# Patient Record
Sex: Female | Born: 1965 | Race: White | Hispanic: No | Marital: Married | Smoking: Former smoker
Health system: Southern US, Community
[De-identification: ages and names within clinical notes are randomized; demographics above are authoritative.]

## PROBLEM LIST (undated history)

## (undated) DIAGNOSIS — K219 Gastro-esophageal reflux disease without esophagitis: Secondary | ICD-10-CM

## (undated) DIAGNOSIS — F419 Anxiety disorder, unspecified: Secondary | ICD-10-CM

## (undated) DIAGNOSIS — S129XXA Fracture of neck, unspecified, initial encounter: Secondary | ICD-10-CM

## (undated) DIAGNOSIS — I10 Essential (primary) hypertension: Secondary | ICD-10-CM

## (undated) DIAGNOSIS — N83209 Unspecified ovarian cyst, unspecified side: Secondary | ICD-10-CM

## (undated) HISTORY — DX: Gastro-esophageal reflux disease without esophagitis: K21.9

## (undated) HISTORY — DX: Anxiety disorder, unspecified: F41.9

## (undated) HISTORY — PX: TUBAL LIGATION: SHX77

## (undated) HISTORY — PX: COLONOSCOPY: SHX174

## (undated) HISTORY — PX: ESOPHAGOGASTRODUODENOSCOPY: SHX1529

## (undated) HISTORY — DX: Fracture of neck, unspecified, initial encounter: S12.9XXA

## (undated) HISTORY — PX: CARPAL TUNNEL RELEASE: SHX101

## (undated) HISTORY — PX: OTHER SURGICAL HISTORY: SHX169

## (undated) HISTORY — DX: Unspecified ovarian cyst, unspecified side: N83.209

---

## 1994-07-07 LAB — HM DEXA SCAN

## 2009-02-12 ENCOUNTER — Encounter: Admission: RE | Admit: 2009-02-12 | Discharge: 2009-02-12 | Payer: Self-pay | Admitting: Family Medicine

## 2011-05-19 DIAGNOSIS — I729 Aneurysm of unspecified site: Secondary | ICD-10-CM | POA: Insufficient documentation

## 2011-07-29 ENCOUNTER — Emergency Department (INDEPENDENT_AMBULATORY_CARE_PROVIDER_SITE_OTHER)
Admission: EM | Admit: 2011-07-29 | Discharge: 2011-07-29 | Disposition: A | Discharge status: Home / Self Care | Payer: BC Managed Care – PPO | Source: Home / Self Care | Attending: Emergency Medicine | Admitting: Emergency Medicine

## 2011-07-29 ENCOUNTER — Encounter: Payer: Self-pay | Admitting: *Deleted

## 2011-07-29 DIAGNOSIS — J069 Acute upper respiratory infection, unspecified: Secondary | ICD-10-CM

## 2011-07-29 HISTORY — DX: Essential (primary) hypertension: I10

## 2011-07-29 MED ORDER — OSELTAMIVIR PHOSPHATE 75 MG PO CAPS: 75.0000 mg | ORAL_CAPSULE | Freq: Two times a day (BID) | ORAL | Status: AC

## 2011-07-29 NOTE — ED Notes (Signed)
Pt c/o fatigue, low grade fever, and chills x 1 day. She did not receive a flu vaccine. 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

## 2011-07-29 NOTE — ED Provider Notes (Signed)
History     CSN: [id number]  Arrival date & time 07/29/11  1713   First MD Initiated Contact with Patient 07/29/11 1716      No chief complaint on file.   (Consider location/radiation/quality/duration/timing/severity/associated sxs/prior treatment) HPI Emily Ponce is a 46 y.o. female who complains of onset of cold symptoms for 1 days.  No sore throat +cough No pleuritic pain No wheezing + nasal congestion No post-nasal drainage No sinus pain/pressure No chest congestion No itchy/red eyes No earache No hemoptysis No SOB + chills/sweats + fever No nausea No vomiting No abdominal pain No diarrhea No skin rashes + fatigue + myalgias No headache    No past medical history on file.  No past surgical history on file.  No family history on file.  History  Substance Use Topics  . Smoking status: Not on file  . Smokeless tobacco: Not on file  . Alcohol Use: Not on file    OB History    No data available      Review of Systems  Allergies  Review of patient's allergies indicates not on file.  Home Medications  No current outpatient prescriptions on file.  There were no vitals taken for this visit.  Physical Exam  Nursing note and vitals reviewed. Constitutional: She is oriented to person, place, and time. She appears well-developed and well-nourished.  HENT:  Head: Normocephalic and atraumatic.  Right Ear: Tympanic membrane, external ear and ear canal normal.  Left Ear: Tympanic membrane, external ear and ear canal normal.  Nose: Mucosal edema and rhinorrhea present.  Mouth/Throat: Posterior oropharyngeal erythema present. No oropharyngeal exudate or posterior oropharyngeal edema.  Eyes: No scleral icterus.  Neck: Neck supple.  Cardiovascular: Regular rhythm and normal heart sounds.   Pulmonary/Chest: Effort normal and breath sounds normal. No respiratory distress.  Neurological: She is alert and oriented to person, place, and time.  Skin: Skin is warm and  dry.  Psychiatric: She has a normal mood and affect. Her speech is normal.    ED Course  Procedures (including critical care time)  Labs Reviewed - No data to display No results found.   No diagnosis found.    MDM  1)  Tamiflu but advised her that it likely will not work. I also gave her a note for work tomorrow. 2)  Use nasal saline solution (over the counter) at least 3 times a day. 3)  Use over the counter decongestants like Zyrtec-D every 12 hours as needed to help with congestion.  If you have hypertension, do not take medicines with sudafed.  4)  Can take tylenol every 6 hours or motrin every 8 hours for pain or fever. 5)  Follow up with your primary doctor if no improvement in 5-7 days, sooner if increasing pain, fever, or new symptoms.     Lily Kocher, MD 07/29/11 [phone]

## 2012-07-17 DIAGNOSIS — G43909 Migraine, unspecified, not intractable, without status migrainosus: Secondary | ICD-10-CM | POA: Insufficient documentation

## 2013-01-08 ENCOUNTER — Emergency Department (INDEPENDENT_AMBULATORY_CARE_PROVIDER_SITE_OTHER): Payer: BC Managed Care – PPO

## 2013-01-08 ENCOUNTER — Emergency Department
Admission: EM | Admit: 2013-01-08 | Discharge: 2013-01-08 | Disposition: A | Discharge status: Home / Self Care | Payer: BC Managed Care – PPO | Source: Home / Self Care | Attending: Family Medicine | Admitting: Family Medicine

## 2013-01-08 DIAGNOSIS — M76829 Posterior tibial tendinitis, unspecified leg: Secondary | ICD-10-CM

## 2013-01-08 DIAGNOSIS — L089 Local infection of the skin and subcutaneous tissue, unspecified: Secondary | ICD-10-CM

## 2013-01-08 DIAGNOSIS — M7661 Achilles tendinitis, right leg: Secondary | ICD-10-CM

## 2013-01-08 DIAGNOSIS — M7660 Achilles tendinitis, unspecified leg: Secondary | ICD-10-CM

## 2013-01-08 DIAGNOSIS — M76821 Posterior tibial tendinitis, right leg: Secondary | ICD-10-CM

## 2013-01-08 DIAGNOSIS — M79609 Pain in unspecified limb: Secondary | ICD-10-CM

## 2013-01-08 MED ORDER — MELOXICAM 15 MG PO TABS: 15.0000 mg | ORAL_TABLET | Freq: Every day | ORAL | Status: DC

## 2013-01-08 MED ORDER — HYDROCODONE-ACETAMINOPHEN 5-325 MG PO TABS: ORAL_TABLET | ORAL | Status: DC

## 2013-01-08 MED ORDER — DOXYCYCLINE HYCLATE 100 MG PO CAPS: 100.0000 mg | ORAL_CAPSULE | Freq: Two times a day (BID) | ORAL | Status: DC

## 2013-01-08 NOTE — ED Provider Notes (Signed)
History    CSN: [id number] Arrival date & time 01/08/13  0935  First MD Initiated Contact with Patient 01/08/13 1030     Chief Complaint  Patient presents with  . Foot Pain    x 1 day  . Insect Bite    x 1 month ago     HPI Comments: Patient presents with two complaints: 1)  She used a push mower to [employer] a large lawn yesterday and afterwards noted pain in her right foot arch; the pain radiates into her right medial leg. 2)  About a month ago she had an insect bite to her left shoulder.  There remains a tender bump at the bite site.  No fevers, chills, and sweats.  She feels well otherwise.  Patient is a 47 y.o. female presenting with lower extremity pain. The history is provided by the patient.  Foot Pain This is a new problem. The current episode started yesterday. The problem occurs constantly. The problem has been gradually worsening. Associated symptoms comments: nne. The symptoms are aggravated by walking. Nothing relieves the symptoms. Treatments tried: NSAIDs. The treatment provided no relief.   Past Medical History  Diagnosis Date  . Hypertension    History reviewed. No pertinent past surgical history. Family History  Problem Relation Age of Onset  . Heart attack Mother   . Alzheimer's disease Father    History  Substance Use Topics  . Smoking status: Former Games developer  . Smokeless tobacco: Not on file  . Alcohol Use: Yes   OB History   Grav Para Term Preterm Abortions TAB SAB Ect Mult Living                 Review of Systems  All other systems reviewed and are negative.    Allergies  Review of patient's allergies indicates no known allergies.  Home Medications   Current Outpatient Rx  Name  Route  Sig  Dispense  Refill  . atenolol (TENORMIN) 50 MG tablet   Oral   Take 50 mg by mouth daily.         . hydrochlorothiazide (HYDRODIURIL) 25 MG tablet   Oral   Take 25 mg by mouth daily.         Marland Kitchen doxycycline (VIBRAMYCIN) 100 MG capsule   Oral   Take  1 capsule (100 mg total) by mouth 2 (two) times daily.   14 capsule   0   . meloxicam (MOBIC) 15 MG tablet   Oral   Take 1 tablet (15 mg total) by mouth daily. Take with food each morning   15 tablet   0    BP 106/69  Pulse 70  Temp(Src) 98.1 F (36.7 C) (Oral)  Ht 5\' 5"  (1.651 m)  Wt 150 lb (68.04 kg)  BMI 24.96 kg/m2  SpO2 98% Physical Exam  Nursing note and vitals reviewed. Constitutional: She is oriented to person, place, and time. She appears well-developed and well-nourished. No distress.  HENT:  Head: Normocephalic and atraumatic.  Mouth/Throat: Oropharynx is clear and moist.  Eyes: Conjunctivae are normal. Pupils are equal, round, and reactive to light.  Cardiovascular: Normal heart sounds.   Pulmonary/Chest: Breath sounds normal.  Musculoskeletal: Normal range of motion. She exhibits tenderness.       Right foot: She exhibits tenderness and bony tenderness. She exhibits normal range of motion, no swelling, normal capillary refill, no crepitus, no deformity and no laceration.       Feet:  Right foot/ankle has tenderness  in the medial arch area as note on diagram.  There is tenderness just posterior to the medial malleolus, and tenderness over insertion of achilles tendon  Lymphadenopathy:    She has no cervical adenopathy.  Neurological: She is alert and oriented to person, place, and time.  Skin: Skin is warm and dry. Lesion noted.     On the left shoulder over deltoid is an 8mm erythematous, slightly raised indurated lesion that is not fluctuant.  Minimal tenderness.      ED Course  Procedures  none  Dg Foot Complete Right  01/08/2013   *RADIOLOGY REPORT*  Clinical Data: Foot pain with tenderness in the medial margin of the foot.  RIGHT FOOT COMPLETE - 3+ VIEW  Comparison: None.  Findings: Minimal spurring laterally of the first metatarsal head. Accessory navicular noted.  No malalignment at the Lisfranc joint. No fracture observed.  Accessory ossification  center noted dorsal lateral to the base of the first metatarsal.  IMPRESSION:  1.  No acute findings.  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  If symptoms persist despite conservative therapy, MRI followup may be warranted.   Original Report Authenticated By: Gaylyn Rong, M.D.   1. Insect bite of shoulder, infected, left, initial encounter   2. Posterior tibial tendonitis, right   3. Achilles tendonitis, right     MDM  Begin doxycycline for insect bite cellulitis Begin Mobic.  Rx for Lortab at bedtime. Wear well-fitting shoes with arch supports.  Apply ice pack several times daily.  Begin range of motion and stretching exercises as per instruction sheets ([facility] information and instruction handout given)  Followup with [facility] if not improving about two weeks.   Lattie Haw, MD 01/13/13 [phone]

## 2013-01-08 NOTE — ED Notes (Signed)
Emily Ponce was mowing her yard yesterday and her right foot began to hurt. She took Aleve and later Advil without relief. Denies injury.   She had a insect bite on her left shoulder 1 month ago and the area is still bothering her.

## 2013-01-13 ENCOUNTER — Telehealth: Payer: Self-pay | Admitting: Emergency Medicine

## 2013-12-26 ENCOUNTER — Emergency Department (HOSPITAL_BASED_OUTPATIENT_CLINIC_OR_DEPARTMENT_OTHER): Payer: BC Managed Care – PPO

## 2013-12-26 ENCOUNTER — Emergency Department
Admission: EM | Admit: 2013-12-26 | Discharge: 2013-12-26 | Disposition: A | Discharge status: Home / Self Care | Payer: BC Managed Care – PPO | Source: Home / Self Care | Attending: Emergency Medicine | Admitting: Emergency Medicine

## 2013-12-26 ENCOUNTER — Emergency Department (INDEPENDENT_AMBULATORY_CARE_PROVIDER_SITE_OTHER): Payer: BC Managed Care – PPO

## 2013-12-26 ENCOUNTER — Encounter: Payer: Self-pay | Admitting: Emergency Medicine

## 2013-12-26 DIAGNOSIS — S60212A Contusion of left wrist, initial encounter: Secondary | ICD-10-CM

## 2013-12-26 DIAGNOSIS — M79609 Pain in unspecified limb: Secondary | ICD-10-CM

## 2013-12-26 DIAGNOSIS — M25539 Pain in unspecified wrist: Secondary | ICD-10-CM

## 2013-12-26 DIAGNOSIS — S60222A Contusion of left hand, initial encounter: Secondary | ICD-10-CM

## 2013-12-26 DIAGNOSIS — S60219A Contusion of unspecified wrist, initial encounter: Secondary | ICD-10-CM

## 2013-12-26 DIAGNOSIS — S6000XA Contusion of unspecified finger without damage to nail, initial encounter: Secondary | ICD-10-CM

## 2013-12-26 MED ORDER — MELOXICAM 7.5 MG PO TABS
ORAL_TABLET | ORAL | Status: DC
Start: 2013-12-26 — End: 2014-04-24

## 2013-12-26 MED ORDER — HYDROCODONE-ACETAMINOPHEN 5-325 MG PO TABS: 1.0000 | ORAL_TABLET | ORAL | Status: DC | PRN

## 2013-12-26 NOTE — ED Notes (Signed)
Pt c/o LT hand pain and swelling after typing more than usual at work x 3 days ago, then helping move her washing machine at home x 2 days ago. She took Aleve over the weekend with no relief.

## 2013-12-26 NOTE — ED Provider Notes (Signed)
CSN: [id number]     Arrival date & time 12/26/13  0623 History   First MD Initiated Contact with Patient 12/26/13 0825     Chief Complaint  Patient presents with  . Hand Pain  Pt c/o LT hand pain and swelling after typing more than usual at work x 3 days ago, then helping move her washing machine at home x 2 days ago. She took Aleve over the weekend with no relief.   Patient is a 48 y.o. female presenting with hand injury. The history is provided by the patient.  Hand Injury Location:  Hand Injury: yes   Hand location:  L hand Pain details:    Quality:  Sharp, dull and shooting   Radiates to: Left wrist.   Severity:  Severe   Progression:  Unchanged Chronicity:  New (History of left carpal tunnel surgery in the past) Relieved by:  Rest Worsened by:  Movement Ineffective treatments: Aleve. Associated symptoms: decreased range of motion and swelling   Associated symptoms: no back pain, no fever, no muscle weakness, no neck pain, no numbness and no tingling     Past Medical History  Diagnosis Date  . Hypertension    Past Surgical History  Procedure Laterality Date  . Carpal tunnel release Left   . Esure      Family History  Problem Relation Age of Onset  . Heart attack Mother   . Hypertension Mother   . Alzheimer's disease Father   . Hypertension Father    History  Substance Use Topics  . Smoking status: Former Research scientist (life sciences)  . Smokeless tobacco: Not on file  . Alcohol Use: Yes   OB History   Grav Para Term Preterm Abortions TAB SAB Ect Mult Living                 Review of Systems  Constitutional: Negative for fever.  Musculoskeletal: Negative for back pain and neck pain.  All other systems reviewed and are negative.   Allergies  Review of patient's allergies indicates no known allergies.  Home Medications   Prior to Admission medications   Medication Sig Start Date End Date Taking? Authorizing Provider  atenolol (TENORMIN) 50 MG tablet Take 50 mg by mouth  daily.    Historical Provider, MD  hydrochlorothiazide (HYDRODIURIL) 25 MG tablet Take 25 mg by mouth daily.    Historical Provider, MD  HYDROcodone-acetaminophen (NORCO/VICODIN) 5-325 MG per tablet Take 1-2 tablets by mouth every 4 (four) hours as needed for severe pain. Take with food. 12/26/13   Jacqulyn Cane, MD  meloxicam (MOBIC) 7.5 MG tablet Take 1 twice a day as needed for pain. Take with food. (Do not take with any other NSAID.) 12/26/13   Jacqulyn Cane, MD   BP 129/77  Pulse 65  Temp(Src) 98 F (36.7 C) (Oral)  Resp 16  Ht 5\' 5"  (1.651 m)  Wt 158 lb (71.668 kg)  BMI 26.29 kg/m2  SpO2 100%  LMP 12/19/2013 Physical Exam  Nursing note and vitals reviewed. Constitutional: She is oriented to person, place, and time. She appears well-developed and well-nourished. No distress.  HENT:  Head: Normocephalic and atraumatic.  Eyes: Conjunctivae and EOM are normal. Pupils are equal, round, and reactive to light. No scleral icterus.  Neck: Normal range of motion.  Cardiovascular: Normal rate.   Pulmonary/Chest: Effort normal.  Abdominal: She exhibits no distension.  Musculoskeletal:       Left wrist: She exhibits decreased range of motion and bony tenderness.  Left hand: She exhibits decreased range of motion, bony tenderness and swelling. She exhibits normal capillary refill and no laceration. Normal sensation noted. Normal strength noted.       Hands: Left hand and wrist: Mild swelling, severe tenderness as depicted. Minimal bruising. Pain exacerbated by flexion. Tendons intact.  Tinel's sign mildly positive. Phalen's sign mildly positive . Negative Flick test.  Radial pulse normal. No tenderness left thumb or radial aspect of hand/wrist.  Neurological: She is alert and oriented to person, place, and time.  Skin: Skin is warm.  Psychiatric: She has a normal mood and affect.    ED Course  Procedures (including critical care time) Labs Review Labs Reviewed - No data to  display  Imaging Review Dg Wrist Complete Left  12/26/2013   CLINICAL DATA:  Left wrist and hand pain.  EXAM: LEFT WRIST - COMPLETE 3+ VIEW  COMPARISON:  Hand series performed today  FINDINGS: There is no evidence of fracture or dislocation. There is no evidence of arthropathy or other focal bone abnormality. Soft tissues are unremarkable.  IMPRESSION: Negative.   Electronically Signed   By: Rolm Baptise M.D.   On: 12/26/2013 09:13   Dg Hand Complete Left  12/26/2013   CLINICAL DATA:  Left wrist and hand pain.  EXAM: LEFT HAND - COMPLETE 3+ VIEW  COMPARISON:  Wrist series performed today.  FINDINGS: There is no evidence of fracture or dislocation. There is no evidence of arthropathy or other focal bone abnormality. Soft tissues are unremarkable.  IMPRESSION: Negative.   Electronically Signed   By: Rolm Baptise M.D.   On: 12/26/2013 09:12     MDM   1. Contusion, wrist, left, initial encounter   2. Contusion of left hand, initial encounter    Reviewed with patient that x-rays of left wrist and left hand are within normal limits. No acute abnormalities. No fracture or dislocation. Clinically, she likely has contusion and sprain of left wrist and left hand, likely from injury 2 days ago while she was helping move a washing machine at home. She has a history of left carpal tunnel surgery 2 years ago, and residual scar tissue may be a factor. She does not have definitive physical findings for diagnosis of carpal tunnel syndrome.  Treatment options discussed, as well as risks, benefits, alternatives. Patient voiced understanding and agreement with the following plans: Ace bandage applied left hand and wrist. Left wrist splint applied . No lifting with left hand or wrist for one week. Her job requires keyboarding frequently, so I advised limiting keyboarding for the next week. Prescribed Mobic for moderate pain. Vicodin when necessary severe pain. Follow-up with your orthopedist in 5-7 days if  not improving, or sooner if symptoms become worse. Precautions discussed. Red flags discussed. Questions invited and answered. Patient voiced understanding and agreement.     Jacqulyn Cane, MD 12/26/13 1013

## 2014-01-20 DIAGNOSIS — M542 Cervicalgia: Secondary | ICD-10-CM

## 2014-01-20 DIAGNOSIS — G8929 Other chronic pain: Secondary | ICD-10-CM | POA: Insufficient documentation

## 2014-04-24 ENCOUNTER — Ambulatory Visit (INDEPENDENT_AMBULATORY_CARE_PROVIDER_SITE_OTHER): Payer: BC Managed Care – PPO | Admitting: Family Medicine

## 2014-04-24 ENCOUNTER — Encounter: Payer: Self-pay | Admitting: Family Medicine

## 2014-04-24 VITALS — BP 122/76 | HR 67 | Ht 65.0 in | Wt 160.0 lb

## 2014-04-24 DIAGNOSIS — I10 Essential (primary) hypertension: Secondary | ICD-10-CM | POA: Insufficient documentation

## 2014-04-24 DIAGNOSIS — F411 Generalized anxiety disorder: Secondary | ICD-10-CM | POA: Insufficient documentation

## 2014-04-24 DIAGNOSIS — R519 Headache, unspecified: Secondary | ICD-10-CM | POA: Insufficient documentation

## 2014-04-24 DIAGNOSIS — I671 Cerebral aneurysm, nonruptured: Secondary | ICD-10-CM

## 2014-04-24 DIAGNOSIS — R51 Headache: Secondary | ICD-10-CM

## 2014-04-24 DIAGNOSIS — M542 Cervicalgia: Secondary | ICD-10-CM

## 2014-04-24 DIAGNOSIS — G8929 Other chronic pain: Secondary | ICD-10-CM | POA: Insufficient documentation

## 2014-04-24 MED ORDER — ALPRAZOLAM 0.5 MG PO TABS: 0.2500 mg | ORAL_TABLET | Freq: Every evening | ORAL | Status: DC | PRN

## 2014-04-24 MED ORDER — HYDROCODONE-ACETAMINOPHEN 5-325 MG PO TABS: 1.0000 | ORAL_TABLET | ORAL | Status: DC | PRN
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
## 2014-05-23 ENCOUNTER — Ambulatory Visit (INDEPENDENT_AMBULATORY_CARE_PROVIDER_SITE_OTHER): Payer: BC Managed Care – PPO | Admitting: Family Medicine

## 2014-05-23 ENCOUNTER — Encounter: Payer: Self-pay | Admitting: Family Medicine

## 2014-05-23 VITALS — BP 112/69 | HR 70 | Temp 97.7°F | Wt 161.0 lb

## 2014-05-23 DIAGNOSIS — G8929 Other chronic pain: Secondary | ICD-10-CM

## 2014-05-23 DIAGNOSIS — H6981 Other specified disorders of Eustachian tube, right ear: Secondary | ICD-10-CM | POA: Diagnosis not present

## 2014-05-23 DIAGNOSIS — M542 Cervicalgia: Secondary | ICD-10-CM | POA: Diagnosis not present

## 2014-05-23 MED ORDER — PREDNISONE 20 MG PO TABS: ORAL_TABLET | ORAL | Status: AC

## 2014-05-23 MED ORDER — HYDROCODONE-ACETAMINOPHEN 5-325 MG PO TABS: 1.0000 | ORAL_TABLET | ORAL | Status: DC | PRN

## 2014-05-23 NOTE — Progress Notes (Signed)
CC: Emily Ponce is a 48 y.o. female is here for Ear Pain and requests refill of hydrocodone   Subjective: HPI:  Complains of right ear pain that came on suddenly while descending on airplane. Pain was sharp, 10 out of 10 in severity and nonradiating. Since then she's had mild discomfort in the right ear with decreased subjective hearing. She also describes congestion in the right ear and a bubbling sensation in the ear when opening and closing her jaw. No interventions as of yet. She's never had this before. She denies any other motor or sensory disturbances. Currently nothing particularly makes it better or worse. Denies nasal congestion sinus pressure, sore throat, fevers or chills  Requesting refills of hydrocodone for her chronic neck pain. She denies any new radiation to the pain. Denies any character frequency or severity change to the pain. It is still localized in the midline of the middle of the neck and described today as a stiffness that is completely alleviated by hydrocodone   Review Of Systems Outlined In HPI  Past Medical History  Diagnosis Date  . Hypertension     Past Surgical History  Procedure Laterality Date  . Carpal tunnel release Left   . Esure      Family History  Problem Relation Age of Onset  . Heart attack Mother   . Hypertension Mother   . Alzheimer's disease Father   . Hypertension Father     History   Social History  . Marital Status: Married    Spouse Name: N/A    Number of Children: N/A  . Years of Education: N/A   Occupational History  . Not on file.   Social History Main Topics  . Smoking status: Former Smoker    Quit date: 04/24/2010  . Smokeless tobacco: Not on file  . Alcohol Use: Yes     Comment: occasional  . Drug Use: No  . Sexual Activity:    Partners: Male   Other Topics Concern  . Not on file   Social History Narrative     Objective: BP 112/69 mmHg  Pulse 70  Temp(Src) 97.7 F (36.5 C) (Oral)  Wt 161 lb  (73.029 kg)  LMP 03/27/2014  General: Alert and Oriented, No Acute Distress HEENT: Pupils equal, round, reactive to light. Conjunctivae clear.  External ears unremarkable, canals clear with intact TMs with appropriate landmarks. left Middle ear appears open without effusion right middle ear has a trace amount of blood in the 9:00 position and a mild serous effusion. Pink inferior turbinates.  Moist mucous membranes, pharynx without inflammation nor lesions.  Neck supple without palpable lymphadenopathy nor abnormal masses. Lungs: clear andcomfortable work of breathing  Extremities: No peripheral edema.  Strong peripheral pulses.  Mental Status: No depression, anxiety, nor agitation. Skin: Warm and dry. Rinne test normal bilaterally, Weber test lateralizes to the right  Assessment & Plan: Emily Ponce was seen today for ear pain and requests refill of hydrocodone.  Diagnoses and associated orders for this visit:  Chronic neck pain - HYDROcodone-acetaminophen (NORCO/VICODIN) 5-325 MG per tablet; Take 1-2 tablets by mouth every 4 (four) hours as needed for severe pain. Take with food.  Eustachian tube dysfunction, right - predniSONE (DELTASONE) 20 MG tablet; Three tabs at once daily for five days.    Chronic neck pain: Controlled continue as needed Vicodin Eustachian tube dysfunction: Suspect a sudden pressure change with already present eustachian tube dysfunction caused her severe pain. Begin prednisone for 5 days and if no  better on Monday call for referral to ear nose and throat  No Follow-up on file.

## 2014-06-22 ENCOUNTER — Other Ambulatory Visit: Payer: Self-pay | Admitting: *Deleted

## 2014-06-22 DIAGNOSIS — M542 Cervicalgia: Principal | ICD-10-CM

## 2014-06-22 DIAGNOSIS — G8929 Other chronic pain: Secondary | ICD-10-CM

## 2014-06-22 LAB — HM COLONOSCOPY

## 2014-06-22 MED ORDER — HYDROCODONE-ACETAMINOPHEN 5-325 MG PO TABS: 1.0000 | ORAL_TABLET | ORAL | Status: DC | PRN

## 2014-06-27 ENCOUNTER — Encounter: Payer: Self-pay | Admitting: Family Medicine

## 2014-07-06 ENCOUNTER — Encounter: Payer: Self-pay | Admitting: Family Medicine

## 2014-07-06 DIAGNOSIS — Z299 Encounter for prophylactic measures, unspecified: Secondary | ICD-10-CM | POA: Insufficient documentation

## 2014-07-25 ENCOUNTER — Encounter: Payer: Self-pay | Admitting: Family Medicine

## 2014-07-25 ENCOUNTER — Ambulatory Visit (INDEPENDENT_AMBULATORY_CARE_PROVIDER_SITE_OTHER): Payer: BLUE CROSS/BLUE SHIELD | Admitting: Family Medicine

## 2014-07-25 VITALS — BP 112/63 | Ht 65.0 in | Wt 164.0 lb

## 2014-07-25 DIAGNOSIS — G8929 Other chronic pain: Secondary | ICD-10-CM

## 2014-07-25 DIAGNOSIS — M542 Cervicalgia: Secondary | ICD-10-CM

## 2014-07-25 DIAGNOSIS — Z23 Encounter for immunization: Secondary | ICD-10-CM | POA: Diagnosis not present

## 2014-07-25 DIAGNOSIS — Z0000 Encounter for general adult medical examination without abnormal findings: Secondary | ICD-10-CM

## 2014-07-25 LAB — COMPLETE METABOLIC PANEL WITH GFR
ALBUMIN: 4.2 g/dL (ref 3.5–5.2)
ALK PHOS: 54 U/L (ref 39–117)
ALT: 19 U/L (ref 0–35)
AST: 18 U/L (ref 0–37)
BUN: 12 mg/dL (ref 6–23)
CHLORIDE: 101 meq/L (ref 96–112)
CO2: 28 mEq/L (ref 19–32)
Calcium: 9.3 mg/dL (ref 8.4–10.5)
Creat: 0.8 mg/dL (ref 0.50–1.10)
GFR, Est Non African American: 86 mL/min
Glucose, Bld: 88 mg/dL (ref 70–99)
Potassium: 4.2 mEq/L (ref 3.5–5.3)
SODIUM: 139 meq/L (ref 135–145)
TOTAL PROTEIN: 7 g/dL (ref 6.0–8.3)
Total Bilirubin: 0.5 mg/dL (ref 0.2–1.2)

## 2014-07-25 LAB — LIPID PANEL
Cholesterol: 193 mg/dL (ref 0–200)
HDL: 51 mg/dL (ref >39)
LDL CALC: 116 mg/dL — AB (ref 0–99)
Total CHOL/HDL Ratio: 3.8 Ratio
Triglycerides: 128 mg/dL (ref <150)
VLDL: 26 mg/dL (ref 0–40)

## 2014-07-25 LAB — CBC
HCT: 41.1 % (ref 36.0–46.0)
Hemoglobin: 14.1 g/dL (ref 12.0–15.0)
MCH: 31.8 pg (ref 26.0–34.0)
MCHC: 34.3 g/dL (ref 30.0–36.0)
MCV: 92.8 fL (ref 78.0–100.0)
MPV: 10.8 fL (ref 8.6–12.4)
PLATELETS: 238 10*3/uL (ref 150–400)
RBC: 4.43 MIL/uL (ref 3.87–5.11)
RDW: 13 % (ref 11.5–15.5)
WBC: 7.5 10*3/uL (ref 4.0–10.5)

## 2014-07-25 LAB — TSH: TSH: 1.555 u[IU]/mL (ref 0.350–4.500)

## 2014-07-25 MED ORDER — HYDROCODONE-ACETAMINOPHEN 5-325 MG PO TABS: 1.0000 | ORAL_TABLET | ORAL | Status: DC | PRN

## 2014-07-25 NOTE — Patient Instructions (Signed)
Dr. Alazne Quant's General Advice Following Your Complete Physical Exam  The Benefits of Regular Exercise: Unless you suffer from an uncontrolled cardiovascular condition, studies strongly suggest that regular exercise and physical activity will add to both the quality and length of your life.  The World Health Organization recommends 150 minutes of moderate intensity aerobic activity every week.  This is best split over 3-4 days a week, and can be as simple as a brisk walk for just over 35 minutes "most days of the week".  This type of exercise has been shown to lower LDL-Cholesterol, lower average blood sugars, lower blood pressure, lower cardiovascular disease risk, improve memory, and increase one's overall sense of wellbeing.  The addition of anaerobic (or "strength training") exercises offers additional benefits including but not limited to increased metabolism, prevention of osteoporosis, and improved overall cholesterol levels.  How Can I Strive For A Low-Fat Diet?: Current guidelines recommend that 25-35 percent of your daily energy (food) intake should come from fats.  One might ask how can this be achieved without having to dissect each meal on a daily basis?  Switch to skim or 1% milk instead of whole milk.  Focus on lean meats such as ground turkey, fresh fish, baked chicken, and lean cuts of beef as your source of dietary protein.  Limit saturated fat consumption to less than 10% of your daily caloric intake.  Limit trans fatty acid consumption primarily by limiting synthetic trans fats such as partially hydrogenated oils (Ex: fried fast foods).  Substitute olive or vegetable oil for solid fats where possible.  Moderation of Salt Intake: Provided you don't carry a diagnosis of congestive heart failure nor renal failure, I recommend a daily allowance of no more than 2300 mg of salt (sodium).  Keeping under this daily goal is associated with a decreased risk of cardiovascular events, creeping  above it can lead to elevated blood pressures and increases your risk of cardiovascular events.  Milligrams (mg) of salt is listed on all nutrition labels, and your daily intake can add up faster than you think.  Most canned and frozen dinners can pack in over half your daily salt allowance in one meal.    Lifestyle Health Risks: Certain lifestyle choices carry specific health risks.  As you may already know, tobacco use has been associated with increasing one's risk of cardiovascular disease, pulmonary disease, numerous cancers, among many other issues.  What you may not know is that there are medications and nicotine replacement strategies that can more than double your chances of successfully quitting.  I would be thrilled to help manage your quitting strategy if you currently use tobacco products.  When it comes to alcohol use, I've yet to find an "ideal" daily allowance.  Provided an individual does not have a medical condition that is exacerbated by alcohol consumption, general guidelines determine "safe drinking" as no more than two standard drinks for a man or no more than one standard drink for a female per day.  However, much debate still exists on whether any amount of alcohol consumption is technically "safe".  My general advice, keep alcohol consumption to a minimum for general health promotion.  If you or others believe that alcohol, tobacco, or recreational drug use is interfering with your life, I would be happy to provide confidential counseling regarding treatment options.  General "Over The Counter" Nutrition Advice: Postmenopausal women should aim for a daily calcium intake of 1200 mg, however a significant portion of this might already be   provided by diets including milk, yogurt, cheese, and other dairy products.  Vitamin D has been shown to help preserve bone density, prevent fatigue, and has even been shown to help reduce falls in the elderly.  Ensuring a daily intake of 800 Units of  Vitamin D is a good place to start to enjoy the above benefits, we can easily check your Vitamin D level to see if you'd potentially benefit from supplementation beyond 800 Units a day.  Folic Acid intake should be of particular concern to women of childbearing age.  Daily consumption of 400-800 mcg of Folic Acid is recommended to minimize the chance of spinal cord defects in a fetus should pregnancy occur.    For many adults, accidents still remain one of the most common culprits when it comes to cause of death.  Some of the simplest but most effective preventitive habits you can adopt include regular seatbelt use, proper helmet use, securing firearms, and regularly testing your smoke and carbon monoxide detectors.  Lakechia Nay B. Aquita Simmering DO [facility] [address], [address] [place],  [address] Phone: [phone]  

## 2014-07-25 NOTE — Progress Notes (Signed)
CC: Emily Ponce is a 49 y.o. female is here for Annual Exam   Subjective: HPI:  Colonoscopy: 2015 with clearance of 10 years Papsmear: 07/12/2012 repeat 2017 Mammogram: BI RADS 06 Jun 2014  Influenza Vaccine: declined Pneumovax: No current indication Td/Tdap: Overdue will receive booster today Zoster: (Start 49 yo)   Presents for complete physical exam her only acute complaint is irregular periods over the last 3 months. Anticipated December menses did not occur. She did have a period since January started however it was only 5 days long shorter than her typical 7 days of active bleeding. No other genitourinary complaints  Review of Systems - General ROS: negative for - chills, fever, night sweats, weight gain or weight loss Ophthalmic ROS: negative for - decreased vision Psychological ROS: negative for - anxiety or depression ENT ROS: negative for - hearing change, nasal congestion, tinnitus or allergies Hematological and Lymphatic ROS: negative for - bleeding problems, bruising or swollen lymph nodes Breast ROS: negative Respiratory ROS: no cough, shortness of breath, or wheezing Cardiovascular ROS: no chest pain or dyspnea on exertion Gastrointestinal ROS: no abdominal pain, change in bowel habits, or black or bloody stools Genito-Urinary ROS: negative for - genital discharge, genital ulcers, incontinence Musculoskeletal ROS: negative for - joint pain or muscle pain Neurological ROS: negative for - headaches or memory loss Dermatological ROS: negative for lumps, mole changes, rash and skin lesion changes  Past Medical History  Diagnosis Date  . Hypertension     Past Surgical History  Procedure Laterality Date  . Carpal tunnel release Left   . Esure      Family History  Problem Relation Age of Onset  . Heart attack Mother   . Hypertension Mother   . Alzheimer's disease Father   . Hypertension Father     History   Social History  . Marital Status: Married   Spouse Name: N/A    Number of Children: N/A  . Years of Education: N/A   Occupational History  . Not on file.   Social History Main Topics  . Smoking status: Former Smoker    Quit date: 04/24/2010  . Smokeless tobacco: Not on file  . Alcohol Use: Yes     Comment: occasional  . Drug Use: No  . Sexual Activity:    Partners: Male   Other Topics Concern  . Not on file   Social History Narrative     Objective: BP 112/63 mmHg  Ht 5\' 5"  (1.651 m)  Wt 164 lb (74.39 kg)  BMI 27.29 kg/m2  General: No Acute Distress HEENT: Atraumatic, normocephalic, conjunctivae normal without scleral icterus.  No nasal discharge, hearing grossly intact, TMs with good landmarks bilaterally with no middle ear abnormalities, posterior pharynx clear without oral lesions. Neck: Supple, trachea midline, no cervical nor supraclavicular adenopathy. Pulmonary: Clear to auscultation bilaterally without wheezing, rhonchi, nor rales. Cardiac: Regular rate and rhythm.  No murmurs, rubs, nor gallops. No peripheral edema.  2+ peripheral pulses bilaterally. Abdomen: Bowel sounds normal.  No masses.  Non-tender without rebound.  Negative Murphy's sign. GU: Declined  MSK: Grossly intact, no signs of weakness.  Full strength throughout upper and lower extremities.  Full ROM in upper and lower extremities.  No midline spinal tenderness. Neuro: Gait unremarkable, CN II-XII grossly intact.  C5-C6 Reflex 2/4 Bilaterally, L4 Reflex 2/4 Bilaterally.  Cerebellar function intact. Skin: No rashes. Psych: Alert and oriented to person/place/time.  Thought process normal. No anxiety/depression.   Assessment & Plan: Tayli was  seen today for annual exam.  Diagnoses and associated orders for this visit:  Annual physical exam - COMPLETE METABOLIC PANEL WITH GFR - Lipid panel - CBC - TSH  Chronic neck pain - HYDROcodone-acetaminophen (NORCO/VICODIN) 5-325 MG per tablet; Take 1-2 tablets by mouth every 4 (four) hours as needed  for severe pain. Take with food.   Healthy lifestyle interventions including but not limited to regular exercise, a healthy low fat diet, moderation of salt intake, the dangers of tobacco/alcohol/recreational drug use, nutrition supplementation, and accident avoidance were discussed with the patient and a handout was provided for future reference.  Checking TSH due to irregular menses but her period her that this is most likely due to perimenopause   Return in about 6 months (around 01/23/2015) for Blood Pressure Follow Up.

## 2014-07-25 NOTE — Addendum Note (Signed)
Addended by: Terance Hart on: 07/25/2014 09:41 AM   Modules accepted: Orders

## 2014-07-26 ENCOUNTER — Encounter: Payer: Self-pay | Admitting: Family Medicine

## 2014-07-26 DIAGNOSIS — E785 Hyperlipidemia, unspecified: Secondary | ICD-10-CM | POA: Insufficient documentation

## 2014-08-09 ENCOUNTER — Other Ambulatory Visit: Payer: Self-pay

## 2014-08-09 ENCOUNTER — Encounter: Payer: Self-pay | Admitting: Family Medicine

## 2014-08-09 NOTE — Telephone Encounter (Addendum)
Patient sent a MyChart message asking if for refills on her blood pressure medication. She wants 90 day sent to [facility] but a 30 day sent first to [facility]. Historical medications.

## 2014-08-10 MED ORDER — ATENOLOL 50 MG PO TABS: 50.0000 mg | ORAL_TABLET | Freq: Every day | ORAL | Status: DC

## 2014-08-10 MED ORDER — HYDROCHLOROTHIAZIDE 25 MG PO TABS: 25.0000 mg | ORAL_TABLET | Freq: Every day | ORAL | Status: DC

## 2014-08-10 NOTE — Telephone Encounter (Signed)
Requests have been fufilled

## 2014-08-23 ENCOUNTER — Telehealth: Payer: Self-pay | Admitting: Family Medicine

## 2014-08-23 DIAGNOSIS — M542 Cervicalgia: Principal | ICD-10-CM

## 2014-08-23 DIAGNOSIS — G8929 Other chronic pain: Secondary | ICD-10-CM

## 2014-08-24 MED ORDER — HYDROCODONE-ACETAMINOPHEN 5-325 MG PO TABS: 1.0000 | ORAL_TABLET | ORAL | Status: DC | PRN

## 2014-08-24 NOTE — Telephone Encounter (Signed)
Andrea, Rx placed in in-box ready for pickup/faxing.  

## 2014-08-24 NOTE — Telephone Encounter (Signed)
Pt picked rx up.

## 2014-09-04 ENCOUNTER — Other Ambulatory Visit: Payer: Self-pay | Admitting: Family Medicine

## 2014-09-20 ENCOUNTER — Other Ambulatory Visit: Payer: Self-pay | Admitting: Family Medicine

## 2014-09-21 ENCOUNTER — Encounter: Payer: Self-pay | Admitting: Family Medicine

## 2014-09-22 ENCOUNTER — Other Ambulatory Visit: Payer: Self-pay | Admitting: *Deleted

## 2014-09-22 DIAGNOSIS — M542 Cervicalgia: Principal | ICD-10-CM

## 2014-09-22 DIAGNOSIS — G8929 Other chronic pain: Secondary | ICD-10-CM

## 2014-09-22 MED ORDER — HYDROCODONE-ACETAMINOPHEN 5-325 MG PO TABS: 1.0000 | ORAL_TABLET | ORAL | Status: DC | PRN

## 2014-10-20 ENCOUNTER — Other Ambulatory Visit: Payer: Self-pay | Admitting: Family Medicine

## 2014-10-20 ENCOUNTER — Encounter: Payer: Self-pay | Admitting: Family Medicine

## 2014-10-23 ENCOUNTER — Other Ambulatory Visit: Payer: Self-pay | Admitting: *Deleted

## 2014-10-23 DIAGNOSIS — G8929 Other chronic pain: Secondary | ICD-10-CM

## 2014-10-23 DIAGNOSIS — M542 Cervicalgia: Principal | ICD-10-CM

## 2014-10-23 MED ORDER — HYDROCODONE-ACETAMINOPHEN 5-325 MG PO TABS: 1.0000 | ORAL_TABLET | ORAL | Status: DC | PRN

## 2014-11-17 ENCOUNTER — Ambulatory Visit (INDEPENDENT_AMBULATORY_CARE_PROVIDER_SITE_OTHER): Payer: BLUE CROSS/BLUE SHIELD | Admitting: Sports Medicine

## 2014-11-17 ENCOUNTER — Encounter: Payer: Self-pay | Admitting: Sports Medicine

## 2014-11-17 ENCOUNTER — Ambulatory Visit (INDEPENDENT_AMBULATORY_CARE_PROVIDER_SITE_OTHER): Payer: BLUE CROSS/BLUE SHIELD

## 2014-11-17 VITALS — BP 154/72 | HR 68 | Wt 161.0 lb

## 2014-11-17 DIAGNOSIS — M79671 Pain in right foot: Secondary | ICD-10-CM | POA: Diagnosis not present

## 2014-11-17 DIAGNOSIS — M7731 Calcaneal spur, right foot: Secondary | ICD-10-CM

## 2014-11-17 MED ORDER — MELOXICAM 15 MG PO TABS: ORAL_TABLET | ORAL | Status: DC

## 2014-11-17 NOTE — Assessment & Plan Note (Addendum)
Symptoms are suspicious for in arch strain. Symptoms are quite severe, this does not resolve a stress fracture which would present with dorsal metatarsal type pain. Noted intact plantar fascia without thickening on ultrasound, there was however hypoechoic change in the quadratus plantae muscle, and an injection was placed into the superficial fascial plane over the quadratus plantae with good resolution of symptoms. Crutches. Meloxicam. Return for custom molded orthotics.

## 2014-11-17 NOTE — Progress Notes (Signed)
   Subjective:    I'm seeing this patient as a consultation for:  Dr. Ileene Rubens  CC: Severe right foot pain  HPI: This is a pleasant 49 year old female, for the past several days she's had increasing pain on the plantar aspect of her right foot, she helped one of her children move into college, and had worsening pain, no other trauma. No constitutional symptoms. Pain does not radiate. No dorsal pain, no swelling. No bruising.  Past medical history, Surgical history, Family history not pertinant except as noted below, Social history, Allergies, and medications have been entered into the medical record, reviewed, and no changes needed.   Review of Systems: No headache, visual changes, nausea, vomiting, diarrhea, constipation, dizziness, abdominal pain, skin rash, fevers, chills, night sweats, weight loss, swollen lymph nodes, body aches, joint swelling, muscle aches, chest pain, shortness of breath, mood changes, visual or auditory hallucinations.   Objective:   General: Well Developed, well nourished, and in no acute distress.  Neuro/Psych: Alert and oriented x3, extra-ocular muscles intact, able to move all 4 extremities, sensation grossly intact. Skin: Warm and dry, no rashes noted.  Respiratory: Not using accessory muscles, speaking in full sentences, trachea midline.  Cardiovascular: Pulses palpable, no extremity edema. Abdomen: Does not appear distended. Right Foot: No visible erythema or swelling. Range of motion is full in all directions. Strength is 5/5 in all directions. No hallux valgus. Bilateral pes cavus No abnormal callus noted. No pain over the navicular prominence, or base of fifth metatarsal. No tenderness to palpation of the calcaneal insertion of plantar fascia. Exquisite tenderness to palpation deep into the plantar mid foot. I am able to reproduce her pain with resisted flexion of the toes. No pain at the Achilles insertion. No pain over the calcaneal bursa. No pain  of the retrocalcaneal bursa. No tenderness to palpation over the tarsals, metatarsals, or phalanges. No hallux rigidus or limitus. No tenderness palpation over interphalangeal joints. No pain with compression of the metatarsal heads. Neurovascularly intact distally.  Procedure: Real-time Ultrasound Guided Injection of right quadratus plantae fascial sheath. Device: GE Logiq E  Verbal informed consent obtained.  Time-out conducted.  Noted no overlying erythema, induration, or other signs of local infection.  Skin prepped in a sterile fashion.  Local anesthesia: Topical Ethyl chloride.  With sterile technique and under real time ultrasound guidance: Noted in intact and unthickened plantar fascia, I also noted some hypoechoic change with loss of the normal pin 8 structure of the quadratus Plantae muscle deep to the plantar fascia. 25-gauge needle was advanced to the superficial fascial plane of the quadratus plantae, and a total of 1 mL kenalog 40, 3 mL lidocaine was injected easily, and seen dissecting along the fascial plane.   Completed without difficulty  Pain immediately resolved suggesting accurate placement of the medication.  Advised to call if fevers/chills, erythema, induration, drainage, or persistent bleeding.  Images permanently stored and available for review in the ultrasound unit.  Impression: Technically successful ultrasound guided injection.  Impression and Recommendations:   This case required medical decision making of moderate complexity.

## 2014-11-20 ENCOUNTER — Other Ambulatory Visit: Payer: Self-pay

## 2014-11-20 DIAGNOSIS — M542 Cervicalgia: Principal | ICD-10-CM

## 2014-11-20 DIAGNOSIS — G8929 Other chronic pain: Secondary | ICD-10-CM

## 2014-11-20 MED ORDER — HYDROCODONE-ACETAMINOPHEN 5-325 MG PO TABS: 1.0000 | ORAL_TABLET | ORAL | Status: DC | PRN

## 2014-12-05 ENCOUNTER — Encounter: Payer: Self-pay | Admitting: Sports Medicine

## 2014-12-05 ENCOUNTER — Ambulatory Visit (INDEPENDENT_AMBULATORY_CARE_PROVIDER_SITE_OTHER): Payer: BLUE CROSS/BLUE SHIELD | Admitting: Sports Medicine

## 2014-12-05 VITALS — BP 122/70 | HR 61 | Wt 159.0 lb

## 2014-12-05 DIAGNOSIS — M79671 Pain in right foot: Secondary | ICD-10-CM

## 2014-12-05 NOTE — Progress Notes (Addendum)
    Patient was fitted for a : standard, cushioned, semi-rigid orthotic. The orthotic was heated and afterward the patient stood on the orthotic blank positioned on the orthotic stand. The patient was positioned in subtalar neutral position and 10 degrees of ankle dorsiflexion in a weight bearing stance. After completion of molding, a stable base was applied to the orthotic blank. The blank was ground to a stable position for weight bearing. Size: 6 Base: [employer] and safety inspector and Padding: None Patient did not bring shoes other than high heels so we were unable to test the orthotics.  I spent 40 minutes with this patient, greater than 50% was face-to-face time counseling regarding the below diagnosis.

## 2014-12-05 NOTE — Assessment & Plan Note (Signed)
Custom orthotics as above, excellent response to superficial quadratus plantae injection at the last visit. Next line return in one month.

## 2014-12-07 ENCOUNTER — Other Ambulatory Visit: Payer: Self-pay | Admitting: Family Medicine

## 2014-12-11 ENCOUNTER — Other Ambulatory Visit: Payer: Self-pay | Admitting: Family Medicine

## 2014-12-11 NOTE — Telephone Encounter (Signed)
Emily Ponce, Rx placed in in-box ready for pickup/faxing.  

## 2014-12-18 ENCOUNTER — Telehealth: Payer: Self-pay | Admitting: *Deleted

## 2014-12-18 DIAGNOSIS — M542 Cervicalgia: Principal | ICD-10-CM

## 2014-12-18 DIAGNOSIS — G8929 Other chronic pain: Secondary | ICD-10-CM

## 2014-12-18 MED ORDER — HYDROCODONE-ACETAMINOPHEN 5-325 MG PO TABS: 1.0000 | ORAL_TABLET | ORAL | Status: DC | PRN

## 2014-12-18 NOTE — Telephone Encounter (Signed)
Pt left vm asking for a refill on her hydrocodone.  Is this ok to fill? Please advise.

## 2014-12-18 NOTE — Telephone Encounter (Signed)
LMOM notifying pt that rx is ready for pick up.

## 2014-12-18 NOTE — Telephone Encounter (Signed)
Yes, ok to refill.  Amber the Rx is now in your in box.

## 2015-01-01 ENCOUNTER — Encounter: Payer: Self-pay | Admitting: Family Medicine

## 2015-01-01 ENCOUNTER — Ambulatory Visit (INDEPENDENT_AMBULATORY_CARE_PROVIDER_SITE_OTHER): Payer: BLUE CROSS/BLUE SHIELD | Admitting: Family Medicine

## 2015-01-01 VITALS — BP 124/69 | HR 68 | Wt 157.0 lb

## 2015-01-01 DIAGNOSIS — M542 Cervicalgia: Secondary | ICD-10-CM | POA: Diagnosis not present

## 2015-01-01 DIAGNOSIS — I10 Essential (primary) hypertension: Secondary | ICD-10-CM

## 2015-01-01 DIAGNOSIS — G8929 Other chronic pain: Secondary | ICD-10-CM | POA: Diagnosis not present

## 2015-01-01 DIAGNOSIS — R102 Pelvic and perineal pain: Secondary | ICD-10-CM

## 2015-01-01 MED ORDER — ATENOLOL 50 MG PO TABS: 50.0000 mg | ORAL_TABLET | Freq: Every day | ORAL | Status: DC

## 2015-01-01 MED ORDER — HYDROCODONE-ACETAMINOPHEN 5-325 MG PO TABS: 1.0000 | ORAL_TABLET | ORAL | Status: DC | PRN

## 2015-01-01 MED ORDER — HYDROCHLOROTHIAZIDE 25 MG PO TABS
25.0000 mg | ORAL_TABLET | Freq: Every day | ORAL | Status: DC
Start: 2015-01-01 — End: 2015-05-14

## 2015-01-01 NOTE — Progress Notes (Signed)
CC: Emily Ponce is a 49 y.o. female is here for Menstrual Problem   Subjective: HPI:  Patient planes of pelvic cramping along with off and on bleeding for the entire month of June. Up until May her periods are predictable and she would bleed only for a few days without cramping. In May she did not have a period only to be followed by unpredictable bleeding during the month of June. Pain was moderate in severity and she was using hydrocodone that she had at home to help relieve the pain. She had no other interventions because she was afraid Motrin would make her bleed more. She denies any radiation of the pain nor any other genitourinary complaints. No flank pain or abdominal pain. She sexually active but her partner had a vasectomy. She denies fevers, chills, nor nausea. No constipation or diarrhea.  Requesting refills on atenolol and hydrochlorothiazide. No outside blood pressures to report. No chest pain shortness of breath or orthopnea.  She's run out of hydrocodone that she uses primarily for her neck since she was having menstrual cramps. She's requesting a refill.   Review Of Systems Outlined In HPI  Past Medical History  Diagnosis Date  . Hypertension     Past Surgical History  Procedure Laterality Date  . Carpal tunnel release Left   . Esure      Family History  Problem Relation Age of Onset  . Heart attack Mother   . Hypertension Mother   . Alzheimer's disease Father   . Hypertension Father     History   Social History  . Marital Status: Married    Spouse Name: N/A  . Number of Children: N/A  . Years of Education: N/A   Occupational History  . Not on file.   Social History Main Topics  . Smoking status: Former Smoker    Quit date: 04/24/2010  . Smokeless tobacco: Not on file  . Alcohol Use: Yes     Comment: occasional  . Drug Use: No  . Sexual Activity:    Partners: Male   Other Topics Concern  . Not on file   Social History Narrative      Objective: BP 124/69 mmHg  Pulse 68  Wt 157 lb (71.215 kg)  SpO2 98%  Vital signs reviewed. General: Alert and Oriented, No Acute Distress HEENT: Pupils equal, round, reactive to light. Conjunctivae clear.  External ears unremarkable.  Moist mucous membranes. Lungs: Clear and comfortable work of breathing, speaking in full sentences without accessory muscle use. Cardiac: Regular rate and rhythm.  Abdomen: Soft nontender Extremities: No peripheral edema.  Strong peripheral pulses.  Mental Status: No depression, anxiety, nor agitation. Logical though process. Skin: Warm and dry.  Assessment & Plan: Emily Ponce was seen today for menstrual problem.  Diagnoses and all orders for this visit:  Pelvic pain in female Orders: -     US Pelvis Complete; Future  Chronic neck pain Orders: -     HYDROcodone-acetaminophen (NORCO/VICODIN) 5-325 MG per tablet; Take 1-2 tablets by mouth every 4 (four) hours as needed for severe pain. Take with food.  Essential hypertension Orders: -     hydrochlorothiazide (HYDRODIURIL) 25 MG tablet; Take 1 tablet (25 mg total) by mouth daily. -     atenolol (TENORMIN) 50 MG tablet; Take 1 tablet (50 mg total) by mouth daily.   Pelvic pain: High suspicion of premenopausal state will obtain pelvic ultrasound to rule out fibroid, polyp or endometrial hyperplasia. Encouraged her to use Motrin  and some hydrocodone in the future for any pelvic pain that feels like cramping. Chronic neck pain: Hydrocodone refilled Essential hypertension: Controlled continue HCTZ and atenolol  Return in about 3 months (around 04/03/2015).

## 2015-01-02 ENCOUNTER — Other Ambulatory Visit: Payer: Self-pay | Admitting: Family Medicine

## 2015-01-02 DIAGNOSIS — R102 Pelvic and perineal pain: Secondary | ICD-10-CM

## 2015-01-04 ENCOUNTER — Ambulatory Visit: Payer: BLUE CROSS/BLUE SHIELD | Admitting: Sports Medicine

## 2015-01-04 ENCOUNTER — Ambulatory Visit (INDEPENDENT_AMBULATORY_CARE_PROVIDER_SITE_OTHER): Payer: BLUE CROSS/BLUE SHIELD

## 2015-01-04 DIAGNOSIS — R102 Pelvic and perineal pain: Secondary | ICD-10-CM | POA: Diagnosis not present

## 2015-01-05 ENCOUNTER — Telehealth: Payer: Self-pay | Admitting: Family Medicine

## 2015-01-05 DIAGNOSIS — D259 Leiomyoma of uterus, unspecified: Secondary | ICD-10-CM

## 2015-01-05 DIAGNOSIS — D219 Benign neoplasm of connective and other soft tissue, unspecified: Secondary | ICD-10-CM | POA: Insufficient documentation

## 2015-01-05 DIAGNOSIS — N838 Other noninflammatory disorders of ovary, fallopian tube and broad ligament: Secondary | ICD-10-CM | POA: Insufficient documentation

## 2015-01-05 NOTE — Telephone Encounter (Signed)
Left message on cell

## 2015-01-05 NOTE — Telephone Encounter (Signed)
Seth Bake, Will you please let patient know that her ultrasound showed a benign uterine fibroid and a cyst on one of her ovaries.  For further management of these findings along with her abnormal bleeding pattern I've placed a referral for her to meet with an OBGYN.  Please let me know if not contacted by next week about scheduling.

## 2015-01-09 ENCOUNTER — Telehealth: Payer: Self-pay

## 2015-01-09 NOTE — Telephone Encounter (Signed)
Received referral from dr. Ileene Rubens office left message for patient to call office to schedule appointment.

## 2015-01-15 ENCOUNTER — Encounter: Payer: BLUE CROSS/BLUE SHIELD | Admitting: Obstetrics & Gynecology

## 2015-01-16 ENCOUNTER — Ambulatory Visit (INDEPENDENT_AMBULATORY_CARE_PROVIDER_SITE_OTHER): Payer: BLUE CROSS/BLUE SHIELD | Admitting: Obstetrics & Gynecology

## 2015-01-16 ENCOUNTER — Encounter: Payer: Self-pay | Admitting: Obstetrics & Gynecology

## 2015-01-16 VITALS — BP 115/73 | HR 71 | Resp 16 | Ht 65.0 in | Wt 158.0 lb

## 2015-01-16 DIAGNOSIS — N921 Excessive and frequent menstruation with irregular cycle: Secondary | ICD-10-CM | POA: Insufficient documentation

## 2015-01-16 DIAGNOSIS — R102 Pelvic and perineal pain: Secondary | ICD-10-CM | POA: Diagnosis not present

## 2015-01-16 MED ORDER — MEGESTROL ACETATE 40 MG PO TABS: ORAL_TABLET | ORAL | Status: DC

## 2015-01-16 MED ORDER — HYDROCODONE-ACETAMINOPHEN 5-325 MG PO TABS: ORAL_TABLET | ORAL | Status: DC

## 2015-01-16 MED ORDER — IBUPROFEN 600 MG PO TABS: 600.0000 mg | ORAL_TABLET | Freq: Four times a day (QID) | ORAL | Status: DC | PRN

## 2015-01-16 NOTE — Progress Notes (Signed)
Subjective:    Patient ID: Emily Ponce, female    DOB: June 03, 1966, 49 y.o.   MRN: [id number]  HPI  Patient is a 49 year old female who presents for irregular bleeding and bloating and a right ovarian cyst found on ultrasound. Patient is a menstruating female and is not in menopause. She does not have hot flashes and flushes. She has normal menses once a month excepted May she had 2 menses. She bled one week very heavy. She stopped for 2 weeks, and then restarted for another week. She stopped again and has restarted again. Patient is very frustrated from her bleeding and feels like she is tired. She has not had a CBC drawn. Has pelvic cramping and bloating. The ovarian mass was found on the right ovary which is consistent with hemorrhagic cyst.  Review of Systems  Constitutional: Positive for fatigue.  Respiratory: Negative.   Gastrointestinal: Positive for abdominal pain and abdominal distention.  Genitourinary: Positive for vaginal bleeding, menstrual problem and pelvic pain.  Musculoskeletal: Positive for neck pain.  Allergic/Immunologic: Negative.        Objective:   Physical Exam  Constitutional: She is oriented to person, place, and time. She appears well-developed and well-nourished. No distress.  HENT:  Head: Normocephalic and atraumatic.  Eyes: Conjunctivae are normal.  Pulmonary/Chest: Effort normal.  Abdominal: Soft. Bowel sounds are normal. She exhibits no distension and no mass. There is no tenderness. There is no rebound and no guarding.  Genitourinary:  Tanner 5 Vulva: No lesion Vagina: No lesion, blood in the vault Cervix: Small nabothian cyst no lesion Uterus mobile and moderately tender Adnexa no masses nontender Urethra: No prolapse  Musculoskeletal: She exhibits no edema.  Neurological: She is alert and oriented to person, place, and time.  Skin: Skin is warm and dry.  Psychiatric: She has a normal mood and affect.  Vitals reviewed.   The  patient's past medical history past surgical history past family history and past social history were reviewed  Past Medical History  Diagnosis Date  . Hypertension   . Ovarian cyst   . 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  1.  Given the patient's bleeding history in a endometrial biopsy is warranted today and this procedure was done. We will start Megace taper 2.  Right ovary looks like a hemorrhagic cyst. Patient moderately anxious and will do CA-125. We'll repeat ultrasound in 8 weeks. 3.  Return to clinic in 2 weeks 4.  TSH is normal early this year. We'll get CBC. Pregnancy test is negative  ENDOMETRIAL BIOPSY     The indications for endometrial biopsy were reviewed.   Risks of the biopsy including cramping, bleeding, infection, uterine perforation, inadequate specimen and need for additional procedures  were discussed. The patient states she understands and agrees to undergo procedure today. Consent was signed. Time out was performed. Urine HCG was negative. A sterile speculum was placed in the patient's vagina and the cervix was prepped with Betadine. A single-toothed tenaculum was placed on the anterior lip of the cervix to stabilize it. The 3 mm pipelle was introduced into the endometrial cavity without difficulty to a depth of 8.5 cm, and a moderate amount of tissue was obtained and sent to pathology. The instruments were removed from the patient's vagina. Minimal bleeding from the cervix was noted. The patient tolerated the procedure  well. Routine post-procedure instructions were given to the patient. The patient will follow up to review the results and for further management.

## 2015-01-17 ENCOUNTER — Telehealth: Payer: Self-pay | Admitting: *Deleted

## 2015-01-17 LAB — CBC
HCT: 43.2 % (ref 36.0–46.0)
Hemoglobin: 14.4 g/dL (ref 12.0–15.0)
MCH: 31.8 pg (ref 26.0–34.0)
MCHC: 33.3 g/dL (ref 30.0–36.0)
MCV: 95.4 fL (ref 78.0–100.0)
MPV: 11.1 fL (ref 8.6–12.4)
Platelets: 233 10*3/uL (ref 150–400)
RBC: 4.53 MIL/uL (ref 3.87–5.11)
RDW: 13.6 % (ref 11.5–15.5)
WBC: 9.2 10*3/uL (ref 4.0–10.5)

## 2015-01-17 LAB — TSH: TSH: 1.34 u[IU]/mL (ref 0.350–4.500)

## 2015-01-17 LAB — GC/CHLAMYDIA PROBE AMP
CT Probe RNA: NEGATIVE
GC Probe RNA: NEGATIVE

## 2015-01-17 LAB — CA 125: CA 125: 9 U/mL (ref <35)

## 2015-01-17 LAB — FOLLICLE STIMULATING HORMONE: FSH: 25.6 m[IU]/mL

## 2015-01-17 NOTE — Telephone Encounter (Signed)
Pt notified of normal labs.  She is to repeat U/S in 8 weeks and path is pending.

## 2015-01-22 ENCOUNTER — Telehealth: Payer: Self-pay | Admitting: *Deleted

## 2015-01-22 NOTE — Telephone Encounter (Signed)
Pt notified of normal pathology and to continue the Megace.  She did state that she has finally stopped bleeding.

## 2015-01-22 NOTE — Telephone Encounter (Signed)
-----   Message from Guss Bunde, MD sent at 01/22/2015  8:55 AM EDT ----- Call the patient and let them know their pathology results are normal.  Continue Megace Thanks!!

## 2015-01-25 ENCOUNTER — Other Ambulatory Visit: Payer: Self-pay | Admitting: *Deleted

## 2015-01-25 DIAGNOSIS — G8929 Other chronic pain: Secondary | ICD-10-CM

## 2015-01-25 DIAGNOSIS — M542 Cervicalgia: Principal | ICD-10-CM

## 2015-01-25 MED ORDER — HYDROCODONE-ACETAMINOPHEN 5-325 MG PO TABS: 1.0000 | ORAL_TABLET | ORAL | Status: DC | PRN

## 2015-01-30 ENCOUNTER — Ambulatory Visit (INDEPENDENT_AMBULATORY_CARE_PROVIDER_SITE_OTHER): Payer: BLUE CROSS/BLUE SHIELD | Admitting: Obstetrics & Gynecology

## 2015-01-30 ENCOUNTER — Encounter: Payer: Self-pay | Admitting: Obstetrics & Gynecology

## 2015-01-30 VITALS — BP 108/64 | HR 62 | Resp 16 | Ht 65.0 in | Wt 160.0 lb

## 2015-01-30 DIAGNOSIS — N83209 Unspecified ovarian cyst, unspecified side: Secondary | ICD-10-CM

## 2015-01-30 DIAGNOSIS — N8320 Unspecified ovarian cysts: Secondary | ICD-10-CM

## 2015-01-30 NOTE — Progress Notes (Signed)
   Subjective:    Patient ID: Emily Ponce, female    DOB: 05-Apr-1966, 49 y.o.   MRN: [id number]  HPI  Pt presents for f/u of ovarian cyst and irregualr bleeding  Megace has stopped bleeding and she has been off for 1 week with no bleeding.  Pt may bled next week.  She is close to menopause [facility] 25) but has not gone >2 months without menses.  Pt needs f/u ovarian cyst.  Pt denies pain, N/V/D.  No issues  Review of Systems  Constitutional: Negative.   Respiratory: Negative.   Cardiovascular: Negative.   Gastrointestinal: Negative.   Genitourinary: Negative.   Psychiatric/Behavioral: Negative.        Objective:   Physical Exam  Constitutional: She is oriented to person, place, and time. She appears well-developed and well-nourished. No distress.  HENT:  Head: Normocephalic and atraumatic.  Eyes: Conjunctivae are normal.  Pulmonary/Chest: Effort normal.  Abdominal: Soft. Bowel sounds are normal. There is no tenderness.  Musculoskeletal: She exhibits no edema.  Neurological: She is alert and oriented to person, place, and time.  Skin: Skin is warm and dry.  Psychiatric: She has a normal mood and affect.  Vitals reviewed.  Filed Vitals:   01/30/15 1554  BP: 108/64  Pulse: 62  Resp: 16  Height: 5\' 5"  (1.651 m)  Weight: 160 lb (72.576 kg)          Assessment & Plan:  49 yo female in perimenpause and ovarian cyst (prob hemorrhagic)  1-Continue to monitor bleeding.  Call with changes.  Pt may still expect menses. 2-F/U TVUS ovarian cyst. (right) end of August.  This was ordered. 3.  Pt states she is up to date on mammogram, pap, and colonoscopy.

## 2015-02-23 ENCOUNTER — Other Ambulatory Visit: Payer: Self-pay

## 2015-02-23 DIAGNOSIS — G8929 Other chronic pain: Secondary | ICD-10-CM

## 2015-02-23 DIAGNOSIS — M542 Cervicalgia: Principal | ICD-10-CM

## 2015-02-23 MED ORDER — HYDROCODONE-ACETAMINOPHEN 5-325 MG PO TABS: 1.0000 | ORAL_TABLET | ORAL | Status: DC | PRN

## 2015-03-06 ENCOUNTER — Ambulatory Visit (INDEPENDENT_AMBULATORY_CARE_PROVIDER_SITE_OTHER): Payer: BLUE CROSS/BLUE SHIELD

## 2015-03-06 DIAGNOSIS — N83209 Unspecified ovarian cyst, unspecified side: Secondary | ICD-10-CM

## 2015-03-06 DIAGNOSIS — N8320 Unspecified ovarian cysts: Secondary | ICD-10-CM

## 2015-03-08 ENCOUNTER — Other Ambulatory Visit: Payer: Self-pay | Admitting: Family Medicine

## 2015-03-09 ENCOUNTER — Telehealth: Payer: Self-pay | Admitting: *Deleted

## 2015-03-09 ENCOUNTER — Other Ambulatory Visit: Payer: Self-pay | Admitting: Family Medicine

## 2015-03-09 ENCOUNTER — Encounter: Payer: Self-pay | Admitting: Obstetrics & Gynecology

## 2015-03-09 NOTE — Telephone Encounter (Signed)
Called pt to adv cyst still present - she said she already spoke to someone and has follow up marked on her calendar.

## 2015-03-09 NOTE — Telephone Encounter (Signed)
-----   Message from Guss Bunde, MD sent at 03/09/2015  5:29 AM EDT ----- Lanell Matar cystt still present.  Pt to have f/u US in 8 weeks.  IF still present will get MRI.  [place] to be done at [facility].  RN to call patient.

## 2015-03-14 ENCOUNTER — Telehealth: Payer: Self-pay | Admitting: *Deleted

## 2015-03-14 ENCOUNTER — Other Ambulatory Visit: Payer: Self-pay | Admitting: Obstetrics & Gynecology

## 2015-03-14 NOTE — Telephone Encounter (Signed)
-----   Message from Guss Bunde, MD sent at 03/14/2015  5:47 AM EDT ----- Can you call and see what is happeining with her cycle.  I got a request for more megace.

## 2015-03-14 NOTE — Telephone Encounter (Signed)
LM on voicemail to touch base about her cycles.  RF request received for Megace and just wanted to see how her cycles were.  LM for pt to call with updates.

## 2015-03-21 ENCOUNTER — Other Ambulatory Visit: Payer: Self-pay

## 2015-03-21 DIAGNOSIS — M542 Cervicalgia: Principal | ICD-10-CM

## 2015-03-21 DIAGNOSIS — G8929 Other chronic pain: Secondary | ICD-10-CM

## 2015-03-21 MED ORDER — HYDROCODONE-ACETAMINOPHEN 5-325 MG PO TABS: 1.0000 | ORAL_TABLET | ORAL | Status: DC | PRN

## 2015-03-21 NOTE — Telephone Encounter (Signed)
Patient request refill for Hydrocodone. Medardo Hassing,CMA

## 2015-03-21 NOTE — Telephone Encounter (Signed)
Left detailed message on patient vm advising that Rx is ready for pickup. Maxmillian Carsey,CMA

## 2015-03-21 NOTE — Telephone Encounter (Signed)
Andrea, Rx placed in in-box ready for pickup/faxing.  

## 2015-04-11 ENCOUNTER — Telehealth: Payer: Self-pay | Admitting: *Deleted

## 2015-04-11 NOTE — Telephone Encounter (Signed)
See previous telephone message  

## 2015-04-11 NOTE — Telephone Encounter (Signed)
Left another message on home voicemail of an update on how her bleeding is going with the Megace use.  I told her that we had received a RF request from the pharmacy and would not RF until we had an update.

## 2015-04-11 NOTE — Telephone Encounter (Signed)
-----   Message from Guss Bunde, MD sent at 04/11/2015  3:38 PM EDT ----- I just got antother Rx request for more megace.  Did she ever call you back?

## 2015-04-16 ENCOUNTER — Telehealth: Payer: Self-pay

## 2015-04-16 NOTE — Telephone Encounter (Signed)
Emily Ponce is a patient of Dr. Ileene Rubens and left a message stating that her hydrocodone is running out and needs a refill.

## 2015-04-16 NOTE — Telephone Encounter (Signed)
Not due until Friday

## 2015-04-19 ENCOUNTER — Ambulatory Visit (INDEPENDENT_AMBULATORY_CARE_PROVIDER_SITE_OTHER): Payer: BLUE CROSS/BLUE SHIELD | Admitting: Physical Therapy

## 2015-04-19 ENCOUNTER — Encounter: Payer: Self-pay | Admitting: Physical Therapy

## 2015-04-19 DIAGNOSIS — R29898 Other symptoms and signs involving the musculoskeletal system: Secondary | ICD-10-CM | POA: Diagnosis not present

## 2015-04-19 NOTE — Patient Instructions (Signed)
Scapula Adduction With Pectorals, Low   Stand in doorframe with palms against frame and arms at 45. Lean forward and squeeze shoulder blades. Hold 20-30__ seconds. Repeat _1__ times per session. Do _1__ sessions per day.   Scapula Adduction With Pectorals, Mid-Range   Stand in doorframe with palms against frame and arms at 90. Lean forward and squeeze shoulder blades. Hold 20-30__ seconds. Repeat _1__ times per session. Do _1__ sessions per day.  Strengthening: Resisted Internal Rotation    Hold tubing in left hand, elbow at side and forearm out. Rotate forearm in across body. Repeat _10___ times per set. Do _3___ sets per session. Do _1___ sessions per day.  Strengthening: Resisted External Rotation    Hold tubing in left hand, elbow at side and forearm across body, hold towel under elbow. Rotate forearm out. Repeat _10___ times per set. Do __3__ sets per session. Do __1__ sessions per day.  http://orth.exer.us/828   Copyright  VHI. All rights reserved.

## 2015-04-19 NOTE — Therapy (Addendum)
[facility] [place], [place], [address] Phone: [phone]   Fax:  [phone]  Physical Therapy Evaluation  Patient Details  Name: Liese Dizdarevic MRN: [id number] Date of Birth: [birth year] No Data Recorded  Encounter Date: 04/19/2015      PT End of Session - 04/19/15 1721    Visit Number 1   Date for PT Re-Evaluation 05/03/15   PT Start Time 1649   PT Stop Time 1720   PT Time Calculation (min) 31 min   Activity Tolerance Patient tolerated treatment well      Past Medical History  Diagnosis Date  . Hypertension   . Ovarian cyst   . Broken neck ([place])     c-7    Past Surgical History  Procedure Laterality Date  . Carpal tunnel release Left   . Esure       There were no vitals filed for this visit.  Visit Diagnosis:  Weakness of shoulder - Plan: PT plan of care cert/re-cert      Subjective Assessment - 04/19/15 1650    Subjective Pt reports Lt shoulder pain 4 wks ago, saw MD last week. Got to the point wher she couldn't lift her arm out to the side more than 45 degrees.  MD gave her an injection and this has resolved almost all of her pain. REports 100% improvement.    Pertinent History Has been performing shoulder ther ex at home, pendulum, works out at [employer].    Patient Stated Goals wishes to return to upper body work at the gym.    Currently in Pain? No/denies            [facility] PT Assessment - 04/19/15 0001    Assessment   Medical Diagnosis Lt shoulder adhesive capsulitis, bursitis, neck pain    Onset Date/Surgical Date 03/22/15   Hand Dominance Right   Next MD Visit as needed   Precautions   Precautions None   Gas residence   Prior Function   Level of Independence Independent   Vocation Full time employment   Vocation Requirements desk job, goes to school   Leisure workout, movies   Observation/Other Assessments   Focus on  Therapeutic Outcomes (FOTO)  21% limited   Posture/Postural Control   Posture/Postural Control Postural limitations   Postural Limitations Forward head;Increased lumbar lordosis  slight lateral shift to the Rt - corrected with physical cue   ROM / Strength   AROM / PROM / Strength AROM;Strength   AROM   Overall AROM Comments cervical and UE'sWNL   Strength   Overall Strength Comments Bilat shoulder WNL except Lt ER 4/5, Ir 5-/5   Strength Assessment Site --  mid trap /upper trap 5-/5                   [facility] Adult PT Treatment/Exercise - 04/19/15 0001    Exercises   Exercises Shoulder   Shoulder Exercises: Standing   External Rotation Strengthening;Left;Theraband  3x10   Theraband Level (Shoulder External Rotation) Level 2 (Red)  20 reps, yellow for 10 reps   Internal Rotation Left;Theraband  3x10   Theraband Level (Shoulder Internal Rotation) Level 2 (Red)   Shoulder Exercises: Stretch   Other Shoulder Stretches doorway stretch elbows low and mid x 20sec each                PT Education - 04/19/15 1731    Education provided Yes  Education Details HEP and posture   Person(s) Educated Patient   Methods Explanation;Demonstration;Handout   Comprehension Returned demonstration;Verbalized understanding             PT Long Term Goals - 04/19/15 1734    PT LONG TERM GOAL #1   Title I with HEP   Period --  at eval   Status Achieved               Plan - 04/19/15 1731    Clinical Impression Statement 49 yo female presents with 4wk h/o Lt shoulder pain that has resolved over the last week since she had an injection.  She does have some weakness in her RTC and was performing some exercise at the gym that arent' the best for her shoulder.  She is going to perform her new HEP and add in safe exercise at the gym as well as be aware of her posture/positioning. Pt will most likely not need  more therapy however I will leave her plan open for 2 wks.  If  pain begins to return she should come back in for treatment.    Pt will benefit from skilled therapeutic intervention in order to improve on the following deficits Decreased strength   Rehab Potential Excellent   PT Frequency --  PRN   PT Duration 2 weeks   PT Next Visit Plan if pain returns she will come in for therapy, otherwise will d/c in 2 wks.    Consulted and Agree with Plan of Care Patient         Problem List Patient Active Problem List   Diagnosis Date Noted  . Menometrorrhagia 01/16/2015  . Ovarian mass 01/05/2015  . Fibroid 01/05/2015  . Right foot pain 11/17/2014  . Hyperlipidemia 07/26/2014  . Preventive measure 07/06/2014  . 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  . Essential hypertension 04/24/2014  . Chronic headaches 04/24/2014  . Generalized anxiety disorder 04/24/2014  . Chronic neck pain 04/24/2014  . Chronic cervical pain 01/20/2014  . Headache, migraine 07/17/2012  . Aneurysm ([place]) 05/19/2011    Jeral Pinch PT 04/19/2015, 5:36 PM  [facility] [place], [place], [address] Phone: [phone]   Fax:  [phone]  Name: Sheza Strickland MRN: [id number] Date of Birth: 05-21-1966   PHYSICAL THERAPY DISCHARGE SUMMARY  Visits from Start of Care: 1  Current functional level related to goals / functional outcomes: See above   Remaining deficits: unknown   Education / Equipment: HEP Plan: Patient agrees to discharge.  Patient goals were met. Patient is being discharged due to meeting the stated rehab goals. Patient was seen for one visit, her chart was left open incase she had a change in status and needed to return.  She has not called so will close her chart.  ?????         Jeral Pinch, PT 05/30/2015 7:46 AM

## 2015-04-20 ENCOUNTER — Other Ambulatory Visit: Payer: Self-pay

## 2015-04-20 DIAGNOSIS — M542 Cervicalgia: Principal | ICD-10-CM

## 2015-04-20 DIAGNOSIS — G8929 Other chronic pain: Secondary | ICD-10-CM

## 2015-04-20 MED ORDER — HYDROCODONE-ACETAMINOPHEN 5-325 MG PO TABS: 1.0000 | ORAL_TABLET | ORAL | Status: DC | PRN

## 2015-04-23 ENCOUNTER — Encounter: Payer: BLUE CROSS/BLUE SHIELD | Admitting: Physical Therapy

## 2015-05-07 ENCOUNTER — Other Ambulatory Visit: Payer: Self-pay | Admitting: Family Medicine

## 2015-05-08 ENCOUNTER — Telehealth: Payer: Self-pay | Admitting: *Deleted

## 2015-05-08 DIAGNOSIS — R102 Pelvic and perineal pain: Secondary | ICD-10-CM

## 2015-05-08 MED ORDER — ALPRAZOLAM 0.5 MG PO TABS: ORAL_TABLET | ORAL | Status: DC

## 2015-05-08 NOTE — Telephone Encounter (Signed)
F/U US scheduled for pelvic pain and bleeding

## 2015-05-09 LAB — HM MAMMOGRAPHY

## 2015-05-14 ENCOUNTER — Ambulatory Visit (INDEPENDENT_AMBULATORY_CARE_PROVIDER_SITE_OTHER): Payer: BLUE CROSS/BLUE SHIELD

## 2015-05-14 ENCOUNTER — Ambulatory Visit (INDEPENDENT_AMBULATORY_CARE_PROVIDER_SITE_OTHER): Payer: BLUE CROSS/BLUE SHIELD | Admitting: Family Medicine

## 2015-05-14 ENCOUNTER — Encounter: Payer: Self-pay | Admitting: Family Medicine

## 2015-05-14 VITALS — BP 130/77 | HR 64 | Wt 158.0 lb

## 2015-05-14 DIAGNOSIS — R102 Pelvic and perineal pain: Secondary | ICD-10-CM

## 2015-05-14 DIAGNOSIS — I10 Essential (primary) hypertension: Secondary | ICD-10-CM | POA: Diagnosis not present

## 2015-05-14 DIAGNOSIS — G8929 Other chronic pain: Secondary | ICD-10-CM | POA: Diagnosis not present

## 2015-05-14 DIAGNOSIS — D251 Intramural leiomyoma of uterus: Secondary | ICD-10-CM | POA: Diagnosis not present

## 2015-05-14 DIAGNOSIS — M542 Cervicalgia: Secondary | ICD-10-CM | POA: Diagnosis not present

## 2015-05-14 DIAGNOSIS — M25571 Pain in right ankle and joints of right foot: Secondary | ICD-10-CM

## 2015-05-14 MED ORDER — ATENOLOL 50 MG PO TABS: 50.0000 mg | ORAL_TABLET | Freq: Every day | ORAL | Status: DC

## 2015-05-14 MED ORDER — HYDROCHLOROTHIAZIDE 25 MG PO TABS: 25.0000 mg | ORAL_TABLET | Freq: Every day | ORAL | Status: DC

## 2015-05-14 MED ORDER — HYDROCODONE-ACETAMINOPHEN 5-325 MG PO TABS: 1.0000 | ORAL_TABLET | ORAL | Status: DC | PRN

## 2015-05-14 MED ORDER — ALPRAZOLAM 0.5 MG PO TABS: ORAL_TABLET | ORAL | Status: DC

## 2015-05-14 NOTE — Patient Instructions (Signed)
Wear your ankle immobilizer during all hours you are awake other than when bathing or driving.  You'll need to wear this for 3-4 weeks before your symptoms of pain should subside.

## 2015-05-14 NOTE — Progress Notes (Signed)
CC: Emily Ponce is a 49 y.o. female is here for Ankle Pain   Subjective: HPI:  Right-sided ankle pain localized on the lateral aspect of the ankle that radiates slightly of the lateral ankle proximally. Symptoms came on abruptly 6 weeks ago when she sustained a inversion injury and felt a strong pop at the site of her discomfort. She had an x-ray done was normal. She was told to stay off the foot as much as possible. She tells me that the pain and swelling has slightly improved however it is persistent and mild to moderate in severity on a daily basis. It is worse when she has been up walking around for more than a few minutes. Looks and feels the best first thing in the morning. She she denies any overlying skin changes. She denies any weakness or sensory disturbances in the foot.  She is requesting refills on atenolol and hydrochlorothiazide. No outside blood pressures report. No chest pain shortness of breath orthopnea nor peripheral edema  Other than the swelling described above   she tells me that a prescription for alprazolam that was printed off on the first of this month did not arrive at her pharmacy through fax. She is requesting a printed copy of the prescription today  She's also requesting a refill on hydrocodone, she tells me she doesn't need a refill quite yet but would like to save herself an extra trip in the middle of this month.   Review Of Systems Outlined In HPI  Past Medical History  Diagnosis Date  . Hypertension   . Ovarian cyst   . Broken neck ([place])     c-7    Past Surgical History  Procedure Laterality Date  . Carpal tunnel release Left   . Esure      Family History  Problem Relation Age of Onset  . Heart attack Mother   . Hypertension Mother   . Alzheimer's disease Father   . Hypertension Father   . Cancer Sister     cervical    Social History   Social History  . Marital Status: Married    Spouse Name: N/A  . Number of Children: N/A  .  Years of Education: N/A   Occupational History  . [employer]    Social History Main Topics  . Smoking status: Former Smoker    Quit date: 04/24/2010  . Smokeless tobacco: Not on file  . Alcohol Use: Yes     Comment: occasional  . Drug Use: No  . Sexual Activity:    Partners: Male   Other Topics Concern  . Not on file   Social History Narrative     Objective: BP 130/77 mmHg  Pulse 64  Wt 158 lb (71.668 kg)  Vital signs reviewed. General: Alert and Oriented, No Acute Distress HEENT: Pupils equal, round, reactive to light. Conjunctivae clear.  External ears unremarkable.  Moist mucous membranes. Lungs: Clear and comfortable work of breathing, speaking in full sentences without accessory muscle use. Cardiac: Regular rate and rhythm.  Neuro: CN II-XII grossly intact, gait normal. Extremities: No peripheral edema.  Strong peripheral pulses. Full range of motion and strength of the right ankle. No pain at the base of the fifth metatarsal. There is reproduction of her pain with palpation of the ATF ligament. No other pain on the medial or lateral malleoli. No overlying skin changes. Mental Status: No depression, anxiety, nor agitation. Logical though process. Skin: Warm and dry.  Assessment & Plan: Marillyn  was seen today for ankle pain.  Diagnoses and all orders for this visit:  Essential hypertension -     Discontinue: atenolol (TENORMIN) 50 MG tablet; Take 1 tablet (50 mg total) by mouth daily. -     Discontinue: hydrochlorothiazide (HYDRODIURIL) 25 MG tablet; Take 1 tablet (25 mg total) by mouth daily. -     atenolol (TENORMIN) 50 MG tablet; Take 1 tablet (50 mg total) by mouth daily. -     hydrochlorothiazide (HYDRODIURIL) 25 MG tablet; Take 1 tablet (25 mg total) by mouth daily.  Chronic neck pain -     HYDROcodone-acetaminophen (NORCO/VICODIN) 5-325 MG tablet; Take 1-2 tablets by mouth every 4 (four) hours as needed for severe pain. Take with food.  Right ankle  pain  Other orders -     ALPRAZolam (XANAX) 0.5 MG tablet; TAKE 1/2 TO 1 TAB AT BED TIME   Essential hypertension: Controlled continue atenolol and Hydrocortthiazide. Right ankle pain is most likely due to ATF sprain, recommend she immobilize this on a daily basis for the next 4 weeks. She was provided with an immobilizer today.   Return if symptoms worsen or fail to improve.

## 2015-05-15 ENCOUNTER — Telehealth: Payer: Self-pay | Admitting: *Deleted

## 2015-05-15 NOTE — Telephone Encounter (Signed)
-----   Message from Guss Bunde, MD sent at 05/15/2015  5:35 AM EST ----- Ovarian cyst resolved.  RN to call patient.

## 2015-05-15 NOTE — Telephone Encounter (Signed)
Called pt to adv normal [place] - LMOM

## 2015-05-18 ENCOUNTER — Encounter: Payer: Self-pay | Admitting: Emergency Medicine

## 2015-05-18 ENCOUNTER — Emergency Department
Admission: EM | Admit: 2015-05-18 | Discharge: 2015-05-18 | Disposition: A | Discharge status: Home / Self Care | Payer: BLUE CROSS/BLUE SHIELD | Source: Home / Self Care | Attending: Family Medicine | Admitting: Family Medicine

## 2015-05-18 DIAGNOSIS — R1112 Projectile vomiting: Secondary | ICD-10-CM | POA: Diagnosis not present

## 2015-05-18 NOTE — ED Notes (Signed)
4 days ago patient vomited, the next 2 days she had chills and felt nauseated and fatigued, today she feels fine and is eating normally. Came in to see what she had.

## 2015-05-18 NOTE — ED Provider Notes (Signed)
CSN: [id number]     Arrival date & time 05/18/15  1228 History   First MD Initiated Contact with Patient 05/18/15 1301     Chief Complaint  Patient presents with  . Nausea      HPI Comments: Three days ago after arriving home from work, patient developed mild nausea followed by "projectile vomiting."  She felt better the next morning, then developed fatigue and nausea (without vomiting) about noon.  She had some chills.  Yesterday, she again developed mild nausea at about noon, without vomiting.  Bowel movements have been normal.  No abdominal pain.  She feels well today.  The history is provided by the patient.    Past Medical History  Diagnosis Date  . Hypertension   . Ovarian cyst   . Broken neck ([place])     c-7   Past Surgical History  Procedure Laterality Date  . Carpal tunnel release Left   . Esure      Family History  Problem Relation Age of Onset  . Heart attack Mother   . Hypertension Mother   . Alzheimer's disease Father   . Hypertension Father   . Cancer Sister     cervical   Social History  Substance Use Topics  . Smoking status: Former Smoker    Quit date: 04/24/2010  . Smokeless tobacco: None  . Alcohol Use: Yes     Comment: occasional   OB History    Gravida Para Term Preterm AB TAB SAB Ectopic Multiple Living   3 2   1  1   2      Review of Systems No sore throat No cough No pleuritic pain No wheezing No nasal congestion No post-nasal drainage No sinus pain/pressure No itchy/red eyes No earache No hemoptysis No SOB No fever/chills + nausea, resolved + vomiting, resolved No abdominal pain No diarrhea No urinary symptoms No skin rash + fatigue No myalgias No headache    Allergies  Lyrica  Home Medications   Prior to Admission medications   Medication Sig Start Date End Date Taking? Authorizing Provider  ALPRAZolam (XANAX) 0.5 MG tablet TAKE 1/2 TO 1 TAB AT BED TIME 05/14/15   Sean Hommel, DO  atenolol (TENORMIN) 50 MG tablet Take  1 tablet (50 mg total) by mouth daily. 05/14/15   Marcial Pacas, DO  B Complex Vitamins (VITAMIN-B COMPLEX) TABS Take 1 tablet by mouth.    Historical Provider, MD  Flaxseed, Linseed, (FLAX SEEDS) POWD Take 1 tablet by mouth.    Historical Provider, MD  hydrochlorothiazide (HYDRODIURIL) 25 MG tablet Take 1 tablet (25 mg total) by mouth daily. 05/14/15   Marcial Pacas, DO  HYDROcodone-acetaminophen (NORCO/VICODIN) 5-325 MG tablet Take 1-2 tablets by mouth every 4 (four) hours as needed for severe pain. Take with food. 05/14/15   Sean Hommel, DO  Omega-3 1000 MG CAPS Take 1 g by mouth.    Historical Provider, MD   Meds Ordered and Administered this Visit  Medications - No data to display  BP 112/70 mmHg  Pulse 63  Temp(Src) 97.7 F (36.5 C) (Oral)  Ht 5\' 5"  (1.651 m)  Wt 161 lb (73.029 kg)  BMI 26.79 kg/m2  SpO2 98%  LMP 05/01/2015 No data found.   Physical Exam Nursing notes and Vital Signs reviewed. Appearance:  Patient appears stated age, and in no acute distress Eyes:  Pupils are equal, round, and reactive to light and accomodation.  Extraocular movement is intact.  Conjunctivae are not inflamed  Nose:  Normal Pharynx:  Normal; moist mucous membranes  Neck:  Supple.  No adenopathy. Lungs:  Clear to auscultation.  Breath sounds are equal.  Moving air well. Heart:  Regular rate and rhythm without murmurs, rubs, or gallops.  Abdomen:  Nontender without masses or hepatosplenomegaly.  Bowel sounds are present.  No CVA or flank tenderness.  Extremities:  No edema.    Skin:  No rash present.   ED Course  Procedures  none  MDM   1. Projectile vomiting with nausea; suspect viral gastroenteritis, resolved     Rest.  Increase fluid intake. If symptoms become significantly worse during the night or over the weekend, proceed to the local emergency room.  Followup with Family Doctor if symptoms recur.    Kandra Nicolas, MD 05/18/15 1318

## 2015-05-18 NOTE — Discharge Instructions (Signed)
Rest.  Increase fluid intake.  If symptoms become significantly worse during the night or over the weekend, proceed to the local emergency room.  

## 2015-05-21 ENCOUNTER — Ambulatory Visit (INDEPENDENT_AMBULATORY_CARE_PROVIDER_SITE_OTHER): Payer: BLUE CROSS/BLUE SHIELD | Admitting: Obstetrics & Gynecology

## 2015-05-21 ENCOUNTER — Encounter: Payer: Self-pay | Admitting: Obstetrics & Gynecology

## 2015-05-21 VITALS — BP 114/71 | HR 62 | Ht 65.0 in | Wt 161.0 lb

## 2015-05-21 DIAGNOSIS — N951 Menopausal and female climacteric states: Secondary | ICD-10-CM | POA: Diagnosis not present

## 2015-05-21 NOTE — Progress Notes (Signed)
  This is a 49 year old female who presents for follow-up of irregular menstrual cycles, perimenopausal symptoms, and ovarian cyst. Patient had a follow-up ultrasound the ovarian cyst is resolved. Patient still has one small fibroid. Her menstrual cycles are no longer an issue. Most recently she did skip the past 3 months and then had a menses. She is having hot flashes and flushes.  Chart review was accomplished. Pap smear and mammogram are up-to-date. She sees these on my chart. He is due for a Pap smear and 2017. Patient inquired about why hep C is on her my chart health maintenance. I explained recommendation. At this time she does not wish to have hep C test Patient also inquired about the zoster vaccine as well as a pneumococcal vaccine. Shingles vaccine is not indicated until 60 and the pneumococcal vaccine is not indicated until 65.  Face-to-face time with patient with 15 minutes with greater than 50% counseling as above.

## 2015-06-07 ENCOUNTER — Other Ambulatory Visit: Payer: Self-pay

## 2015-06-07 DIAGNOSIS — M542 Cervicalgia: Principal | ICD-10-CM

## 2015-06-07 DIAGNOSIS — G8929 Other chronic pain: Secondary | ICD-10-CM

## 2015-06-08 MED ORDER — HYDROCODONE-ACETAMINOPHEN 5-325 MG PO TABS: 1.0000 | ORAL_TABLET | ORAL | Status: DC | PRN

## 2015-06-08 NOTE — Telephone Encounter (Signed)
[place], Rx placed in in-box ready for pickup/faxing.  

## 2015-06-08 NOTE — Telephone Encounter (Signed)
Pt.notified

## 2015-07-03 ENCOUNTER — Other Ambulatory Visit: Payer: Self-pay

## 2015-07-03 DIAGNOSIS — M542 Cervicalgia: Principal | ICD-10-CM

## 2015-07-03 DIAGNOSIS — G8929 Other chronic pain: Secondary | ICD-10-CM

## 2015-07-03 MED ORDER — HYDROCODONE-ACETAMINOPHEN 5-325 MG PO TABS: 1.0000 | ORAL_TABLET | ORAL | Status: DC | PRN

## 2015-07-03 NOTE — Telephone Encounter (Signed)
Hydrocodone is being filled by Dr. Darene Lamer while Dr. Ileene Rubens is out of the office.

## 2015-07-05 ENCOUNTER — Encounter: Payer: Self-pay | Admitting: Osteopathic Medicine

## 2015-07-05 ENCOUNTER — Ambulatory Visit (INDEPENDENT_AMBULATORY_CARE_PROVIDER_SITE_OTHER): Payer: BLUE CROSS/BLUE SHIELD | Admitting: Osteopathic Medicine

## 2015-07-05 ENCOUNTER — Ambulatory Visit (INDEPENDENT_AMBULATORY_CARE_PROVIDER_SITE_OTHER): Payer: BLUE CROSS/BLUE SHIELD

## 2015-07-05 VITALS — BP 127/60 | HR 63 | Temp 98.3°F | Ht 65.0 in | Wt 161.0 lb

## 2015-07-05 DIAGNOSIS — R112 Nausea with vomiting, unspecified: Secondary | ICD-10-CM

## 2015-07-05 DIAGNOSIS — R195 Other fecal abnormalities: Secondary | ICD-10-CM | POA: Diagnosis not present

## 2015-07-05 LAB — POCT URINE PREGNANCY: Preg Test, Ur: NEGATIVE

## 2015-07-05 MED ORDER — ONDANSETRON 8 MG PO TBDP: 8.0000 mg | ORAL_TABLET | Freq: Three times a day (TID) | ORAL | Status: DC | PRN

## 2015-07-05 NOTE — Progress Notes (Signed)
HPI: Emily Ponce is a 49 y.o. female who presents to [facility] today for chief complaint of:  Chief Complaint  Patient presents with  . Emesis    . Location: feels lower in the belly.  . Duration: this time has been going on for 3 days, not throwing up today, threw up once yesterday before lunch and once on the day before then.  . Context: "Projectile vomiting and nausea" started iniitially after eating chicken nuggets, looks like digested food or clear liquid, no coffee grounds or blood no bile.  . Modifying factors: No OTC or home remedies, just eating light.  . Assoc signs/symptoms: fatigue. No sick contacts, no fever/chills. No pain with eating, no heartburn, no unintentional weight loss. A bit constipated chronically, no loose stool.    Past medical, social and family history reviewed: Past Medical History  Diagnosis Date  . Hypertension   . Ovarian cyst   . Broken neck ([place])     c-7   Past Surgical History  Procedure Laterality Date  . Carpal tunnel release Left   . Esure      Social History  Substance Use Topics  . Smoking status: Former Smoker    Quit date: 04/24/2010  . Smokeless tobacco: Not on file  . Alcohol Use: Yes     Comment: occasional   Family History  Problem Relation Age of Onset  . Heart attack Mother   . Hypertension Mother   . Alzheimer's disease Father   . Hypertension Father   . Cancer Sister     cervical    Current Outpatient Prescriptions  Medication Sig Dispense Refill  . ALPRAZolam (XANAX) 0.5 MG tablet TAKE 1/2 TO 1 TAB AT BED TIME 60 tablet 0  . atenolol (TENORMIN) 50 MG tablet Take 1 tablet (50 mg total) by mouth daily. 90 tablet 1  . B Complex Vitamins (VITAMIN-B COMPLEX) TABS Take 1 tablet by mouth.    . Flaxseed, Linseed, (FLAX SEEDS) POWD Take 1 tablet by mouth.    . hydrochlorothiazide (HYDRODIURIL) 25 MG tablet Take 1 tablet (25 mg total) by mouth daily. 90 tablet 1  .  HYDROcodone-acetaminophen (NORCO/VICODIN) 5-325 MG tablet Take 1-2 tablets by mouth every 4 (four) hours as needed for severe pain. Take with food. 60 tablet 0  . Omega-3 1000 MG CAPS Take 1 g by mouth.     No current facility-administered medications for this visit.   Allergies  Allergen Reactions  . Lyrica [Pregabalin]       Review of Systems: CONSTITUTIONAL:  No  fever, no chills, No  unintentional weight changes CARDIAC: No  chest pain, RESPIRATORY: No  cough, No  shortness of breath/wheeze GASTROINTESTINAL: Yes  nausea, Yes  vomiting, No  abdominal pain, No  blood in stool, No  diarrhea, Yes  constipation  GENITOURINARY: No  incontinence, No  abnormal genital bleeding/discharge   Exam:  BP 127/60 mmHg  Pulse 63  Temp(Src) 98.3 F (36.8 C) (Oral)  Ht 5\' 5"  (1.651 m)  Wt 161 lb (73.029 kg)  BMI 26.79 kg/m2 Constitutional: VS see above. General Appearance: alert, well-developed, well-nourished, NAD Respiratory: Normal respiratory effort. no wheeze, no rhonchi, no rales Cardiovascular: S1/S2 normal, no murmur, no rub/gallop auscultated. RRR.  Gastrointestinal: Nontender, no masses. No hepatomegaly, no splenomegaly. No hernia appreciated. Bowel sounds normal. Rectal exam deferred.  Musculoskeletal: Gait normal. No clubbing/cyanosis of digits.  Skin: warm, dry, intact.  Psychiatric: Normal judgment/insight. Normal mood and affect. Oriented x3.  No results found for this or any previous visit (from the past 72 hour(s)).    ASSESSMENT/PLAN: Would consider GI referral or further imaging if no better, but likely reaction to food vs viral gastritis. Pt advised f/u with Dr Ileene Rubens if no improvement. Mild diet receommended, avoid fatty/rich foods. Hx ovarian mass, following with OBGYN, may also consider followup with them  Non-intractable vomiting with nausea, unspecified vomiting type - Plan: CBC with Differential/Platelet, COMPLETE METABOLIC PANEL WITH GFR, TSH, Lipase,  ondansetron (ZOFRAN-ODT) 8 MG disintegrating tablet, DG Abd 1 View   Return if symptoms worsen or fail to improve.

## 2015-07-05 NOTE — Patient Instructions (Signed)

## 2015-07-06 LAB — COMPLETE METABOLIC PANEL WITH GFR
ALBUMIN: 4.3 g/dL (ref 3.6–5.1)
ALK PHOS: 51 U/L (ref 33–130)
ALT: 18 U/L (ref 6–29)
AST: 21 U/L (ref 10–35)
BILIRUBIN TOTAL: 0.4 mg/dL (ref 0.2–1.2)
BUN: 10 mg/dL (ref 7–25)
CALCIUM: 9 mg/dL (ref 8.6–10.4)
CO2: 31 mmol/L (ref 20–31)
Chloride: 96 mmol/L — ABNORMAL LOW (ref 98–110)
Creat: 0.75 mg/dL (ref 0.50–1.05)
Glucose, Bld: 70 mg/dL (ref 65–99)
Potassium: 3.4 mmol/L — ABNORMAL LOW (ref 3.5–5.3)
Sodium: 137 mmol/L (ref 135–146)
TOTAL PROTEIN: 6.8 g/dL (ref 6.1–8.1)

## 2015-07-06 LAB — CBC WITH DIFFERENTIAL/PLATELET
Basophils Absolute: 0 10*3/uL (ref 0.0–0.1)
Basophils Relative: 0 % (ref 0–1)
EOS ABS: 0.2 10*3/uL (ref 0.0–0.7)
Eosinophils Relative: 2 % (ref 0–5)
HCT: 40.7 % (ref 36.0–46.0)
HEMOGLOBIN: 14 g/dL (ref 12.0–15.0)
LYMPHS ABS: 2.9 10*3/uL (ref 0.7–4.0)
Lymphocytes Relative: 30 % (ref 12–46)
MCH: 32.6 pg (ref 26.0–34.0)
MCHC: 34.4 g/dL (ref 30.0–36.0)
MCV: 94.7 fL (ref 78.0–100.0)
MONOS PCT: 8 % (ref 3–12)
MPV: 11 fL (ref 8.6–12.4)
Monocytes Absolute: 0.8 10*3/uL (ref 0.1–1.0)
NEUTROS PCT: 60 % (ref 43–77)
Neutro Abs: 5.8 10*3/uL (ref 1.7–7.7)
Platelets: 236 10*3/uL (ref 150–400)
RBC: 4.3 MIL/uL (ref 3.87–5.11)
RDW: 13 % (ref 11.5–15.5)
WBC: 9.7 10*3/uL (ref 4.0–10.5)

## 2015-07-06 LAB — LIPASE: LIPASE: 13 U/L (ref 7–60)

## 2015-07-06 LAB — TSH: TSH: 2.026 u[IU]/mL (ref 0.350–4.500)

## 2015-07-27 ENCOUNTER — Ambulatory Visit (INDEPENDENT_AMBULATORY_CARE_PROVIDER_SITE_OTHER): Payer: BLUE CROSS/BLUE SHIELD | Admitting: Family Medicine

## 2015-07-27 ENCOUNTER — Encounter: Payer: Self-pay | Admitting: Family Medicine

## 2015-07-27 ENCOUNTER — Other Ambulatory Visit (HOSPITAL_COMMUNITY)
Admission: RE | Admit: 2015-07-27 | Discharge: 2015-07-27 | Disposition: A | Discharge status: Home / Self Care | Payer: BLUE CROSS/BLUE SHIELD | Source: Ambulatory Visit | Attending: Family Medicine | Admitting: Family Medicine

## 2015-07-27 VITALS — BP 113/69 | HR 67 | Wt 159.0 lb

## 2015-07-27 DIAGNOSIS — M542 Cervicalgia: Secondary | ICD-10-CM

## 2015-07-27 DIAGNOSIS — Z01419 Encounter for gynecological examination (general) (routine) without abnormal findings: Secondary | ICD-10-CM | POA: Insufficient documentation

## 2015-07-27 DIAGNOSIS — F411 Generalized anxiety disorder: Secondary | ICD-10-CM

## 2015-07-27 DIAGNOSIS — Z1151 Encounter for screening for human papillomavirus (HPV): Secondary | ICD-10-CM | POA: Diagnosis not present

## 2015-07-27 DIAGNOSIS — I10 Essential (primary) hypertension: Secondary | ICD-10-CM

## 2015-07-27 DIAGNOSIS — Z0000 Encounter for general adult medical examination without abnormal findings: Secondary | ICD-10-CM | POA: Diagnosis not present

## 2015-07-27 DIAGNOSIS — Z124 Encounter for screening for malignant neoplasm of cervix: Secondary | ICD-10-CM

## 2015-07-27 DIAGNOSIS — G8929 Other chronic pain: Secondary | ICD-10-CM

## 2015-07-27 LAB — LIPID PANEL
Cholesterol: 178 mg/dL (ref 125–200)
HDL: 49 mg/dL (ref >=46)
LDL CALC: 96 mg/dL (ref <130)
Total CHOL/HDL Ratio: 3.6 Ratio (ref <=5.0)
Triglycerides: 165 mg/dL — ABNORMAL HIGH (ref <150)
VLDL: 33 mg/dL — AB (ref <30)

## 2015-07-27 MED ORDER — ALPRAZOLAM 0.5 MG PO TABS: ORAL_TABLET | ORAL | Status: DC

## 2015-07-27 MED ORDER — ATENOLOL 50 MG PO TABS: 50.0000 mg | ORAL_TABLET | Freq: Every day | ORAL | Status: DC

## 2015-07-27 MED ORDER — HYDROCHLOROTHIAZIDE 25 MG PO TABS: 25.0000 mg | ORAL_TABLET | Freq: Every day | ORAL | Status: DC

## 2015-07-27 MED ORDER — HYDROCODONE-ACETAMINOPHEN 5-325 MG PO TABS: 1.0000 | ORAL_TABLET | ORAL | Status: DC | PRN

## 2015-07-27 NOTE — Progress Notes (Signed)
CC: Emily Ponce is a 49 y.o. female is here for Annual Exam   Subjective: HPI:  Colonoscopy: 10 year clearance from 06/2014  Papsmear: Due today Mammogram: Normal 05/2015  Influenza Vaccine: Declined Pneumovax: No current indication Td/Tdap: UTD from 07/2014 Zoster: (Start 52 yo)  Requesting complete physical exam with no acute complaints.  Review of Systems - General ROS: negative for - chills, fever, night sweats, weight gain or weight loss Ophthalmic ROS: negative for - decreased vision Psychological ROS: negative for - anxiety or depression ENT ROS: negative for - hearing change, nasal congestion, tinnitus or allergies Hematological and Lymphatic ROS: negative for - bleeding problems, bruising or swollen lymph nodes Breast ROS: negative Respiratory ROS: no cough, shortness of breath, or wheezing Cardiovascular ROS: no chest pain or dyspnea on exertion Gastrointestinal ROS: no abdominal pain, change in bowel habits, or black or bloody stools Genito-Urinary ROS: negative for - genital discharge, genital ulcers, incontinence or abnormal bleeding from genitals Musculoskeletal ROS: negative for - joint pain or muscle pain Neurological ROS: negative for - headaches or memory loss Dermatological ROS: negative for lumps, mole changes, rash and skin lesion changes  Past Medical History  Diagnosis Date  . Hypertension   . Ovarian cyst   . Broken neck ([place])     c-7    Past Surgical History  Procedure Laterality Date  . Carpal tunnel release Left   . Esure      Family History  Problem Relation Age of Onset  . Heart attack Mother   . Hypertension Mother   . Alzheimer's disease Father   . Hypertension Father   . Cancer Sister     cervical    Social History   Social History  . Marital Status: Married    Spouse Name: N/A  . Number of Children: N/A  . Years of Education: N/A   Occupational History  . [employer]    Social History Main Topics  . Smoking status:  Former Smoker    Quit date: 04/24/2010  . Smokeless tobacco: Not on file  . Alcohol Use: Yes     Comment: occasional  . Drug Use: No  . Sexual Activity:    Partners: Male   Other Topics Concern  . Not on file   Social History Narrative     Objective: BP 113/69 mmHg  Pulse 67  Wt 159 lb (72.122 kg)  LMP 06/05/2015  General: No Acute Distress HEENT: Atraumatic, normocephalic, conjunctivae normal without scleral icterus.  No nasal discharge, hearing grossly intact, TMs with good landmarks bilaterally with no middle ear abnormalities, posterior pharynx clear without oral lesions. Neck: Supple, trachea midline, no cervical nor supraclavicular adenopathy. Pulmonary: Clear to auscultation bilaterally without wheezing, rhonchi, nor rales. Cardiac: Regular rate and rhythm.  No murmurs, rubs, nor gallops. No peripheral edema.  2+ peripheral pulses bilaterally. Abdomen: Bowel sounds normal.  No masses.  Non-tender without rebound.  Negative Murphy's sign. GU: Labia majora & minora without lesions.  Distal urethra unremarkable.  Vaginal wall integrity preserved without mucosal lesions.  Cervix non-tender and without gross lesions nor discharge other than a nabothian cyst at the 12:00 position.   MSK: Grossly intact, no signs of weakness.  Full strength throughout upper and lower extremities.  Full ROM in upper and lower extremities.  No midline spinal tenderness. Neuro: Gait unremarkable, CN II-XII grossly intact.  C5-C6 Reflex 2/4 Bilaterally, L4 Reflex 2/4 Bilaterally.  Cerebellar function intact. Skin: No rashes. Psych: Alert and oriented to person/place/time.  Thought process normal. No anxiety/depression.  Assessment & Plan: Emily Ponce was seen today for annual exam.  Diagnoses and all orders for this visit:  Annual physical exam  Essential hypertension -     Lipid panel -     atenolol (TENORMIN) 50 MG tablet; Take 1 tablet (50 mg total) by mouth daily. -     hydrochlorothiazide  (HYDRODIURIL) 25 MG tablet; Take 1 tablet (25 mg total) by mouth daily.  Chronic neck pain -     HYDROcodone-acetaminophen (NORCO/VICODIN) 5-325 MG tablet; Take 1-2 tablets by mouth every 4 (four) hours as needed for severe pain. Take with food.  Generalized anxiety disorder  Screening for cervical cancer -     Cytology - PAP  Other orders -     ALPRAZolam (XANAX) 0.5 MG tablet; TAKE 1/2 TO 1 TAB AT BED TIME  Healthy lifestyle interventions including but not limited to regular exercise, a healthy low fat diet, moderation of salt intake, the dangers of tobacco/alcohol/recreational drug use, nutrition supplementation, and accident avoidance were discussed with the patient and a handout was provided for future reference. She is requesting refills on all of her medications, appropriate.   Return in about 3 months (around 10/25/2015), or if symptoms worsen or fail to improve.

## 2015-07-30 LAB — CYTOLOGY - PAP

## 2015-08-01 ENCOUNTER — Encounter: Payer: BLUE CROSS/BLUE SHIELD | Admitting: Family Medicine

## 2015-08-02 ENCOUNTER — Encounter: Payer: Self-pay | Admitting: Family Medicine

## 2015-08-20 ENCOUNTER — Other Ambulatory Visit: Payer: Self-pay | Admitting: Family Medicine

## 2015-08-20 NOTE — Telephone Encounter (Signed)
Request too early

## 2015-08-21 ENCOUNTER — Telehealth: Payer: Self-pay

## 2015-08-21 DIAGNOSIS — M542 Cervicalgia: Principal | ICD-10-CM

## 2015-08-21 DIAGNOSIS — G8929 Other chronic pain: Secondary | ICD-10-CM

## 2015-08-22 MED ORDER — HYDROCODONE-ACETAMINOPHEN 5-325 MG PO TABS: 1.0000 | ORAL_TABLET | ORAL | Status: DC | PRN

## 2015-08-22 NOTE — Telephone Encounter (Signed)
Emily Ponce, Rx placed in in-box ready for pickup/faxing.  

## 2015-08-22 NOTE — Telephone Encounter (Signed)
Rx faxed

## 2015-09-19 ENCOUNTER — Other Ambulatory Visit: Payer: Self-pay

## 2015-09-19 DIAGNOSIS — M542 Cervicalgia: Principal | ICD-10-CM

## 2015-09-19 DIAGNOSIS — G8929 Other chronic pain: Secondary | ICD-10-CM

## 2015-09-19 MED ORDER — HYDROCODONE-ACETAMINOPHEN 5-325 MG PO TABS: 1.0000 | ORAL_TABLET | ORAL | Status: DC | PRN

## 2015-10-18 ENCOUNTER — Other Ambulatory Visit: Payer: Self-pay

## 2015-10-18 DIAGNOSIS — M542 Cervicalgia: Principal | ICD-10-CM

## 2015-10-18 DIAGNOSIS — G8929 Other chronic pain: Secondary | ICD-10-CM

## 2015-10-18 MED ORDER — HYDROCODONE-ACETAMINOPHEN 5-325 MG PO TABS: 1.0000 | ORAL_TABLET | ORAL | Status: DC | PRN

## 2015-10-24 ENCOUNTER — Ambulatory Visit (INDEPENDENT_AMBULATORY_CARE_PROVIDER_SITE_OTHER): Payer: BLUE CROSS/BLUE SHIELD | Admitting: Family Medicine

## 2015-10-24 ENCOUNTER — Encounter: Payer: Self-pay | Admitting: Family Medicine

## 2015-10-24 VITALS — BP 128/83 | HR 67 | Wt 162.0 lb

## 2015-10-24 DIAGNOSIS — M25511 Pain in right shoulder: Secondary | ICD-10-CM | POA: Diagnosis not present

## 2015-10-24 MED ORDER — METHOCARBAMOL 750 MG PO TABS: 750.0000 mg | ORAL_TABLET | Freq: Three times a day (TID) | ORAL | Status: DC | PRN

## 2015-10-24 NOTE — Progress Notes (Signed)
CC: Emily Ponce is a 50 y.o. female is here for Shoulder Pain   Subjective: HPI:  For the past 2 weeks she's been experiencing right shoulder pain that she localizes in the back of the shoulder and is nonradiating. It's worse when lying on the right side but improves with deep tissue massages. No interventions as of yet other than above. It's mild in severity but interferes with her sleep. She denies any influence on the pain with movement of her arm or positional maneuvers. She denies any overlying skin changes or recent trauma. No shortness of breath or cough.   Review Of Systems Outlined In HPI  Past Medical History  Diagnosis Date  . Hypertension   . Ovarian cyst   . Broken neck ([place])     c-7    Past Surgical History  Procedure Laterality Date  . Carpal tunnel release Left   . Esure      Family History  Problem Relation Age of Onset  . Heart attack Mother   . Hypertension Mother   . Alzheimer's disease Father   . Hypertension Father   . Cancer Sister     cervical    Social History   Social History  . Marital Status: Married    Spouse Name: N/A  . Number of Children: N/A  . Years of Education: N/A   Occupational History  . [employer]    Social History Main Topics  . Smoking status: Former Smoker    Quit date: 04/24/2010  . Smokeless tobacco: Not on file  . Alcohol Use: Yes     Comment: occasional  . Drug Use: No  . Sexual Activity:    Partners: Male   Other Topics Concern  . Not on file   Social History Narrative     Objective: BP 128/83 mmHg  Pulse 67  Wt 162 lb (73.483 kg)  Vital signs reviewed. General: Alert and Oriented, No Acute Distress HEENT: Pupils equal, round, reactive to light. Conjunctivae clear.  External ears unremarkable.  Moist mucous membranes. Lungs: Clear and comfortable work of breathing, speaking in full sentences without accessory muscle use. No wheezing rhonchi or rales Cardiac: Regular rate and rhythm.  Back: Her  pain is localized to the medial border of the scapula Neuro: CN II-XII grossly intact, gait normal. Extremities: No peripheral edema.  Strong peripheral pulses.  Mental Status: No depression, anxiety, nor agitation. Logical though process. Skin: Warm and dry. No overlying skin changes at the site of her discomfort  Assessment & Plan: Emily Ponce was seen today for shoulder pain.  Diagnoses and all orders for this visit:  Right shoulder pain  Other orders -     methocarbamol (ROBAXIN) 750 MG tablet; Take 1 tablet (750 mg total) by mouth every 8 (eight) hours as needed (back pain).   Right shoulder pain most likely due to rhomboid strain, she was instructed on stretches to help rehabilitation this and should consider taking ibuprofen and a prescription today of Robaxin  Return if symptoms worsen or fail to improve.

## 2015-10-30 ENCOUNTER — Encounter: Payer: Self-pay | Admitting: Family Medicine

## 2015-10-30 ENCOUNTER — Ambulatory Visit (INDEPENDENT_AMBULATORY_CARE_PROVIDER_SITE_OTHER): Payer: BLUE CROSS/BLUE SHIELD | Admitting: Family Medicine

## 2015-10-30 ENCOUNTER — Ambulatory Visit (INDEPENDENT_AMBULATORY_CARE_PROVIDER_SITE_OTHER): Payer: BLUE CROSS/BLUE SHIELD

## 2015-10-30 VITALS — BP 143/85 | HR 72 | Wt 164.0 lb

## 2015-10-30 DIAGNOSIS — M542 Cervicalgia: Secondary | ICD-10-CM

## 2015-10-30 DIAGNOSIS — G8929 Other chronic pain: Secondary | ICD-10-CM

## 2015-10-30 DIAGNOSIS — M5412 Radiculopathy, cervical region: Secondary | ICD-10-CM | POA: Diagnosis not present

## 2015-10-30 MED ORDER — BACLOFEN 10 MG PO TABS: 10.0000 mg | ORAL_TABLET | Freq: Three times a day (TID) | ORAL | Status: DC

## 2015-10-30 MED ORDER — HYDROCODONE-ACETAMINOPHEN 5-325 MG PO TABS: 1.0000 | ORAL_TABLET | ORAL | Status: DC | PRN

## 2015-10-30 NOTE — Progress Notes (Signed)
   Subjective:    I'm seeing this patient as a consultation for:  Dr. Ileene Rubens  CC: Neck pain  HPI: Patient has a history of chronic intermittent neck pain. However recently over the past few weeks she's had worsening right sided neck pain. The pain radiates to her trapezius and thoracic spine. However recently the pain began radiating down to her forearm. She denies any weakness or numbness or loss of function. She's been recently seen by her PCP who prescribed Robaxin which did not help at all.  Patient notes a history of fracture to the seventh cervical vertebrae 20 years ago. This reportedly did not require fusion surgery.  Patient notes that she has previously tried Flexeril which has not been helpful in the past. Additionally she is reluctant to take steroids due to weight gain.  Past medical history, Surgical history, Family history not pertinant except as noted below, Social history, Allergies, and medications have been entered into the medical record, reviewed, and no changes needed.   Review of Systems: No headache, visual changes, nausea, vomiting, diarrhea, constipation, dizziness, abdominal pain, skin rash, fevers, chills, night sweats, weight loss, swollen lymph nodes, body aches, joint swelling, muscle aches, chest pain, shortness of breath, mood changes, visual or auditory hallucinations.   Objective:    Filed Vitals:   10/30/15 1303  BP: 143/85  Pulse: 72   General: Well Developed, well nourished, and in no acute distress.  Neuro/Psych: Alert and oriented x3, extra-ocular muscles intact, able to move all 4 extremities, sensation grossly intact. Skin: Warm and dry, no rashes noted.  Respiratory: Not using accessory muscles, speaking in full sentences, trachea midline.  Cardiovascular: Pulses palpable, no extremity edema. Abdomen: Does not appear distended. MSK: Spine nontender to thoracic and cervical spine midline. Tender palpation right trapezius, C and T-spine paraspinals  and right rhomboid. Normal neck motion however positive right-sided Spurling's test. Upper extremity strength is equal and normal throughout. Reflexes are equal and normal bilateral upper extremities. Pulses intact bilateral upper extremities.     No results found for this or any previous visit (from the past 24 hour(s)). Dg Cervical Spine Complete  10/30/2015  CLINICAL DATA:  Right-sided neck pain radiating into the right shoulder for the past several days ; history of C7 fracture 20 years ago. EXAM: CERVICAL SPINE - COMPLETE 4+ VIEW COMPARISON:  None in PACs FINDINGS: The cervical vertebral bodies are preserved in height. The disc space heights are well maintained. There is no spondylolisthesis. There is no perched facet. No acute spinous process fracture is observed. No high-grade bony encroachment upon the neural foramina is demonstrated. The odontoid is intact. The prevertebral soft tissue spaces are normal. IMPRESSION: No acute or significant chronic bony abnormality of the cervical spine is demonstrated. Electronically Signed   By: David  Martinique M.D.   On: 10/30/2015 13:34    Impression and Recommendations:   50 year old woman with history of chronic neck pain with worsening exacerbation with cervical radiculopathy. Review of x-ray shows Loss of normal cervical lordosis but no obvious other issues. Plan for physical therapy TENS unit heating pad. If not better will obtain MRI for epidural steroid injection planning.  This case required medical decision making of moderate complexity.

## 2015-10-30 NOTE — Patient Instructions (Signed)
Thank you for coming in today. Come back or go to the emergency room if you notice new weakness new numbness problems walking or bowel or bladder problems.  Return in 2-4 weeks if not better.  Attend PT.    TENS UNIT: This is helpful for muscle pain and spasm.   Search and Purchase a TENS 7000 2nd edition at www.tenspros.com. It should be less than $30.     TENS unit instructions: Do not shower or bathe with the unit on Turn the unit off before removing electrodes or batteries If the electrodes lose stickiness add a drop of water to the electrodes after they are disconnected from the unit and place on plastic sheet. If you continued to have difficulty, call the TENS unit company to purchase more electrodes. Do not apply lotion on the skin area prior to use. Make sure the skin is clean and dry as this will help prolong the life of the electrodes. After use, always check skin for unusual red areas, rash or other skin difficulties. If there are any skin problems, does not apply electrodes to the same area. Never remove the electrodes from the unit by pulling the wires. Do not use the TENS unit or electrodes other than as directed. Do not change electrode placement without consultating your therapist or physician. Keep 2 fingers with between each electrode. Wear time ratio is 2:1, on to off times.    For example on for 30 minutes off for 15 minutes and then on for 30 minutes off for 15 minutes   Cervical Radiculopathy Cervical radiculopathy happens when a nerve in the neck (cervical nerve) is pinched or bruised. This condition can develop because of an injury or as part of the normal aging process. Pressure on the cervical nerves can cause pain or numbness that runs from the neck all the way down into the arm and fingers. Usually, this condition gets better with rest. Treatment may be needed if the condition does not improve.  CAUSES This condition may be caused by:  Injury.  Slipped  (herniated) disk.  Muscle tightness in the neck because of overuse.  Arthritis.  Breakdown or degeneration in the bones and joints of the spine (spondylosis) due to aging.  Bone spurs that may develop near the cervical nerves. SYMPTOMS Symptoms of this condition include:  Pain that runs from the neck to the arm and hand. The pain can be severe or irritating. It may be worse when the neck is moved.  Numbness or weakness in the affected arm and hand. DIAGNOSIS This condition may be diagnosed based on symptoms, medical history, and a physical exam. You may also have tests, including:  X-rays.  CT scan.  MRI.  Electromyogram (EMG).  Nerve conduction tests. TREATMENT In many cases, treatment is not needed for this condition. With rest, the condition usually gets better over time. If treatment is needed, options may include:  Wearing a soft neck collar for short periods of time.  Physical therapy to strengthen your neck muscles.  Medicines, such as NSAIDs, oral corticosteroids, or spinal injections.  Surgery. This may be needed if other treatments do not help. Various types of surgery may be done depending on the cause of your problems. HOME CARE INSTRUCTIONS Managing Pain  Take over-the-counter and prescription medicines only as told by your health care provider.  If directed, apply ice to the affected area.  Put ice in a plastic bag.  Place a towel between your skin and the bag.  Leave the ice on for 20 minutes, 2-3 times per day.  If ice does not help, you can try using heat. Take a warm shower or warm bath, or use a heat pack as told by your health care provider.  Try a gentle neck and shoulder massage to help relieve symptoms. Activity  Rest as needed. Follow instructions from your health care provider about any restrictions on activities.  Do stretching and strengthening exercises as told by your health care provider or physical therapist. General  Instructions  If you were given a soft collar, wear it as told by your health care provider.  Use a flat pillow when you sleep.  Keep all follow-up visits as told by your health care provider. This is important. SEEK MEDICAL CARE IF:  Your condition does not improve with treatment. SEEK IMMEDIATE MEDICAL CARE IF:  Your pain gets much worse and cannot be controlled with medicines.  You have weakness or numbness in your hand, arm, face, or leg.  You have a high fever.  You have a stiff, rigid neck.  You lose control of your bowels or your bladder (have incontinence).  You have trouble with walking, balance, or speaking.   This information is not intended to replace advice given to you by your health care provider. Make sure you discuss any questions you have with your health care provider.   Document Released: 03/18/2001 Document Revised: 03/14/2015 Document Reviewed: 08/17/2014 Elsevier Interactive Patient Education [employer].

## 2015-10-30 NOTE — Progress Notes (Signed)
Quick Note:  Xray shows a pretty normal appearing spine like we talked about. ______

## 2015-11-08 ENCOUNTER — Telehealth: Payer: Self-pay

## 2015-11-08 ENCOUNTER — Ambulatory Visit (INDEPENDENT_AMBULATORY_CARE_PROVIDER_SITE_OTHER): Payer: BLUE CROSS/BLUE SHIELD | Admitting: Rehabilitative and Restorative Service Providers"

## 2015-11-08 ENCOUNTER — Encounter: Payer: Self-pay | Admitting: Rehabilitative and Restorative Service Providers"

## 2015-11-08 DIAGNOSIS — M5412 Radiculopathy, cervical region: Secondary | ICD-10-CM | POA: Diagnosis not present

## 2015-11-08 DIAGNOSIS — R293 Abnormal posture: Secondary | ICD-10-CM | POA: Diagnosis not present

## 2015-11-08 DIAGNOSIS — R29898 Other symptoms and signs involving the musculoskeletal system: Secondary | ICD-10-CM | POA: Diagnosis not present

## 2015-11-08 DIAGNOSIS — M791 Myalgia, unspecified site: Secondary | ICD-10-CM

## 2015-11-08 DIAGNOSIS — G8929 Other chronic pain: Secondary | ICD-10-CM

## 2015-11-08 DIAGNOSIS — M542 Cervicalgia: Principal | ICD-10-CM

## 2015-11-08 MED ORDER — HYDROCODONE-ACETAMINOPHEN 5-325 MG PO TABS: 1.0000 | ORAL_TABLET | Freq: Four times a day (QID) | ORAL | Status: DC | PRN

## 2015-11-08 NOTE — Telephone Encounter (Signed)
Pt walked into clinic requesting a refill on HYDROcodone-acetaminophen (NORCO/VICODIN) 5-325 MG tablet. This rx was written on 10/30/2015, 9 days ago. Please advise.

## 2015-11-08 NOTE — Telephone Encounter (Signed)
Will refill. Pt to schedule with me soon. She is to reduce amount of pain medicines she is using. We may need to refer to pain management. The RX should last 2 weeks.

## 2015-11-08 NOTE — Patient Instructions (Signed)
Axial Extension (Chin Tuck)    Pull chin in and lengthen back of neck. Hold _10___ seconds while counting out loud. Repeat __5__ times. Do _several ___ sessions per day.   Shoulder Blade Squeeze    Rotate shoulders back, then squeeze shoulder blades down and back. Hold 10 sec Repeat __10__ times. Do _several___ sessions per day. Can use swim noodle along spine for tactile cue   Self massage using a 4 inch rubber ball  TENS UNIT: This is helpful for muscle pain and spasm.   Search and Purchase a TENS 7000 2nd edition at www.tenspros.com. It should be less than $30.     TENS unit instructions: Do not shower or bathe with the unit on Turn the unit off before removing electrodes or batteries If the electrodes lose stickiness add a drop of water to the electrodes after they are disconnected from the unit and place on plastic sheet. If you continued to have difficulty, call the TENS unit company to purchase more electrodes. Do not apply lotion on the skin area prior to use. Make sure the skin is clean and dry as this will help prolong the life of the electrodes. After use, always check skin for unusual red areas, rash or other skin difficulties. If there are any skin problems, does not apply electrodes to the same area. Never remove the electrodes from the unit by pulling the wires. Do not use the TENS unit or electrodes other than as directed. Do not change electrode placement without consultating your therapist or physician. Keep 2 fingers with between each electrode.   Sleeping on Back  Place pillow under knees. A pillow with cervical support and a roll around waist are also helpful. Copyright  VHI. All rights reserved.  Sleeping on Side Place pillow between knees. Use cervical support under neck and a roll around waist as needed. Copyright  VHI. All rights reserved.   Sleeping on Stomach   If this is the only desirable sleeping position, place pillow under lower legs,  and under stomach or chest as needed.  Posture - Sitting   Sit upright, head facing forward. Try using a roll to support lower back. Keep shoulders relaxed, and avoid rounded back. Keep hips level with knees. Avoid crossing legs for long periods. Stand to Sit / Sit to Stand   To sit: Bend knees to lower self onto front edge of chair, then scoot back on seat. To stand: Reverse sequence by placing one foot forward, and scoot to front of seat. Use rocking motion to stand up.   Work Height and Reach  Ideal work height is no more than 2 to 4 inches below elbow level when standing, and at elbow level when sitting. Reaching should be limited to arm's length, with elbows slightly bent.  Bending  Bend at hips and knees, not back. Keep feet shoulder-width apart.    Posture - Standing   Good posture is important. Avoid slouching and forward head thrust. Maintain curve in low back and align ears over shoul- ders, hips over ankles.  Alternating Positions   Alternate tasks and change positions frequently to reduce fatigue and muscle tension. Take rest breaks. Computer Work   Position work to Programmer, multimedia. Use proper work and seat height. Keep shoulders back and down, wrists straight, and elbows at right angles. Use chair that provides full back support. Add footrest and lumbar roll as needed.  Getting Into / Out of Car  Lower self onto seat, scoot back, then  bring in one leg at a time. Reverse sequence to get out.  Dressing  Lie on back to pull socks or slacks over feet, or sit and bend leg while keeping back straight.    Housework - Sink  Place one foot on ledge of cabinet under sink when standing at sink for prolonged periods.   Pushing / Pulling  Pushing is preferable to pulling. Keep back in proper alignment, and use leg muscles to do the work.  Deep Squat   Squat and lift with both arms held against upper trunk. Tighten stomach muscles without holding breath. Use  smooth movements to avoid jerking.  Avoid Twisting   Avoid twisting or bending back. Pivot around using foot movements, and bend at knees if needed when reaching for articles.  Carrying Luggage   Distribute weight evenly on both sides. Use a cart whenever possible. Do not twist trunk. Move body as a unit.   Lifting Principles .Maintain proper posture and head alignment. .Slide object as close as possible before lifting. .Move obstacles out of the way. .Test before lifting; ask for help if too heavy. .Tighten stomach muscles without holding breath. .Use smooth movements; do not jerk. .Use legs to do the work, and pivot with feet. .Distribute the work load symmetrically and close to the center of trunk. .Push instead of pull whenever possible.   Ask For Help   Ask for help and delegate to others when possible. Coordinate your movements when lifting together, and maintain the low back curve.  Log Roll   Lying on back, bend left knee and place left arm across chest. Roll all in one movement to the right. Reverse to roll to the left. Always move as one unit. Housework - Sweeping  Use long-handled equipment to avoid stooping.   Housework - Wiping  Position yourself as close as possible to reach work surface. Avoid straining your back.  Laundry - Unloading Wash   To unload small items at bottom of washer, lift leg opposite to arm being used to reach.  [place] close to area to be raked. Use arm movements to do the work. Keep back straight and avoid twisting.     Cart  When reaching into cart with one arm, lift opposite leg to keep back straight.   Getting Into / Out of Bed  Lower self to lie down on one side by raising legs and lowering head at the same time. Use arms to assist moving without twisting. Bend both knees to roll onto back if desired. To sit up, start from lying on side, and use same move-ments in reverse. Housework - Vacuuming  Hold  the vacuum with arm held at side. Step back and forth to move it, keeping head up. Avoid twisting.   Laundry - IT consultant so that bending and twisting can be avoided.   Laundry - Unloading Dryer  Squat down to reach into clothes dryer or use a reacher.  Gardening - Weeding / Probation officer or Kneel. Knee pads may be helpful.
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
## 2015-11-09 NOTE — Telephone Encounter (Signed)
Pt came into clinic to pickup rx. Dr. Georgina Snell discussed this rx face to face with pt.

## 2015-11-15 ENCOUNTER — Encounter: Payer: Self-pay | Admitting: Rehabilitative and Restorative Service Providers"

## 2015-11-15 ENCOUNTER — Ambulatory Visit (INDEPENDENT_AMBULATORY_CARE_PROVIDER_SITE_OTHER): Payer: BLUE CROSS/BLUE SHIELD | Admitting: Rehabilitative and Restorative Service Providers"

## 2015-11-15 DIAGNOSIS — M791 Myalgia, unspecified site: Secondary | ICD-10-CM

## 2015-11-15 DIAGNOSIS — R293 Abnormal posture: Secondary | ICD-10-CM | POA: Diagnosis not present

## 2015-11-15 DIAGNOSIS — M5412 Radiculopathy, cervical region: Secondary | ICD-10-CM

## 2015-11-15 DIAGNOSIS — R29898 Other symptoms and signs involving the musculoskeletal system: Secondary | ICD-10-CM | POA: Diagnosis not present

## 2015-11-15 NOTE — Therapy (Signed)
[place], [place], [address] Phone: [phone]   Fax:  [phone]  Physical Therapy Treatment  Patient Details  Name: Emily Ponce MRN: [id number] Date of Birth: 08/04/1965 Referring Provider: Dr. Lynne Leader   Encounter Date: 11/15/2015      PT End of Session - 11/15/15 1804    Visit Number 2   Number of Visits 12   Date for PT Re-Evaluation 12/20/15   PT Start Time [id number]   PT Stop Time 1725   PT Time Calculation (min) 64 min   Activity Tolerance Patient tolerated treatment well      Past Medical History  Diagnosis Date  . Hypertension   . Ovarian cyst   . Broken neck ([place])     c-7    Past Surgical History  Procedure Laterality Date  . Carpal tunnel release Left   . Esure       There were no vitals filed for this visit.      Subjective Assessment - 11/15/15 1625    Subjective Shoulder blade is killing her today - in a lot of meeetings and has a lot of tension through the shoulder area today. Does her exercises and is in the gym several days/week   Currently in Pain? Yes   Pain Score 7    Pain Location Shoulder   Pain Orientation Right   Pain Descriptors / Indicators Burning;Throbbing   Pain Type Acute pain   Pain Onset More than a month ago   Pain Frequency Constant                         [facility] Adult PT Treatment/Exercise - 11/15/15 0001    Shoulder Exercises: Standing   Other Standing Exercises axial extension 10 sec x 5   Other Standing Exercises scap squeeze with noodle 10 sec x 5    Shoulder Exercises: Stretch   Other Shoulder Stretches 3 way doorway stretch 2 positions 30 sec hold    Moist Heat Therapy   Number Minutes Moist Heat 15 Minutes   Moist Heat Location Shoulder;Cervical   Electrical Stimulation   Electrical Stimulation Location Rt cervical and scapular area    Electrical Stimulation Action IFC   Electrical Stimulation Parameters  to tolerance    Electrical Stimulation Goals Pain;Tone  muscular tightness    Manual Therapy   Manual therapy comments patient supine LE's supported on bolster   Soft tissue mobilization working through ant/lat/post cervical musculature; pecs; upper trap; leveator Rt > Lt    Myofascial Release chest; upper trap    Manual Traction cervical traction 30-60 sec x 5           Trigger Point Dry Needling - 11/15/15 1802    Consent Given? Yes   Education Handout Provided Yes   Levator Scapulae Response Twitch response elicited;Palpable increased muscle length  Rt - close to scapular border    Longissimus Response Palpable increased muscle length  deep ache               PT Education - 11/15/15 1649    Education provided Yes   Education Details TDN    Person(s) Educated Patient   Methods Explanation   Comprehension Verbalized understanding             PT Long Term Goals - 11/15/15 1808    PT LONG TERM GOAL #1   Title Improve posture and alignment with patient  to demonstrate upright posture with improved position of scapulae on thoracic wall 12/20/15   Time 6   Period Weeks   Status On-going   PT LONG TERM GOAL #2   Title Increase Rt shoudler ROM to equal or greater than AROM Lt shoulder 12/20/15   Time 6   Period Weeks   Status On-going   PT LONG TERM GOAL #3   Title Improve tissue extensibility through Rt upper quarter to palpatioin and per pt report thus improving mobility and function 12/20/15   Time 6   Period Weeks   Status On-going   PT LONG TERM GOAL #4   Title Independent in HEP 12/20/15   Time 6   Period Weeks   Status On-going   PT LONG TERM GOAL #5   Title Improve FOTO to </+ 32% limitation 12/20/15   Time 6   Period Weeks   Status On-going               Plan - 11/15/15 1805    Clinical Impression Statement Significant musculat tightness through Rt > Lt cervical and throacic musculature. Patient had some deep ache with TDN trial and noted  some soreness following treatment. She did have improved tightness through cervical paraspinals and Rt leveator area(which received TDN). Longstanding problems will likelybe slow to respond to intervention. No goals of therapy accomplished. Only 2nd visit.    Rehab Potential Good   PT Frequency 2x / week   PT Duration 6 weeks   PT Treatment/Interventions Patient/family education;ADLs/Self Care Home Management;Neuromuscular re-education;Manual techniques;Dry needling;Cryotherapy;Electrical Stimulation;Iontophoresis 4mg /ml Dexamethasone;Moist Heat;Ultrasound;Traction;Therapeutic activities;Therapeutic exercise   PT Next Visit Plan TDN; manual therapy; postural correction; gentle stretching as tolerated; posterior shoulder girdle strengthening; modalities as indicated - may try [place] to Rt cervical and shoulder area - assess response to TDN    PT Home Exercise Plan HEP; TENS info; spine care handout; myofacial ball release    Consulted and Agree with Plan of Care Patient      Patient will benefit from skilled therapeutic intervention in order to improve the following deficits and impairments:  Postural dysfunction, Improper body mechanics, Pain, Increased fascial restricitons, Increased muscle spasms, Decreased range of motion, Decreased mobility, Decreased endurance, Decreased activity tolerance  Visit Diagnosis: Cervical radiculopathy  Other symptoms and signs involving the musculoskeletal system  Abnormal posture  Myalgia     Problem List Patient Active Problem List   Diagnosis Date Noted  . Cervical radiculitis 10/30/2015  . Ovarian mass 01/05/2015  . Fibroid 01/05/2015  . Right foot pain 11/17/2014  . Hyperlipidemia 07/26/2014  . Preventive measure 07/06/2014  . 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  . Essential hypertension 04/24/2014  . Chronic headaches 04/24/2014  . Generalized anxiety disorder 04/24/2014  . Chronic neck pain 04/24/2014  . Chronic cervical pain 01/20/2014  . Headache,  migraine 07/17/2012  . Aneurysm ([place]) 05/19/2011    Emily Ponce Emily Ponce PT, MPH  11/15/2015, 6:09 PM  [facility] [place], [place], [address] Phone: [phone]   Fax:  [phone]  Name: Emily Ponce MRN: [id number] Date of Birth: 1966/03/13

## 2015-11-15 NOTE — Patient Instructions (Signed)

## 2015-11-21 ENCOUNTER — Telehealth: Payer: Self-pay

## 2015-11-21 DIAGNOSIS — M542 Cervicalgia: Principal | ICD-10-CM

## 2015-11-21 DIAGNOSIS — G8929 Other chronic pain: Secondary | ICD-10-CM

## 2015-11-21 NOTE — Telephone Encounter (Signed)
Pt called requesting a refill of hydrocodone stating that she is going out of town and would need it by Friday. Please review the medication history for this pt and advise if its appropriate.

## 2015-11-22 ENCOUNTER — Ambulatory Visit (INDEPENDENT_AMBULATORY_CARE_PROVIDER_SITE_OTHER): Payer: BLUE CROSS/BLUE SHIELD | Admitting: Rehabilitative and Restorative Service Providers"

## 2015-11-22 ENCOUNTER — Encounter: Payer: Self-pay | Admitting: Rehabilitative and Restorative Service Providers"

## 2015-11-22 DIAGNOSIS — R29898 Other symptoms and signs involving the musculoskeletal system: Secondary | ICD-10-CM | POA: Diagnosis not present

## 2015-11-22 DIAGNOSIS — M791 Myalgia, unspecified site: Secondary | ICD-10-CM

## 2015-11-22 DIAGNOSIS — R293 Abnormal posture: Secondary | ICD-10-CM

## 2015-11-22 DIAGNOSIS — M5412 Radiculopathy, cervical region: Secondary | ICD-10-CM

## 2015-11-22 MED ORDER — HYDROCODONE-ACETAMINOPHEN 5-325 MG PO TABS: 1.0000 | ORAL_TABLET | Freq: Four times a day (QID) | ORAL | Status: DC | PRN

## 2015-11-22 NOTE — Telephone Encounter (Signed)
Will refill Norco for 2 weeks only. At that point Dr. Ileene Rubens should take over pain medication duties or refer to pain management as this is now chronic pain

## 2015-11-22 NOTE — Telephone Encounter (Signed)
Patient is hoping to pick up prescription around 3:30.

## 2015-11-22 NOTE — Therapy (Signed)
[place], [place], [address] Phone: [phone]   Fax:  [phone]  Physical Therapy Treatment  Patient Details  Name: Emily Ponce MRN: [id number] Date of Birth: 06-07-1966 Referring Provider: Dr. Lynne Leader   Encounter Date: 11/22/2015      PT End of Session - 11/22/15 1520    Visit Number 3   Number of Visits 12   Date for PT Re-Evaluation 12/20/15   PT Start Time [id number]   PT Stop Time 1601  patient had to leave early for appt    PT Time Calculation (min) 35 min   Activity Tolerance Patient tolerated treatment well      Past Medical History  Diagnosis Date  . Hypertension   . Ovarian cyst   . Broken neck ([place])     c-7    Past Surgical History  Procedure Laterality Date  . Carpal tunnel release Left   . Esure       There were no vitals filed for this visit.      Subjective Assessment - 11/22/15 1521    Subjective No significan change with PT - she was sore from TDN but did not notice any difference afterward. Working on her exercises at home and at work "as much as she can"    Currently in Pain? Yes   Pain Score 6    Pain Location Shoulder   Pain Orientation Right   Pain Descriptors / Indicators Burning;Throbbing   Pain Type Acute pain   Pain Onset More than a month ago   Pain Frequency Constant                         [facility] Adult PT Treatment/Exercise - 11/22/15 0001    Shoulder Exercises: Supine   Other Supine Exercises chin tuck 10 sec x 5   Shoulder Exercises: Standing   Other Standing Exercises axial extension 10 sec x 5   Other Standing Exercises scap squeeze with noodle 10 sec x 5    Shoulder Exercises: Stretch   Other Shoulder Stretches 3 way doorway stretch 2 positions 30 sec hold    Moist Heat Therapy   Number Minutes Moist Heat 5 Minutes   Moist Heat Location Shoulder;Cervical   Electrical Stimulation   Electrical Stimulation  Location --   Electrical Stimulation Action --   Electrical Stimulation Parameters --   Electrical Stimulation Goals --   Ultrasound   Ultrasound Location Rt infraspinatius; periscapular musculature    Ultrasound Parameters 1 mHz; 1.5 w/cm2; 100%; 8 min    Ultrasound Goals Pain;Other (Comment)  muscular tightness    Manual Therapy   Manual therapy comments patient supine LE's supported on bolster; prone w/ arms at sides    Soft tissue mobilization working through ant/lat/post cervical musculature; pecs; upper trap; leveator Rt > Lt    Myofascial Release chest; upper trap    Manual Traction cervical traction 30-60 sec x 5                      PT Long Term Goals - 11/15/15 1808    PT LONG TERM GOAL #1   Title Improve posture and alignment with patient to demonstrate upright posture with improved position of scapulae on thoracic wall 12/20/15   Time 6   Period Weeks   Status On-going   PT LONG TERM GOAL #2   Title Increase Rt shoudler ROM to equal  or greater than AROM Lt shoulder 12/20/15   Time 6   Period Weeks   Status On-going   PT LONG TERM GOAL #3   Title Improve tissue extensibility through Rt upper quarter to palpatioin and per pt report thus improving mobility and function 12/20/15   Time 6   Period Weeks   Status On-going   PT LONG TERM GOAL #4   Title Independent in HEP 12/20/15   Time 6   Period Weeks   Status On-going   PT LONG TERM GOAL #5   Title Improve FOTO to </+ 32% limitation 12/20/15   Time 6   Period Weeks   Status On-going               Plan - 11/22/15 1608    Clinical Impression Statement longstanding muscular tightness and poor movement patterns/poor posture will be slower to respond to intervention. Significant muscular tightness noted through the Rt periscapular musculature and infraspinatus/supraspinatus. Will benefit from  another trial of TDN.    Rehab Potential Good   PT Frequency 2x / week   PT Duration 6 weeks   PT  Treatment/Interventions Patient/family education;ADLs/Self Care Home Management;Neuromuscular re-education;Manual techniques;Dry needling;Cryotherapy;Electrical Stimulation;Iontophoresis 4mg /ml Dexamethasone;Moist Heat;Ultrasound;Traction;Therapeutic activities;Therapeutic exercise   PT Next Visit Plan TDN; manual therapy; postural correction; gentle stretching as tolerated; posterior shoulder girdle strengthening; modalities as indicated - may try [place] to Rt cervical and shoulder area -  TDN to infra/supraspinatus/leveator    PT Home Exercise Plan HEP; TENS info; spine care handout; myofacial ball release    Consulted and Agree with Plan of Care Patient      Patient will benefit from skilled therapeutic intervention in order to improve the following deficits and impairments:  Postural dysfunction, Improper body mechanics, Pain, Increased fascial restricitons, Increased muscle spasms, Decreased range of motion, Decreased mobility, Decreased endurance, Decreased activity tolerance  Visit Diagnosis: Cervical radiculopathy  Other symptoms and signs involving the musculoskeletal system  Abnormal posture  Myalgia     Problem List Patient Active Problem List   Diagnosis Date Noted  . Cervical radiculitis 10/30/2015  . Ovarian mass 01/05/2015  . Fibroid 01/05/2015  . Right foot pain 11/17/2014  . Hyperlipidemia 07/26/2014  . Preventive measure 07/06/2014  . 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  . Essential hypertension 04/24/2014  . Chronic headaches 04/24/2014  . Generalized anxiety disorder 04/24/2014  . Chronic neck pain 04/24/2014  . Chronic cervical pain 01/20/2014  . Headache, migraine 07/17/2012  . Aneurysm ([place]) 05/19/2011    Nour Rodrigues Nilda Simmer PT, MPH  11/22/2015, 4:12 PM  [facility] [place], [place], [address] Phone: [phone]   Fax:  [phone]  Name: Emily Ponce MRN: [id number] Date of Birth:  [birth year]

## 2015-11-26 NOTE — Telephone Encounter (Signed)
Left detailed message on patient vm with message as noted below. Rhonda Cunningham,CMA

## 2015-11-26 NOTE — Telephone Encounter (Signed)
[place], Will you please let patient know that she has been referred to Dr. Mayra Reel office for further management of her chronic pain.

## 2015-11-28 ENCOUNTER — Encounter: Payer: Self-pay | Admitting: Family Medicine

## 2015-11-28 DIAGNOSIS — G8929 Other chronic pain: Secondary | ICD-10-CM

## 2015-11-28 DIAGNOSIS — M542 Cervicalgia: Principal | ICD-10-CM

## 2015-12-04 MED ORDER — HYDROCODONE-ACETAMINOPHEN 5-325 MG PO TABS: 1.0000 | ORAL_TABLET | Freq: Four times a day (QID) | ORAL | Status: DC | PRN

## 2015-12-12 ENCOUNTER — Ambulatory Visit (INDEPENDENT_AMBULATORY_CARE_PROVIDER_SITE_OTHER): Payer: BLUE CROSS/BLUE SHIELD | Admitting: Physical Therapy

## 2015-12-12 DIAGNOSIS — R293 Abnormal posture: Secondary | ICD-10-CM | POA: Diagnosis not present

## 2015-12-12 DIAGNOSIS — R29898 Other symptoms and signs involving the musculoskeletal system: Secondary | ICD-10-CM

## 2015-12-12 DIAGNOSIS — M791 Myalgia, unspecified site: Secondary | ICD-10-CM

## 2015-12-12 DIAGNOSIS — M5412 Radiculopathy, cervical region: Secondary | ICD-10-CM | POA: Diagnosis not present

## 2015-12-12 NOTE — Patient Instructions (Signed)
Over Head Pull: Narrow Grip     [place] [phone]   On back, knees bent, feet flat, band across thighs, elbows straight but relaxed. Pull hands apart (start). Keeping elbows straight, bring arms up and over head, hands toward floor. Keep pull steady on band. Hold momentarily. Return slowly, keeping pull steady, back to start. Repeat _10__ times. Band color __red____   Side Pull: Double Arm   On back, knees bent, feet flat. Arms perpendicular to body, shoulder level, elbows straight but relaxed. Pull arms out to sides, elbows straight. Resistance band comes across collarbones, hands toward floor. Hold momentarily. Slowly return to starting position. Repeat _10__ times. Band color __red___    Shoulder Rotation: Double Arm   On back, knees bent, feet flat, elbows tucked at sides, bent 90, hands palms up. Pull hands apart and down toward floor, keeping elbows near sides. Hold momentarily. Slowly return to starting position. Repeat __10_ times. Band color ___red___

## 2015-12-12 NOTE — Therapy (Addendum)
[place], [place], [address] Phone: [phone]   Fax:  [phone]  Physical Therapy Treatment  Patient Details  Name: Emily Ponce MRN: [id number] Date of Birth: 04-Oct-1965 Referring Provider: Dr. Georgina Snell  Encounter Date: 12/12/2015      PT End of Session - 12/12/15 1601    Visit Number 4   Number of Visits 12   Date for PT Re-Evaluation 12/20/15   PT Start Time 1601   PT Stop Time 1649   PT Time Calculation (min) 48 min   Activity Tolerance Patient tolerated treatment well      Past Medical History  Diagnosis Date  . Hypertension   . Ovarian cyst   . Broken neck ([place])     c-7    Past Surgical History  Procedure Laterality Date  . Carpal tunnel release Left   . Esure       There were no vitals filed for this visit.      Subjective Assessment - 12/12/15 1603    Subjective Pt reports her pain has decreased, but she is just returning from vacation.  She has been completing HEP 3-4x/wk.    Patient Stated Goals get shoulder pain gone - improve neck pain    Currently in Pain? Yes   Pain Score 4    Pain Location Shoulder   Pain Orientation Right   Pain Descriptors / Indicators Burning;Throbbing   Aggravating Factors  sitting and typing    Pain Relieving Factors medication, heat            [facility] PT Assessment - 12/12/15 0001    Assessment   Medical Diagnosis Cervical radiculopathy    Referring Provider Dr. Georgina Snell   Onset Date/Surgical Date 10/25/15   Hand Dominance Right   Next MD Visit no return scheduled    Prior Therapy PT for neck after cervical fx for ~1 yr or longer    AROM   Overall AROM Comments Pt in standing, except with rotation    Right Shoulder Extension 35 Degrees   Right Shoulder Flexion 142 Degrees   Right Shoulder ABduction 110 Degrees  with pain and tingling.    Right Shoulder Internal Rotation 32 Degrees   Right Shoulder External Rotation 90  Degrees         [facility] Adult PT Treatment/Exercise - 12/12/15 0001    Shoulder Exercises: Supine   Horizontal ABduction Strengthening;Both;10 reps;Theraband  2 sets   Theraband Level (Shoulder Horizontal ABduction) Level 2 (Red)   External Rotation Strengthening;Both;10 reps;Theraband  2 sets   Theraband Level (Shoulder External Rotation) Level 2 (Red)   Flexion Strengthening;Both;10 reps;Theraband   Theraband Level (Shoulder Flexion) Level 2 (Red)   Other Supine Exercises Hooklying on 1/2 foam roller: 3 position of arm (like doorway, then snow angels x 5 reps (began having tingling in Rt UE -stopped.    Shoulder Exercises: ROM/Strengthening   UBE (Upper Arm Bike) L1: 1.5 min forward, 1 min backward (stopped due to shoulder irritation)   Shoulder Exercises: Stretch   Other Shoulder Stretches 3 way doorway stretch 2 positions 30 sec hold   stopped early due to shoulder irritation, switched to supine   Electrical Stimulation   Electrical Stimulation Location Rt posterior shoulder   Electrical Stimulation Action combo [place]   Electrical Stimulation Parameters to tolerance    Electrical Stimulation Goals Pain;Tone   Ultrasound   Ultrasound Location Rt infraspinatus and rhomboid   Ultrasound Parameters 1.0 w/cm2, 1.0  mHz, 8 min    Ultrasound Goals Pain  tightness   Manual Therapy   Manual Therapy Taping   Manual therapy comments Rock tape applied to Rt posterior shoulder periscapular musculature to decrease pain, increase proprioception.                       PT Long Term Goals - 11/15/15 1808    PT LONG TERM GOAL #1   Title Improve posture and alignment with patient to demonstrate upright posture with improved position of scapulae on thoracic wall 12/20/15   Time 6   Period Weeks   Status On-going   PT LONG TERM GOAL #2   Title Increase Rt shoudler ROM to equal or greater than AROM Lt shoulder 12/20/15   Time 6   Period Weeks   Status On-going   PT LONG TERM GOAL #3    Title Improve tissue extensibility through Rt upper quarter to palpatioin and per pt report thus improving mobility and function 12/20/15   Time 6   Period Weeks   Status On-going   PT LONG TERM GOAL #4   Title Independent in HEP 12/20/15   Time 6   Period Weeks   Status On-going   PT LONG TERM GOAL #5   Title Improve FOTO to </+ 32% limitation 12/20/15   Time 6   Period Weeks   Status On-going               Plan - 12/12/15 1652    Clinical Impression Statement Pt demonstrated improved Rt shoulder ROM this visit; continues to be limited compared to Lt. She tolerated all exercises without increased pain or symptoms with the exception of supine and standing abduction; pt had radicular symtpms and increased pain.  Pt reported decreased symptoms with use of combo estim at end of session.  Progressing gradually towards goals.   Rehab Potential Good   PT Frequency 2x / week   PT Duration 6 weeks   PT Treatment/Interventions Patient/family education;ADLs/Self Care Home Management;Neuromuscular re-education;Manual techniques;Dry needling;Cryotherapy;Electrical Stimulation;Iontophoresis 20m/ml Dexamethasone;Moist Heat;Ultrasound;Traction;Therapeutic activities;Therapeutic exercise   PT Next Visit Plan Assess response to new exercises, Combo [place] tape.     Consulted and Agree with Plan of Care Patient      Patient will benefit from skilled therapeutic intervention in order to improve the following deficits and impairments:  Postural dysfunction, Improper body mechanics, Pain, Increased fascial restricitons, Increased muscle spasms, Decreased range of motion, Decreased mobility, Decreased endurance, Decreased activity tolerance  Visit Diagnosis: Cervical radiculopathy  Other symptoms and signs involving the musculoskeletal system  Abnormal posture  Myalgia  Weakness of shoulder     Problem List Patient Active Problem List   Diagnosis Date Noted  . Cervical radiculitis  10/30/2015  . Ovarian mass 01/05/2015  . Fibroid 01/05/2015  . Right foot pain 11/17/2014  . Hyperlipidemia 07/26/2014  . Preventive measure 07/06/2014  . 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  . Essential hypertension 04/24/2014  . Chronic headaches 04/24/2014  . Generalized anxiety disorder 04/24/2014  . Chronic neck pain 04/24/2014  . Chronic cervical pain 01/20/2014  . Headache, migraine 07/17/2012  . Aneurysm ([place] 05/19/2011   JKerin Perna PTA 12/12/2015 [facility] [address] [address]Phone: [phone]  Fax:  [phone] Name: LTomekia HeltonMRN: [id number] of Birth: 501-19-1967   PHYSICAL THERAPY DISCHARGE SUMMARY  Visits from Start of Care: 4  Current  functional level related to goals / functional outcomes: Decreased pain and improved mobility and functional activity   Remaining deficits: Continued tightness and limited mobility/ROM    Education / Equipment: HEP Plan: Patient agrees to discharge.  Patient goals were partially met. Patient is being discharged due to being pleased with the current functional level.  ?????    Celyn P. Helene Kelp PT, MPH 01/09/2016 12:40 PM

## 2015-12-13 ENCOUNTER — Other Ambulatory Visit: Payer: Self-pay | Admitting: Family Medicine

## 2015-12-14 ENCOUNTER — Other Ambulatory Visit: Payer: Self-pay | Admitting: Family Medicine

## 2015-12-19 ENCOUNTER — Encounter: Payer: BLUE CROSS/BLUE SHIELD | Admitting: Physical Therapy

## 2016-03-02 ENCOUNTER — Other Ambulatory Visit: Payer: Self-pay | Admitting: Sports Medicine

## 2016-03-03 NOTE — Telephone Encounter (Signed)
Rx faxed

## 2016-03-03 NOTE — Telephone Encounter (Signed)
[place], Rx placed in in-box ready for pickup/faxing.  

## 2016-04-23 ENCOUNTER — Encounter: Payer: Self-pay | Admitting: *Deleted

## 2016-04-23 ENCOUNTER — Emergency Department
Admission: EM | Admit: 2016-04-23 | Discharge: 2016-04-23 | Disposition: A | Discharge status: Home / Self Care | Payer: BLUE CROSS/BLUE SHIELD | Source: Home / Self Care | Attending: Emergency Medicine | Admitting: Emergency Medicine

## 2016-04-23 ENCOUNTER — Emergency Department (INDEPENDENT_AMBULATORY_CARE_PROVIDER_SITE_OTHER): Payer: BLUE CROSS/BLUE SHIELD

## 2016-04-23 DIAGNOSIS — M25532 Pain in left wrist: Secondary | ICD-10-CM

## 2016-04-23 DIAGNOSIS — S63511A Sprain of carpal joint of right wrist, initial encounter: Secondary | ICD-10-CM

## 2016-04-23 DIAGNOSIS — M25542 Pain in joints of left hand: Secondary | ICD-10-CM | POA: Diagnosis not present

## 2016-04-23 DIAGNOSIS — S6391XA Sprain of unspecified part of right wrist and hand, initial encounter: Secondary | ICD-10-CM

## 2016-04-23 MED ORDER — HYDROCODONE-ACETAMINOPHEN 5-325 MG PO TABS: 1.0000 | ORAL_TABLET | Freq: Four times a day (QID) | ORAL | 0 refills | Status: DC | PRN

## 2016-04-23 NOTE — ED Provider Notes (Signed)
[facility]    CSN: [id number] Arrival date & time: 04/23/16  [id number]     History   Chief Complaint Chief Complaint  Patient presents with  . Hand Pain    HPI Emily Ponce is a 50 y.o. female.   The history is provided by the patient.  Hand Pain  This is a new problem. The problem occurs constantly. The problem has been gradually worsening. Pertinent negatives include no chest pain, no abdominal pain, no headaches and no shortness of breath. The symptoms are aggravated by bending and twisting. The symptoms are relieved by rest.   Left lateral wrist and hand pain that stabbing, sharp, without radiation. It awoke her at 3 AM last night because of severe pain. Currently 5 out of 10 at rest, 8 out of 10 with movement. She feels it may have been started from doing intensive training of her dog yesterday, requiring lifting of the dog and heavy gripping. She does not recall anyone specific event. Does not recall falling on wrist or hand. Associated symptoms: See above. Occasionally left hand feels numb, not currently. No chest pain or shortness of breath or nausea or vomiting or abdominal pain or rash.  Past surgical history: Notable for prior carpal tunnel surgery left wrist in the past. She took ibuprofen 6:30 AM, that helped minimally Past Medical History:  Diagnosis Date  . Broken neck ([place])    c-7  . Hypertension   . Ovarian cyst     Patient Active Problem List   Diagnosis Date Noted  . Cervical radiculitis 10/30/2015  . Ovarian mass 01/05/2015  . Fibroid 01/05/2015  . Right foot pain 11/17/2014  . Hyperlipidemia 07/26/2014  . Preventive measure 07/06/2014  . 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  . Essential hypertension 04/24/2014  . Chronic headaches 04/24/2014  . Generalized anxiety disorder 04/24/2014  . Chronic neck pain 04/24/2014  . Chronic cervical pain 01/20/2014  . Headache, migraine 07/17/2012  . Aneurysm ([place]) 05/19/2011    Past Surgical History:   Procedure Laterality Date  . CARPAL TUNNEL RELEASE Left   . esure       OB History    Gravida Para Term Preterm AB Living   3 2     1 2    SAB TAB Ectopic Multiple Live Births   1               Home Medications    Prior to Admission medications   Medication Sig Start Date End Date Taking? Authorizing Provider  ALPRAZolam Duanne Moron) 0.5 MG tablet TAKE 1/2-1 TABLET BY MOUTH AT BEDTIME 03/03/16  Yes Sean Hommel, DO  atenolol (TENORMIN) 50 MG tablet Take 1 tablet (50 mg total) by mouth daily. 07/27/15  Yes Sean Hommel, DO  hydrochlorothiazide (HYDRODIURIL) 25 MG tablet Take 1 tablet (25 mg total) by mouth daily. 07/27/15  Yes Sean Hommel, DO  B Complex Vitamins (VITAMIN-B COMPLEX) TABS Take 1 tablet by mouth.    Historical Provider, MD  baclofen (LIORESAL) 10 MG tablet Take 1 tablet (10 mg total) by mouth 3 (three) times daily. 10/30/15   Gregor Hams, MD  Flaxseed, Linseed, (FLAX SEEDS) POWD Take 1 tablet by mouth.    Historical Provider, MD  HYDROcodone-acetaminophen (NORCO/VICODIN) 5-325 MG tablet Take 1-2 tablets by mouth every 6 (six) hours as needed for severe pain. Take with food.-Caution: May cause drowsiness 04/23/16   Jacqulyn Cane, MD  methocarbamol (ROBAXIN) 750 MG tablet Take 1 tablet (750 mg total) by mouth every 8 (  eight) hours as needed (back pain). 10/24/15   Sean Hommel, DO  Omega-3 1000 MG CAPS Take 1 g by mouth.    Historical Provider, MD  ondansetron (ZOFRAN-ODT) 8 MG disintegrating tablet Take 1 tablet (8 mg total) by mouth every 8 (eight) hours as needed for nausea. 07/05/15   Emeterio Reeve, DO    Family History Family History  Problem Relation Age of Onset  . Heart attack Mother   . Hypertension Mother   . Alzheimer's disease Father   . Hypertension Father   . Cancer Sister     cervical    Social History Social History  Substance Use Topics  . Smoking status: Former Smoker    Quit date: 04/24/2010  . Smokeless tobacco: Never Used  . Alcohol use Yes      Comment: occasional     Allergies   Lyrica [pregabalin]   Review of Systems Review of Systems  Respiratory: Negative for shortness of breath.   Cardiovascular: Negative for chest pain.  Gastrointestinal: Negative for abdominal pain.  Neurological: Negative for headaches.  All other systems reviewed and are negative.    Physical Exam Triage Vital Signs ED Triage Vitals  Enc Vitals Group     BP 04/23/16 0840 128/76     Pulse Rate 04/23/16 0840 68     Resp 04/23/16 0840 16     Temp 04/23/16 0840 98 F (36.7 C)     Temp Source 04/23/16 0840 Oral     SpO2 04/23/16 0840 100 %     Weight 04/23/16 0841 165 lb (74.8 kg)     Height 04/23/16 0841 5\' 5"  (1.651 m)     Head Circumference --      Peak Flow --      Pain Score 04/23/16 0843 6     Pain Loc --      Pain Edu? --      Excl. in [place]? --    No data found.   Updated Vital Signs BP 128/76 (BP Location: Left Arm)   Pulse 68   Temp 98 F (36.7 C) (Oral)   Resp 16   Ht 5\' 5"  (1.651 m)   Wt 165 lb (74.8 kg)   SpO2 100%   BMI 27.46 kg/m   Visual Acuity Right Eye Distance:   Left Eye Distance:   Bilateral Distance:    Right Eye Near:   Left Eye Near:    Bilateral Near:     Physical Exam  Constitutional: She is oriented to person, place, and time. She appears well-developed and well-nourished. No distress.  Uncomfortable from left hand and wrist pain, she holds her left hand and wrist with her right hand to stabilize it  HENT:  Head: Normocephalic and atraumatic.  Eyes: Pupils are equal, round, and reactive to light. No scleral icterus.  Neck: Normal range of motion. Neck supple.  Cardiovascular: Normal rate and regular rhythm.   Pulmonary/Chest: Effort normal.  Abdominal: She exhibits no distension.  Musculoskeletal:       Right wrist: She exhibits decreased range of motion, bony tenderness and swelling. She exhibits no crepitus, no deformity and no laceration.       Right hand: She exhibits decreased range  of motion, tenderness and bony tenderness. She exhibits normal capillary refill, no deformity, no laceration and no swelling. Normal sensation noted. Normal strength noted.       Hands: Tinel's and Phalen's sign negative  Neurological: She is alert and oriented to person, place, and  time.  Skin: Skin is warm and dry.  Psychiatric: She has a normal mood and affect. Her behavior is normal.     UC Treatments / Results  Labs (all labs ordered are listed, but only abnormal results are displayed) Labs Reviewed - No data to display  EKG  EKG Interpretation None       Radiology  X-ray LEFT WRIST - COMPLETE 3+ VIEW   COMPARISON:  Concurrently obtained radiographs of the left hand ; prior radiographs of the left wrist and hand 12/26/2013   FINDINGS: There is no evidence of fracture or dislocation. There is no evidence of arthropathy or other focal bone abnormality. Soft tissues are unremarkable.   IMPRESSION: Negative.     Electronically Signed   By: Jacqulynn Cadet M.D.   On: 04/23/2016 09:31  X-ray: LEFT HAND - COMPLETE 3+ VIEW   COMPARISON:  Concurrently obtained radiographs of the left wrist; prior radiographs of the left hand 12/26/2013   FINDINGS: There is no evidence of fracture or dislocation. There is no evidence of arthropathy or other focal bone abnormality. Soft tissues are unremarkable.   IMPRESSION: Negative.     Electronically Signed   By: Jacqulynn Cadet M.D.   On: 04/23/2016 09:32  Procedures Procedures (including critical care time)  Medications Ordered in UC Medications - No data to display   Initial Impression / Assessment and Plan / UC Course  I have reviewed the triage vital signs and the nursing notes.  Pertinent labs & imaging results that were available during my care of the patient were reviewed by me and considered in my medical decision making (see chart for details).  Clinical Course  Comment By Time  Reviewed the  negative x-rays of left wrist and left hand. Reviewed and discussed with patient. We'll treat conservatively with Ace bandage applied left hand and Velcro wrist splint applied left wrist. At her request, small prescription for Vicodin when necessary pain at night Note for no using left hand or wrist for 5 days. Follow-up with orthopedist or sports medicine if no better 5 days. She voiced understanding and agreement Jacqulyn Cane, MD 10/18 1007   Tylenol or ibuprofen for mild to moderate pain  Final Clinical Impressions(s) / UC Diagnoses   Final diagnoses:  Sprain of right hand, initial encounter  Sprain of carpal joint of right wrist, initial encounter    New Prescriptions Discharge Medication List as of 04/23/2016 10:18 AM     HYDROcodone-acetaminophen (NORCO/VICODIN) 5-325 MG tablet Take 1-2 tablets by mouth every 6 (six) hours as needed for severe pain. Take with food.-Caution: May cause drowsiness 12 tablet      Jacqulyn Cane, MD 04/28/16 [phone]

## 2016-04-23 NOTE — ED Triage Notes (Signed)
Pt reports being woke up in the middle of the night with severe LT hand and wrist pain. Denies injury. Reports a history carpal tunnel surgery. She took IBF at 0630.

## 2016-04-29 DIAGNOSIS — G5602 Carpal tunnel syndrome, left upper limb: Secondary | ICD-10-CM | POA: Diagnosis not present

## 2016-05-08 DIAGNOSIS — Z1231 Encounter for screening mammogram for malignant neoplasm of breast: Secondary | ICD-10-CM | POA: Diagnosis not present

## 2016-05-08 LAB — HM MAMMOGRAPHY

## 2016-05-13 ENCOUNTER — Encounter: Payer: Self-pay | Admitting: Obstetrics & Gynecology

## 2016-05-13 ENCOUNTER — Ambulatory Visit (INDEPENDENT_AMBULATORY_CARE_PROVIDER_SITE_OTHER): Payer: BLUE CROSS/BLUE SHIELD | Admitting: Obstetrics & Gynecology

## 2016-05-13 VITALS — BP 117/65 | HR 69 | Resp 16 | Ht 65.0 in | Wt 166.0 lb

## 2016-05-13 DIAGNOSIS — N951 Menopausal and female climacteric states: Secondary | ICD-10-CM

## 2016-05-13 MED ORDER — LIDOCAINE HCL 2 % EX GEL: 1.0000 "application " | CUTANEOUS | 2 refills | Status: DC | PRN

## 2016-05-13 MED ORDER — ESTRADIOL-LEVONORGESTREL 0.045-0.015 MG/DAY TD PTWK: 1.0000 | MEDICATED_PATCH | TRANSDERMAL | 12 refills | Status: DC

## 2016-05-13 NOTE — Progress Notes (Signed)
   Subjective:    Patient ID: Emily Ponce, female    DOB: 09-29-1965, 50 y.o.   MRN: [id number]  HPI  50 yo MW P2 (1 and 41 yo kids) here to discuss menopause symptoms which are driving her crazy. Hot flashes, night sweats, insomnia, decreased libido, moodiness, joint pain, no period for 5-6 months  Review of Systems Declines flu vaccine No FH of breast cancer Mammogram UTD Works for [employer] in [employer] office    Objective:   Physical Exam [id number] Breathing, conversing and ambulating normally     Assessment & Plan:  Menopausal symptoms- discussed treatment options including SSRI versus HRT (with slightly increased risk of breast cancer) She would like HRT- trial of Cl[place]ara Pro RTC prn

## 2016-05-17 DIAGNOSIS — G5602 Carpal tunnel syndrome, left upper limb: Secondary | ICD-10-CM | POA: Diagnosis not present

## 2016-05-19 ENCOUNTER — Ambulatory Visit: Payer: BLUE CROSS/BLUE SHIELD | Admitting: Obstetrics & Gynecology

## 2016-05-19 ENCOUNTER — Encounter: Payer: Self-pay | Admitting: Family Medicine

## 2016-05-22 ENCOUNTER — Other Ambulatory Visit: Payer: Self-pay | Admitting: Family Medicine

## 2016-05-23 DIAGNOSIS — M5412 Radiculopathy, cervical region: Secondary | ICD-10-CM | POA: Diagnosis not present

## 2016-05-23 DIAGNOSIS — G5602 Carpal tunnel syndrome, left upper limb: Secondary | ICD-10-CM | POA: Diagnosis not present

## 2016-05-26 ENCOUNTER — Other Ambulatory Visit: Payer: Self-pay

## 2016-05-26 ENCOUNTER — Other Ambulatory Visit: Payer: Self-pay | Admitting: Family Medicine

## 2016-05-26 MED ORDER — ALPRAZOLAM 0.5 MG PO TABS: ORAL_TABLET | ORAL | 0 refills | Status: DC

## 2016-06-04 DIAGNOSIS — M5412 Radiculopathy, cervical region: Secondary | ICD-10-CM | POA: Diagnosis not present

## 2016-06-10 DIAGNOSIS — M5412 Radiculopathy, cervical region: Secondary | ICD-10-CM | POA: Diagnosis not present

## 2016-06-10 DIAGNOSIS — G5602 Carpal tunnel syndrome, left upper limb: Secondary | ICD-10-CM | POA: Diagnosis not present

## 2016-06-24 ENCOUNTER — Other Ambulatory Visit: Payer: Self-pay

## 2016-06-24 DIAGNOSIS — I10 Essential (primary) hypertension: Secondary | ICD-10-CM

## 2016-06-24 MED ORDER — HYDROCHLOROTHIAZIDE 25 MG PO TABS: 25.0000 mg | ORAL_TABLET | Freq: Every day | ORAL | 0 refills | Status: DC

## 2016-06-24 MED ORDER — ATENOLOL 50 MG PO TABS: 50.0000 mg | ORAL_TABLET | Freq: Every day | ORAL | 0 refills | Status: DC

## 2016-06-25 ENCOUNTER — Encounter: Payer: Self-pay | Admitting: Physician Assistant

## 2016-06-25 ENCOUNTER — Ambulatory Visit (INDEPENDENT_AMBULATORY_CARE_PROVIDER_SITE_OTHER): Payer: BLUE CROSS/BLUE SHIELD | Admitting: Physician Assistant

## 2016-06-25 VITALS — BP 118/73 | HR 69 | Wt 171.0 lb

## 2016-06-25 DIAGNOSIS — R079 Chest pain, unspecified: Secondary | ICD-10-CM | POA: Diagnosis not present

## 2016-06-25 DIAGNOSIS — N644 Mastodynia: Secondary | ICD-10-CM

## 2016-06-25 DIAGNOSIS — E782 Mixed hyperlipidemia: Secondary | ICD-10-CM

## 2016-06-25 DIAGNOSIS — I10 Essential (primary) hypertension: Secondary | ICD-10-CM | POA: Diagnosis not present

## 2016-06-25 DIAGNOSIS — Z299 Encounter for prophylactic measures, unspecified: Secondary | ICD-10-CM | POA: Diagnosis not present

## 2016-06-25 DIAGNOSIS — B354 Tinea corporis: Secondary | ICD-10-CM | POA: Diagnosis not present

## 2016-06-25 DIAGNOSIS — N951 Menopausal and female climacteric states: Secondary | ICD-10-CM

## 2016-06-25 MED ORDER — KETOCONAZOLE 2 % EX CREA: TOPICAL_CREAM | Freq: Two times a day (BID) | CUTANEOUS | Status: AC

## 2016-06-25 NOTE — Patient Instructions (Addendum)
Try Tylenol 500mg , Vitamin E and evening Primrose Oil for the next 6 weeks for breast pain Fasting labs and chest x-ray have been ordered Nothing to eat or drink except water after midnight or at least 8 hours prior to blood draw   Breast Tenderness Breast tenderness is a common problem for women of all ages. Breast tenderness may cause mild discomfort to severe pain. The pain usually comes and goes in association with your menstrual cycle, but it can be constant. Breast tenderness has many possible causes, including hormone changes and some medicines. Your health care provider may order tests, such as a mammogram or an ultrasound, to check for any unusual findings. Having breast tenderness usually does not mean that you have breast cancer. Follow these instructions at home: Sometimes, reassurance that you do not have breast cancer is all that is needed. In general, follow these home care instructions: Managing pain and discomfort  If directed, apply ice to the area:  Put ice in a plastic bag.  Place a towel between your skin and the bag.  Leave the ice on for 20 minutes, 2-3 times a day.  Make sure you are wearing a supportive bra, especially during exercise. You may also want to wear a supportive bra while sleeping if your breasts are very tender. Medicines  Take over-the-counter and prescription medicines only as told by your health care provider. If the cause of your pain is infection, you may be prescribed an antibiotic medicine.  If you were prescribed an antibiotic, take it as told by your health care provider. Do not stop taking the antibiotic even if you start to feel better. General instructions  Your health care provider may recommend that you reduce the amount of fat in your diet. You can do this by:  Limiting fried foods.  Cooking foods using methods, such as baking, boiling, grilling, and broiling.  Decrease the amount of caffeine in your diet. You can do this by  drinking more water and choosing caffeine-free options.  Keep a log of the days and times when your breasts are most tender.  Ask your health care provider how to do breast exams at home. This will help you notice if you have an unusual growth or lump. Contact a health care provider if:  Any part of your breast is hard, red, and hot to the touch. This may be a sign of infection.  You are not breastfeeding and you have fluid, especially blood or pus, coming out of your nipples.  You have a fever.  You have a new or painful lump in your breast that remains after your menstrual period ends.  Your pain does not improve or it gets worse.  Your pain is interfering with your daily activities. This information is not intended to replace advice given to you by your health care provider. Make sure you discuss any questions you have with your health care provider. Document Released: 06/05/2008 Document Revised: 03/21/2016 Document Reviewed: 03/21/2016 Elsevier Interactive Patient Education  2017 [employer].

## 2016-06-25 NOTE — Progress Notes (Signed)
HPI:                                                                Emily Ponce is a 50 y.o. female with PMHx significant for MCA brain aneurysm, HTN, and HLD who presents to [facility] today to establish care and c/o left breast pain  Mastalgia: Unilateral breast pain, left upper inner quadrant that comes and goes. Onset was approx. 6 months ago. It does not occur daily. Describes the pain as "dull and annoying." No associated symptoms. Aspirin does not help. No nipple discharge. Drinks 1-2 cups of coffee per day. She recently started systemic HRT for vasomotor symptoms and has not noticed any change in her breast pain. Mammogram up to date 05/08/16 and normal. Denies family hx of breast cancer.   HTN: controlled on Atenolol 50mg  and HCTZ 25mg . Checks BP's at home. BP range 116-135/70-78. Denies chest pain, dyspnea, lightheadedness, and edema.   HLD: currently taking Omega-3's. No history of statin therapy. Family hx significant for MI in her mother and HTN in both parents.   Brain Aneurysm: patient has an MCA aneurysm that is followed by Dr. Caryl Comes at [facility]. Last MRI angiogram performed on 04/07/16 showed aneurysm is unchanged. She endorses occasional headaches, which she describes as a localized pain on the top of her head. Denies vision changes, focal weakness, or seizures.  Health Maintenance Health Maintenance  Topic Date Due  . HIV Screening  11/07/1978  . INFLUENZA VACCINE  07/26/2016 (Originally 02/05/2016)  . Hepatitis C Screening  06/25/2017 (Originally 06/18/1966)  . MAMMOGRAM  05/08/2018  . PAP SMEAR  07/26/2018  . COLONOSCOPY  06/22/2024  . TETANUS/TDAP  07/25/2024    GYN/Sexual Health  Menstrual status: perimenopausal  LMP: April 2017  Last pap smear: 07/27/15, normal  History of abnormal pap smears: no  Sexually active: yes, no concerns  Current contraception: HRT for menopause  Mammogram:  05/08/16, normal  Health Habits  Diet: has been eating more calories and sweets  Exercise: has not in a couple of months  ETOH: social, 2 drinks/week  Tobacco: no, former smoker  Drugs: no  Dental Exam: up to date  Eye Exam: up to date  Past Medical History:  Diagnosis Date  . Anxiety   . Broken neck ([place])    c-7  . Hypertension   . Ovarian cyst    Past Surgical History:  Procedure Laterality Date  . CARPAL TUNNEL RELEASE Left   . esure     . TUBAL LIGATION     Social History  Substance Use Topics  . Smoking status: Former Smoker    Quit date: 04/24/2010  . Smokeless tobacco: Never Used  . Alcohol use Yes     Comment: occasional   family history includes Alzheimer's disease in her father; Cancer in her sister; Heart attack in her mother; Hypertension in her father and mother.  Review of Systems  Constitutional: Positive for diaphoresis (hot flashes). Negative for chills, fever, malaise/fatigue and weight loss.  HENT: Negative.   Eyes: Negative.   Respiratory: Negative for shortness of breath.   Cardiovascular: Negative for chest pain, palpitations and leg swelling.  Gastrointestinal: Negative for heartburn.  Genitourinary: Negative.   Musculoskeletal: Positive  for myalgias (left breast).  Skin: Positive for rash (left arm).  Neurological: Positive for headaches. Negative for dizziness, tremors, sensory change, speech change, focal weakness and seizures.  Endo/Heme/Allergies: Negative.   Psychiatric/Behavioral: Negative for depression and substance abuse. The patient has insomnia.        Irritability and mood swings    Medications: Current Outpatient Prescriptions  Medication Sig Dispense Refill  . ALPRAZolam (XANAX) 0.5 MG tablet TAKE 1/2-1 TABLET BY MOUTH AT BEDTIME. NEED FOLLOW UP APPOINTMENT WITH NEW PCP FOR MORE REFILLS 30 tablet 0  . atenolol (TENORMIN) 50 MG tablet Take 1 tablet (50 mg total) by mouth daily. Patient needs an establish care  appointment. 30 tablet 0  . B Complex Vitamins (VITAMIN-B COMPLEX) TABS Take 1 tablet by mouth.    . Black Cohosh 160 MG CAPS Take by mouth.    [facility] N[facility]) 0.045-0.015 MG/DAY Place 1 patch onto the skin once a week. 4 patch 12  . Flaxseed, Linseed, (FLAX SEEDS) POWD Take 1 tablet by mouth.    . hydrochlorothiazide (HYDRODIURIL) 25 MG tablet Take 1 tablet (25 mg total) by mouth daily. Patient needs an establish care appointment. 30 tablet 0  . Nutritional Supplements (ESTROVEN PM PO) Take by mouth.    . Omega-3 1000 MG CAPS Take 1 g by mouth.    . pyridoxine (B-6) 100 MG tablet Take 100 mg by mouth daily.    . Thiamine HCl (VITAMIN B-1) 250 MG tablet Take 250 mg by mouth daily.     Current Facility-Administered Medications  Medication Dose Route Frequency Provider Last Rate Last Dose  . ketoconazole (NIZORAL) 2 % cream   Topical BID Trixie Dredge, PA-C       Allergies  Allergen Reactions  . Lyrica [Pregabalin]        Objective:  BP 118/73   Pulse 69   Wt 171 lb (77.6 kg)   BMI 28.46 kg/m  Physical Exam  Constitutional: She is oriented to person, place, and time and well-developed, well-nourished, and in no distress. She appears healthy.  HENT:  Head: Normocephalic and atraumatic.  Right Ear: Tympanic membrane, external ear and ear canal normal.  Left Ear: Tympanic membrane, external ear and ear canal normal.  Nose: Nose normal.  Mouth/Throat: Uvula is midline, oropharynx is clear and moist and mucous membranes are normal.  Eyes: Conjunctivae, EOM and lids are normal.  Neck: Phonation normal. Neck supple. No tracheal tenderness present. No thyromegaly present.  Cardiovascular: Normal rate, regular rhythm, S1 normal and S2 normal.   No murmur heard. Pulses:      Radial pulses are 2+ on the right side, and 2+ on the left side.  Pulmonary/Chest: Effort normal and breath sounds normal. She exhibits no mass, no tenderness, no deformity and  no swelling. Left breast exhibits no nipple discharge, no skin change and no tenderness.  Abdominal: Soft. Normal appearance.  Lymphadenopathy:    She has no axillary adenopathy.  Neurological: She is alert and oriented to person, place, and time. She has normal sensation, normal reflexes and intact cranial nerves. Gait normal.  Skin: Skin is warm and dry. Rash noted.     Psychiatric: Mood, affect and judgment normal.  Vitals reviewed.   No results found for this or any previous visit (from the past 72 hour(s)). No results found.  I personally reviewed the ECG performed today showing normal sinus rhythm  Assessment and Plan: 50 y.o. female with  Essential hypertension - cont daily meds and  home BP monitoring  Mixed Hyperlipidemia - fasting lipids   Encounter for preventive measure - CBC - Comprehensive metabolic panel - Hemoglobin A1c - TSH - VITAMIN D 25 Hydroxy (Vit-D Deficiency, Fractures)  3. Mastalgia - normal mammogram and breast exam today. This is likely hormonal or costochondritis. We discussed a 6 week trial of Vitamin E and evening primrose oil supplements - Instructed to take Tylenol 500mg  prn for pain. Discussed avoiding NSAIDs due to aneurysm - Patient will contact either me or GYN if no improvement of symptoms in the next 4-6 weeks - ECG performed today showed NSR - DG Chest 2 View to rule out lung or heart pathology  4. Tinea corporis, left arm - ketoconazole (NIZORAL) 2 % cream; Apply topically 2 (two) times daily for 2 weeks  Patient education and anticipatory guidance given Patient agrees with treatment plan Follow-up in 6 months or sooner as needed  Darlyne Russian PA-C

## 2016-06-27 ENCOUNTER — Ambulatory Visit (INDEPENDENT_AMBULATORY_CARE_PROVIDER_SITE_OTHER): Payer: BLUE CROSS/BLUE SHIELD

## 2016-06-27 DIAGNOSIS — R0789 Other chest pain: Secondary | ICD-10-CM | POA: Diagnosis not present

## 2016-06-27 DIAGNOSIS — R079 Chest pain, unspecified: Secondary | ICD-10-CM | POA: Diagnosis not present

## 2016-06-27 LAB — LIPID PANEL
CHOL/HDL RATIO: 3.7 ratio (ref <5.0)
Cholesterol: 180 mg/dL (ref <200)
HDL: 49 mg/dL — AB (ref >50)
LDL Cholesterol: 108 mg/dL — ABNORMAL HIGH (ref <100)
Triglycerides: 113 mg/dL (ref <150)
VLDL: 23 mg/dL (ref <30)

## 2016-06-27 LAB — CBC
HCT: 41.8 % (ref 35.0–45.0)
Hemoglobin: 13.9 g/dL (ref 11.7–15.5)
MCH: 31.7 pg (ref 27.0–33.0)
MCHC: 33.3 g/dL (ref 32.0–36.0)
MCV: 95.4 fL (ref 80.0–100.0)
MPV: 11.2 fL (ref 7.5–12.5)
PLATELETS: 246 10*3/uL (ref 140–400)
RBC: 4.38 MIL/uL (ref 3.80–5.10)
RDW: 13.5 % (ref 11.0–15.0)
WBC: 10.3 10*3/uL (ref 3.8–10.8)

## 2016-06-27 LAB — COMPREHENSIVE METABOLIC PANEL
ALK PHOS: 65 U/L (ref 33–130)
ALT: 39 U/L — AB (ref 6–29)
AST: 28 U/L (ref 10–35)
Albumin: 4.2 g/dL (ref 3.6–5.1)
BILIRUBIN TOTAL: 0.7 mg/dL (ref 0.2–1.2)
BUN: 16 mg/dL (ref 7–25)
CO2: 29 mmol/L (ref 20–31)
Calcium: 8.9 mg/dL (ref 8.6–10.4)
Chloride: 103 mmol/L (ref 98–110)
Creat: 0.77 mg/dL (ref 0.50–1.05)
Glucose, Bld: 84 mg/dL (ref 65–99)
POTASSIUM: 3.6 mmol/L (ref 3.5–5.3)
Sodium: 139 mmol/L (ref 135–146)
Total Protein: 6.7 g/dL (ref 6.1–8.1)

## 2016-06-27 LAB — TSH: TSH: 1.4 mIU/L

## 2016-06-27 LAB — HEMOGLOBIN A1C
Hgb A1c MFr Bld: 4.9 % (ref <5.7)
Mean Plasma Glucose: 94 mg/dL

## 2016-06-28 ENCOUNTER — Emergency Department
Admission: EM | Admit: 2016-06-28 | Discharge: 2016-06-28 | Disposition: A | Discharge status: Home / Self Care | Payer: BLUE CROSS/BLUE SHIELD | Source: Home / Self Care | Attending: Family Medicine | Admitting: Family Medicine

## 2016-06-28 ENCOUNTER — Encounter: Payer: Self-pay | Admitting: Emergency Medicine

## 2016-06-28 ENCOUNTER — Emergency Department (INDEPENDENT_AMBULATORY_CARE_PROVIDER_SITE_OTHER): Payer: BLUE CROSS/BLUE SHIELD

## 2016-06-28 DIAGNOSIS — S63641A Sprain of metacarpophalangeal joint of right thumb, initial encounter: Secondary | ICD-10-CM

## 2016-06-28 DIAGNOSIS — M79644 Pain in right finger(s): Secondary | ICD-10-CM | POA: Diagnosis not present

## 2016-06-28 DIAGNOSIS — S6991XA Unspecified injury of right wrist, hand and finger(s), initial encounter: Secondary | ICD-10-CM | POA: Diagnosis not present

## 2016-06-28 LAB — VITAMIN D 25 HYDROXY (VIT D DEFICIENCY, FRACTURES): VIT D 25 HYDROXY: 58 ng/mL (ref 30–100)

## 2016-06-28 MED ORDER — CEPHALEXIN 500 MG PO CAPS: 500.0000 mg | ORAL_CAPSULE | Freq: Three times a day (TID) | ORAL | 0 refills | Status: DC

## 2016-06-28 MED ORDER — HYDROCODONE-ACETAMINOPHEN 5-325 MG PO TABS: ORAL_TABLET | ORAL | 0 refills | Status: DC

## 2016-06-28 NOTE — Discharge Instructions (Signed)
Apply ice pack for 20 to 30 minutes, 3 to 4 times daily  Continue until pain and swelling decrease.  May take Ibuprofen 200mg , 4 tabs every 8 hours with food.  Wear splint.  Begin range of motion and stretching exercises as tolerated.

## 2016-06-28 NOTE — ED Triage Notes (Signed)
Pt c/o right thumb pain after playing with her dog. States dog jumped and pushed her thumb backwards. She has decreased ROM and is taking advil/

## 2016-06-28 NOTE — ED Provider Notes (Signed)
[facility]    CSN: [id number] Arrival date & time: 06/28/16  1239     History   Chief Complaint Chief Complaint  Patient presents with  . Finger Injury    HPI Luxie Marabella is a 50 y.o. female.   Patient was playing with her dog when he jumped on her causing her right thumb to be forced laterally.  She has had persistent pain/swelling in the thumb.   The history is provided by the patient.  Hand Injury  Location:  Finger Finger location:  R thumb Injury: yes   Time since incident:  4 hours Mechanism of injury comment:  Hyperextension of thumb Pain details:    Quality:  Aching   Radiates to:  Does not radiate   Severity:  Moderate   Onset quality:  Sudden   Duration:  4 hours   Timing:  Constant   Progression:  Unchanged Prior injury to area:  No Relieved by:  Nothing Worsened by:  Movement Ineffective treatments:  NSAIDs Associated symptoms: decreased range of motion, stiffness and swelling   Associated symptoms: no muscle weakness and no tingling     Past Medical History:  Diagnosis Date  . Anxiety   . Broken neck ([place])    c-7  . Hypertension   . Ovarian cyst     Patient Active Problem List   Diagnosis Date Noted  . Mastalgia 06/25/2016  . Tinea corporis 06/25/2016  . Perimenopausal vasomotor symptoms 06/25/2016  . Cervical radiculitis 10/30/2015  . Ovarian mass 01/05/2015  . Fibroid 01/05/2015  . Right foot pain 11/17/2014  . Hyperlipidemia 07/26/2014  . Preventive measure 07/06/2014  . 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  . Essential hypertension 04/24/2014  . Chronic headaches 04/24/2014  . Generalized anxiety disorder 04/24/2014  . Chronic neck pain 04/24/2014  . Chronic cervical pain 01/20/2014  . Headache, migraine 07/17/2012  . Aneurysm ([place]) 05/19/2011    Past Surgical History:  Procedure Laterality Date  . CARPAL TUNNEL RELEASE Left   . esure     . TUBAL LIGATION      OB History    Gravida Para Term Preterm AB  Living   3 2     1 2    SAB TAB Ectopic Multiple Live Births   1               Home Medications    Prior to Admission medications   Medication Sig Start Date End Date Taking? Authorizing Provider  ALPRAZolam (XANAX) 0.5 MG tablet TAKE 1/2-1 TABLET BY MOUTH AT BEDTIME. NEED FOLLOW UP APPOINTMENT WITH NEW PCP FOR MORE REFILLS 05/26/16   Hali Marry, MD  atenolol (TENORMIN) 50 MG tablet Take 1 tablet (50 mg total) by mouth daily. Patient needs an establish care appointment. 06/24/16   Jade L Breeback, PA-C  B Complex Vitamins (VITAMIN-B COMPLEX) TABS Take 1 tablet by mouth.    Historical Provider, MD  Black Cohosh 160 MG CAPS Take by mouth.    Historical Provider, MD  estradiol-levonorgestrel [facility]) 0.045-0.015 MG/DAY Place 1 patch onto the skin once a week. 05/13/16   Emily Filbert, MD  Flaxseed, Linseed, (FLAX SEEDS) POWD Take 1 tablet by mouth.    Historical Provider, MD  hydrochlorothiazide (HYDRODIURIL) 25 MG tablet Take 1 tablet (25 mg total) by mouth daily. Patient needs an establish care appointment. 06/24/16   Jade L Breeback, PA-C  Nutritional Supplements (ESTROVEN PM PO) Take by mouth.    Historical Provider, MD  Omega-3 1000  MG CAPS Take 1 g by mouth.    Historical Provider, MD  pyridoxine (B-6) 100 MG tablet Take 100 mg by mouth daily.    Historical Provider, MD  Thiamine HCl (VITAMIN B-1) 250 MG tablet Take 250 mg by mouth daily.    Historical Provider, MD    Family History Family History  Problem Relation Age of Onset  . Heart attack Mother   . Hypertension Mother   . Alzheimer's disease Father   . Hypertension Father   . Cancer Sister     cervical    Social History Social History  Substance Use Topics  . Smoking status: Former Smoker    Quit date: 04/24/2010  . Smokeless tobacco: Never Used  . Alcohol use Yes     Comment: occasional     Allergies   Lyrica [pregabalin]   Review of Systems Review of Systems  Musculoskeletal: Positive for  stiffness.  All other systems reviewed and are negative.    Physical Exam Triage Vital Signs ED Triage Vitals  Enc Vitals Group     BP 06/28/16 1342 134/86     Pulse Rate 06/28/16 1342 66     Resp --      Temp 06/28/16 1342 98.1 F (36.7 C)     Temp Source 06/28/16 1342 Oral     SpO2 06/28/16 1342 99 %     Weight 06/28/16 1344 167 lb (75.8 kg)     Height --      Head Circumference --      Peak Flow --      Pain Score 06/28/16 1345 8     Pain Loc --      Pain Edu? --      Excl. in [place]? --    No data found.   Updated Vital Signs BP 134/86 (BP Location: Right Arm)   Pulse 66   Temp 98.1 F (36.7 C) (Oral)   Wt 167 lb (75.8 kg)   LMP 06/28/2016   SpO2 99%   BMI 27.79 kg/m   Visual Acuity Right Eye Distance:   Left Eye Distance:   Bilateral Distance:    Right Eye Near:   Left Eye Near:    Bilateral Near:     Physical Exam  Constitutional: She appears well-developed and well-nourished. No distress.  HENT:  Head: Normocephalic.  Eyes: Pupils are equal, round, and reactive to light.  Cardiovascular: Normal rate.   Pulmonary/Chest: Effort normal.  Musculoskeletal:       Right hand: She exhibits decreased range of motion, tenderness, bony tenderness and swelling. She exhibits no deformity. Normal sensation noted.       Hands: Right thumb has distinct tenderness to palpation over the ulnar collateral ligament.  MCP joint stable.  Distal neurovascular function is intact.   Neurological: She is alert.  Skin: Skin is warm and dry.  Nursing note and vitals reviewed.    UC Treatments / Results  Labs (all labs ordered are listed, but only abnormal results are displayed) Labs Reviewed - No data to display  EKG  EKG Interpretation None       Radiology Dg Chest 2 View  Result Date: 06/27/2016 CLINICAL DATA:  Left side chest pain for 6 months EXAM: CHEST  2 VIEW COMPARISON:  None. FINDINGS: The heart size and mediastinal contours are within normal limits.  Both lungs are clear. The visualized skeletal structures are unremarkable. IMPRESSION: No active cardiopulmonary disease. Electronically Signed   By: Orlean Bradford.D.  On: 06/27/2016 12:12   Dg Finger Thumb Right  Result Date: 06/28/2016 CLINICAL DATA:  Right thumb injury. Thumb was bent back. Pain at the base of the thumb. EXAM: RIGHT THUMB 2+V COMPARISON:  None. FINDINGS: Negative for fracture or dislocation involving the right thumb. Soft tissues are unremarkable. Normal alignment. IMPRESSION: Negative. Electronically Signed   By: Markus Daft M.D.   On: 06/28/2016 14:02    Procedures Procedures (including critical care time)  Medications Ordered in UC Medications - No data to display   Initial Impression / Assessment and Plan / UC Course  I have reviewed the triage vital signs and the nursing notes.  Pertinent labs & imaging results that were available during my care of the patient were reviewed by me and considered in my medical decision making (see chart for details).  Clinical Course   Dispensed thumb spica splint. Apply ice pack for 20 to 30 minutes, 3 to 4 times daily  Continue until pain and swelling decrease.  May take Ibuprofen 200mg , 4 tabs every 8 hours with food.  Wear splint.  Begin range of motion and stretching exercises as tolerated. Followup with Dr. Aundria Mems or Dr. Lynne Leader ([facility]).    Final Clinical Impressions(s) / UC Diagnoses   Final diagnoses:  Sprain of ulnar collateral ligament of metacarpophalangeal (MCP) joint of right thumb, initial encounter    New Prescriptions Current Discharge Medication List       Kandra Nicolas, MD 07/14/16 1329

## 2016-07-02 ENCOUNTER — Telehealth: Payer: Self-pay | Admitting: Physician Assistant

## 2016-07-02 NOTE — Telephone Encounter (Signed)
I received an outside report from [facility] for an MRI Cspine. There was an incidental finding of mildly enlarged lateral ventricles. Patient is followed by Dr. Caryl Comes in Neurology for an MCA aneurysm. Previous MRI Brain w/o performed on 12/03/14 did not show any ventricular enlargement (see below). I called the patient and discussed this finding with her. She has had no change in her cognition, gait, or urinary continence. She has occasional headaches at baseline, but they have not changed or worsened. I instructed her to contact her Neurologist and see if additional imaging is needed.  IMPRESSION:  Small cavernoma left cerebral peduncle unchanged from 2012. Otherwise normal MRI of the brain with no acute abnormality.  Result Narrative  MRI brain without contrast:  INDICATION:Headache. Right facial numbness.  TECHNIQUE: Imaging of the brain was performed in 3 planes utilizing a combination of T1, FLAIR, and T2 weighting. Diffusion images were performed.   COMPARISON: MRI brain 12/08/2010.  FINDINGS: #Ventricles: Normal without dilatation, mass effect, or midline shift. #Brain parenchyma: On the gradient echo sequence, there is a focus of markedly hypointense signal intensity in the left medial cerebral peduncle. This likely represents a small cavernoma. This was present on the previous MRI of the brain dated 12/08/2010  and is unchanged. There is a single focus of T2 hyperintensity in the subcortical white matter of the deep left frontal lobe just above the insular cortex. This likely represents a focus of chronic ischemic change and is unchanged as well. No additional  area of signal abnormality is noted throughout the brain parenchyma.Diffusion-weighted images are normal indicating no acute infarct.No evidence mass or hemorrhage. #Sella and parasellar region: Normal. #Craniovertebral junction: Normal. 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  Result Narrative  INDICATION: I67.1: Cerebral aneurysm, nonruptured. Follow-up examination.  COMPARISON: 12/03/2014 and 04/06/2014.  TECHNIQUE: MRA circle of Willis performed using time-of-flight technique without contrast. MIP imaging performed.  FINDINGS:  #Distal internal carotid arteries: No aneurysm, significant stenosis or occlusion. #Anterior cerebral arteries: No aneurysm, significant stenosis or occlusion. #Middle cerebral arteries: Again noted is a 2.8 mm aneurysm arising from the RIGHT middle cerebral artery trifurcation. This is unchanged in size and appearance. LEFT middle cerebral artery is unremarkable. #Posterior cerebral arteries: No aneurysm, significant stenosis or occlusion. #Basilar artery: No aneurysm, significant stenosis or occlusion. #Distal vertebral arteries: No aneurysm, significant stenosis or occlusion.  #Additional comments: None.

## 2016-07-04 ENCOUNTER — Encounter: Payer: Self-pay | Admitting: Physician Assistant

## 2016-07-10 ENCOUNTER — Encounter: Payer: Self-pay | Admitting: Obstetrics & Gynecology

## 2016-07-17 ENCOUNTER — Encounter: Payer: Self-pay | Admitting: Obstetrics & Gynecology

## 2016-07-17 ENCOUNTER — Ambulatory Visit (INDEPENDENT_AMBULATORY_CARE_PROVIDER_SITE_OTHER): Payer: BLUE CROSS/BLUE SHIELD | Admitting: Obstetrics & Gynecology

## 2016-07-17 VITALS — BP 121/61 | HR 68 | Resp 16 | Ht 65.0 in | Wt 166.0 lb

## 2016-07-17 DIAGNOSIS — N938 Other specified abnormal uterine and vaginal bleeding: Secondary | ICD-10-CM

## 2016-07-17 DIAGNOSIS — R635 Abnormal weight gain: Secondary | ICD-10-CM

## 2016-07-17 NOTE — Progress Notes (Signed)
   Subjective:    Patient ID: Emily Ponce, female    DOB: [birth year], 51 y.o.   MRN: [id number]  HPI 51 yo MW P2 (39 and 21 kids) here to discuss perimenopausal bleeding. She was started on the climara pro patch about 2 months ago for menopausal symptoms. She had not had any bleeding for about 6 months prior to starting the patch. Since starting the patch she has had 2 occasions of heavy bleeding and stopped the patch about a week ago. She is still bleeding. She is taking a combination of herbs which is helping her hot flashes.  Review of Systems Pap normal 1/17 She had Essure for contraception.    Objective:   Physical Exam [id number] Breathing, conversing, and ambulating normally      Assessment & Plan:  DUB, perimenopausal- check FSH and schedule gyn u/s

## 2016-07-18 LAB — FOLLICLE STIMULATING HORMONE: FSH: 30.3 m[IU]/mL

## 2016-07-21 ENCOUNTER — Other Ambulatory Visit: Payer: Self-pay | Admitting: Family Medicine

## 2016-07-22 ENCOUNTER — Ambulatory Visit (INDEPENDENT_AMBULATORY_CARE_PROVIDER_SITE_OTHER): Payer: BLUE CROSS/BLUE SHIELD

## 2016-07-22 ENCOUNTER — Other Ambulatory Visit: Payer: Self-pay | Admitting: Physician Assistant

## 2016-07-22 DIAGNOSIS — N938 Other specified abnormal uterine and vaginal bleeding: Secondary | ICD-10-CM

## 2016-07-22 DIAGNOSIS — N939 Abnormal uterine and vaginal bleeding, unspecified: Secondary | ICD-10-CM | POA: Diagnosis not present

## 2016-07-22 DIAGNOSIS — I10 Essential (primary) hypertension: Secondary | ICD-10-CM

## 2016-07-24 ENCOUNTER — Other Ambulatory Visit: Payer: Self-pay | Admitting: Family Medicine

## 2016-07-24 ENCOUNTER — Other Ambulatory Visit: Payer: Self-pay | Admitting: Physician Assistant

## 2016-07-25 ENCOUNTER — Other Ambulatory Visit: Payer: Self-pay | Admitting: Physician Assistant

## 2016-07-25 MED ORDER — ALPRAZOLAM 0.5 MG PO TABS: ORAL_TABLET | ORAL | 1 refills | Status: DC

## 2016-07-27 ENCOUNTER — Emergency Department (INDEPENDENT_AMBULATORY_CARE_PROVIDER_SITE_OTHER)
Admission: EM | Admit: 2016-07-27 | Discharge: 2016-07-27 | Disposition: A | Discharge status: Home / Self Care | Payer: BLUE CROSS/BLUE SHIELD | Source: Home / Self Care | Attending: Family Medicine | Admitting: Family Medicine

## 2016-07-27 DIAGNOSIS — R519 Headache, unspecified: Secondary | ICD-10-CM

## 2016-07-27 DIAGNOSIS — J111 Influenza due to unidentified influenza virus with other respiratory manifestations: Secondary | ICD-10-CM

## 2016-07-27 DIAGNOSIS — R51 Headache: Secondary | ICD-10-CM

## 2016-07-27 DIAGNOSIS — R69 Illness, unspecified: Secondary | ICD-10-CM | POA: Diagnosis not present

## 2016-07-27 MED ORDER — OSELTAMIVIR PHOSPHATE 75 MG PO CAPS: 75.0000 mg | ORAL_CAPSULE | Freq: Two times a day (BID) | ORAL | 0 refills | Status: DC

## 2016-07-27 MED ORDER — HYDROCODONE-ACETAMINOPHEN 5-325 MG PO TABS: 1.0000 | ORAL_TABLET | Freq: Four times a day (QID) | ORAL | 0 refills | Status: DC | PRN

## 2016-07-27 NOTE — ED Provider Notes (Signed)
[facility]    CSN: [id number] Arrival date & time: 07/27/16  1253     History   Chief Complaint Chief Complaint  Patient presents with  . Cough  . Headache  . Nasal Congestion    HPI Emily Ponce is a 51 y.o. female.   Complains of 2 day history flu-like illness including myalgias, headache, feeling cold, fatigue, and cough.  Also has mild nasal congestion and sore throat.  Cough is non-productive and somewhat worse at night.  No pleuritic pain or shortness of breath. Her headache has been rather severe and has not responded to Tylenol and Ibuprofen. She requests pain medication for her headache. Her husband has the flu.   The history is provided by the patient.    Past Medical History:  Diagnosis Date  . Anxiety   . Broken neck ([place])    c-7  . Hypertension   . Ovarian cyst     Patient Active Problem List   Diagnosis Date Noted  . Mastalgia 06/25/2016  . Tinea corporis 06/25/2016  . Perimenopausal vasomotor symptoms 06/25/2016  . Cervical radiculitis 10/30/2015  . Ovarian mass 01/05/2015  . Fibroid 01/05/2015  . Right foot pain 11/17/2014  . Hyperlipidemia 07/26/2014  . Preventive measure 07/06/2014  . 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  . Essential hypertension 04/24/2014  . Chronic headaches 04/24/2014  . Generalized anxiety disorder 04/24/2014  . Chronic neck pain 04/24/2014  . Chronic cervical pain 01/20/2014  . Headache, migraine 07/17/2012  . Aneurysm ([place]) 05/19/2011    Past Surgical History:  Procedure Laterality Date  . CARPAL TUNNEL RELEASE Left   . esure     . TUBAL LIGATION      OB History    Gravida Para Term Preterm AB Living   3 2     1 2    SAB TAB Ectopic Multiple Live Births   1               Home Medications    Prior to Admission medications   Medication Sig Start Date End Date Taking? Authorizing Provider  ALPRAZolam Duanne Moron) 0.5 MG tablet TAKE 1/2-1 TABLET BY MOUTH AT BEDTIME AS NEEDED 07/25/16   Trixie Dredge, PA-C  atenolol (TENORMIN) 25 MG tablet Take 2 tablets (50 mg total) by mouth daily. 07/22/16   Trixie Dredge, PA-C  B Complex Vitamins (VITAMIN-B COMPLEX) TABS Take 1 tablet by mouth.    Historical Provider, MD  Black Cohosh 160 MG CAPS Take by mouth.    Historical Provider, MD  estradiol-levonorgestrel [facility]) 0.045-0.015 MG/DAY Place 1 patch onto the skin once a week. Patient not taking: Reported on 07/17/2016 05/13/16   Emily Filbert, MD  Flaxseed, Linseed, (FLAX SEEDS) POWD Take 1 tablet by mouth.    Historical Provider, MD  hydrochlorothiazide (HYDRODIURIL) 25 MG tablet Take 1 tablet (25 mg total) by mouth daily. 07/22/16   Trixie Dredge, PA-C  HYDROcodone-acetaminophen (NORCO/VICODIN) 5-325 MG tablet Take 1 tablet by mouth every 6 (six) hours as needed for moderate pain. Take one by mouth at bedtime as needed for pain 07/27/16   Kandra Nicolas, MD  Omega-3 1000 MG CAPS Take 1 g by mouth.    Historical Provider, MD  oseltamivir (TAMIFLU) 75 MG capsule Take 1 capsule (75 mg total) by mouth every 12 (twelve) hours. 07/27/16   Kandra Nicolas, MD  pyridoxine (B-6) 100 MG tablet Take 100 mg by mouth daily.    Historical Provider, MD  Thiamine  HCl (VITAMIN B-1) 250 MG tablet Take 250 mg by mouth daily.    Historical Provider, MD    Family History Family History  Problem Relation Age of Onset  . Heart attack Mother   . Hypertension Mother   . Alzheimer's disease Father   . Hypertension Father   . Cancer Sister     cervical    Social History Social History  Substance Use Topics  . Smoking status: Former Smoker    Quit date: 04/24/2010  . Smokeless tobacco: Never Used  . Alcohol use Yes     Comment: occasional     Allergies   Lyrica [pregabalin]   Review of Systems Review of Systems + sore throat + cough No pleuritic pain No wheezing + nasal congestion + post-nasal drainage No sinus pain/pressure No itchy/red eyes No  earache No hemoptysis No SOB No fever/chills No nausea No vomiting No abdominal pain No diarrhea No urinary symptoms No skin rash + fatigue No myalgias + headache Used OTC meds without relief   Physical Exam Triage Vital Signs ED Triage Vitals [07/27/16 1310]  Enc Vitals Group     BP 119/75     Pulse Rate 61     Resp 16     Temp 98 F (36.7 C)     Temp Source Oral     SpO2 96 %     Weight      Height      Head Circumference      Peak Flow      Pain Score      Pain Loc      Pain Edu?      Excl. in [place]?    No data found.   Updated Vital Signs BP 119/75 (BP Location: Left Arm)   Pulse 61   Temp 98 F (36.7 C) (Oral)   Resp 16   LMP 06/28/2016   SpO2 96%   Visual Acuity Right Eye Distance:   Left Eye Distance:   Bilateral Distance:    Right Eye Near:   Left Eye Near:    Bilateral Near:     Physical Exam Nursing notes and Vital Signs reviewed. Appearance:  Patient appears stated age, and in no acute distress Eyes:  Pupils are equal, round, and reactive to light and accomodation.  Extraocular movement is intact.  Conjunctivae are not inflamed.  No photophobia.  Ears:  Canals normal.  Tympanic membranes normal.  Nose:  Mildly congested turbinates.  No sinus tenderness.   Pharynx:  Normal Neck:  Supple.  Tender enlarged posterior/lateral nodes are palpated bilaterally  Lungs:  Clear to auscultation.  Breath sounds are equal.  Moving air well. Heart:  Regular rate and rhythm without murmurs, rubs, or gallops.  Abdomen:  Nontender without masses or hepatosplenomegaly.  Bowel sounds are present.  No CVA or flank tenderness.  Extremities:  No edema.  Skin:  No rash present.   Neurologic:  Cranial nerves 2 through 12 are normal.  Patellar, achilles, and elbow reflexes are normal.  Cerebellar function is intact (finger-to-nose and rapid alternating hand movement).  Gait and station are normal.     UC Treatments / Results  Labs (all labs ordered are listed,  but only abnormal results are displayed) Labs Reviewed - No data to display  EKG  EKG Interpretation None       Radiology No results found.  Procedures Procedures (including critical care time)  Medications Ordered in UC Medications - No data to display  Initial Impression / Assessment and Plan / UC Course  I have reviewed the triage vital signs and the nursing notes.  Pertinent labs & imaging results that were available during my care of the patient were reviewed by me and considered in my medical decision making (see chart for details).    Begin Tamiflu.  Rx Lortab for headache. Take plain guaifenesin (1200mg  extended release tabs such as Mucinex) twice daily, with plenty of water, for cough and congestion.  Get adequate rest.   May use Afrin nasal spray (or generic oxymetazoline) twice daily for about 5 days and then discontinue.  Also recommend using saline nasal spray several times daily and saline nasal irrigation (AYR is a common brand).  Use Flonase nasal spray each morning after using Afrin nasal spray and saline nasal irrigation. Try warm salt water gargles for sore throat.  Stop all antihistamines for now, and other non-prescription cough/cold preparations. May take Tylenol as needed for fever, headache, sore throat, etc.  May take Delsym Cough Suppressant at bedtime for nighttime cough.  Followup with Family Doctor if not improved in 5 days.    Final Clinical Impressions(s) / UC Diagnoses   Final diagnoses:  Influenza-like illness  Acute nonintractable headache, unspecified headache type    New Prescriptions New Prescriptions   HYDROCODONE-ACETAMINOPHEN (NORCO/VICODIN) 5-325 MG TABLET    Take 1 tablet by mouth every 6 (six) hours as needed for moderate pain. Take one by mouth at bedtime as needed for pain   OSELTAMIVIR (TAMIFLU) 75 MG CAPSULE    Take 1 capsule (75 mg total) by mouth every 12 (twelve) hours.     Kandra Nicolas, MD 08/02/16 [phone]

## 2016-07-27 NOTE — Discharge Instructions (Signed)
Take plain guaifenesin (1200mg  extended release tabs such as Mucinex) twice daily, with plenty of water, for cough and congestion.  Get adequate rest.   May use Afrin nasal spray (or generic oxymetazoline) twice daily for about 5 days and then discontinue.  Also recommend using saline nasal spray several times daily and saline nasal irrigation (AYR is a common brand).  Use Flonase nasal spray each morning after using Afrin nasal spray and saline nasal irrigation. Try warm salt water gargles for sore throat.  Stop all antihistamines for now, and other non-prescription cough/cold preparations. May take Tylenol as needed for fever, headache, sore throat, etc.  May take Delsym Cough Suppressant at bedtime for nighttime cough.

## 2016-07-27 NOTE — ED Triage Notes (Signed)
Reports cough, congestion, weakness, and headache for past 2 days; no known fever or chills.

## 2016-09-02 DIAGNOSIS — L920 Granuloma annulare: Secondary | ICD-10-CM | POA: Diagnosis not present

## 2016-10-10 ENCOUNTER — Other Ambulatory Visit: Payer: Self-pay | Admitting: Physician Assistant

## 2016-10-16 ENCOUNTER — Other Ambulatory Visit: Payer: Self-pay | Admitting: Physician Assistant

## 2016-10-16 DIAGNOSIS — I10 Essential (primary) hypertension: Secondary | ICD-10-CM

## 2016-10-24 ENCOUNTER — Other Ambulatory Visit: Payer: Self-pay | Admitting: Physician Assistant

## 2016-10-24 DIAGNOSIS — I10 Essential (primary) hypertension: Secondary | ICD-10-CM

## 2016-10-31 ENCOUNTER — Ambulatory Visit (INDEPENDENT_AMBULATORY_CARE_PROVIDER_SITE_OTHER): Payer: BLUE CROSS/BLUE SHIELD | Admitting: Osteopathic Medicine

## 2016-10-31 ENCOUNTER — Encounter: Payer: Self-pay | Admitting: Osteopathic Medicine

## 2016-10-31 VITALS — BP 117/68 | HR 59 | Resp 16 | Wt 163.5 lb

## 2016-10-31 DIAGNOSIS — F411 Generalized anxiety disorder: Secondary | ICD-10-CM

## 2016-10-31 DIAGNOSIS — R0602 Shortness of breath: Secondary | ICD-10-CM | POA: Diagnosis not present

## 2016-10-31 DIAGNOSIS — R0989 Other specified symptoms and signs involving the circulatory and respiratory systems: Secondary | ICD-10-CM

## 2016-10-31 DIAGNOSIS — F458 Other somatoform disorders: Secondary | ICD-10-CM

## 2016-10-31 MED ORDER — ALBUTEROL SULFATE HFA 108 (90 BASE) MCG/ACT IN AERS: 1.0000 | INHALATION_SPRAY | Freq: Four times a day (QID) | RESPIRATORY_TRACT | 1 refills | Status: DC | PRN

## 2016-10-31 NOTE — Patient Instructions (Addendum)
Plan:  Based on your description of symptoms and on my exam in the office, I am not very suspicious of a serious problem. I suspect more an allergy-induced asthma, or possible anxiety reaction.   Let's try an inhaler to use as needed for similar symptoms (note: this may cause some tremors or palpitations but breathing should improve if it's an asthma-type situation). If your symptoms worsen, especially if it involves severe shortness of breath, chest pain, or dizziness - please seek emergency care.   We can also get a CT of the neck if that tightness feeling persists but I am more suspicious of a muscle spasm there due to heavy breathing.   Please follow-up next week with your PCP to recheck your symptoms and discuss further workup if needed.

## 2016-10-31 NOTE — Progress Notes (Signed)
HPI: Emily Ponce is a 51 y.o. female  who presents to [facility] today, 10/31/16,  for chief complaint of:  Chief Complaint  Patient presents with  . Shortness of Breath    Episode occurred yesterday. Was out walking at lunchtime with a friend, came back inside to work and was experiencing some chest tightness symptoms/shortness of breath. No associated chest pain or dizziness. Reports globus sensation as well, particularly notable on the left. She left work early. Went and got some Flonase which seemed to help the symptoms. Similar symptoms in the past while outside, Flonase helped about 0.2. No lower extremity swelling. No chest pain on exertion though patient reports some deconditioning she is able to work out on the treadmill without event. No palpitations. At this point symptoms have totally resolved. No history of asthma though she does have history of seasonal allergies. Patient reports significant increased stress at work and is concerned that this may have something to do with her symptoms.   Past medical history, surgical history, social history and family history reviewed.  Patient Active Problem List   Diagnosis Date Noted  . Mastalgia 06/25/2016  . Tinea corporis 06/25/2016  . Perimenopausal vasomotor symptoms 06/25/2016  . Cervical radiculitis 10/30/2015  . Ovarian mass 01/05/2015  . Fibroid 01/05/2015  . Right foot pain 11/17/2014  . Hyperlipidemia 07/26/2014  . Preventive measure 07/06/2014  . 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  . Essential hypertension 04/24/2014  . Chronic headaches 04/24/2014  . Generalized anxiety disorder 04/24/2014  . Chronic neck pain 04/24/2014  . Chronic cervical pain 01/20/2014  . Headache, migraine 07/17/2012  . Aneurysm ([place]) 05/19/2011    Current medication list and allergy/intolerance information reviewed.   Current Outpatient Prescriptions on File Prior to Visit  Medication Sig Dispense Refill  .  ALPRAZolam (XANAX) 0.5 MG tablet TAKE 1/2 TO 1 TABLET BY MOUTH EVERY NIGHT AT BEDTIME AS NEEDED 30 tablet 0  . atenolol (TENORMIN) 25 MG tablet TAKE 2 TABLETS BY MOUTH DAILY 180 tablet 0  . B Complex Vitamins (VITAMIN-B COMPLEX) TABS Take 1 tablet by mouth.    . Black Cohosh 160 MG CAPS Take by mouth.    [facility]) 0.045-0.015 MG/DAY Place 1 patch onto the skin once a week. (Patient not taking: Reported on 07/17/2016) 4 patch 12  . Flaxseed, Linseed, (FLAX SEEDS) POWD Take 1 tablet by mouth.    . hydrochlorothiazide (HYDRODIURIL) 25 MG tablet TAKE 1 TABLET BY MOUTH EVERY DAY 30 tablet 0  . HYDROcodone-acetaminophen (NORCO/VICODIN) 5-325 MG tablet Take 1 tablet by mouth every 6 (six) hours as needed for moderate pain. Take one by mouth at bedtime as needed for pain 12 tablet 0  . Omega-3 1000 MG CAPS Take 1 g by mouth.    . oseltamivir (TAMIFLU) 75 MG capsule Take 1 capsule (75 mg total) by mouth every 12 (twelve) hours. 10 capsule 0  . pyridoxine (B-6) 100 MG tablet Take 100 mg by mouth daily.    . Thiamine HCl (VITAMIN B-1) 250 MG tablet Take 250 mg by mouth daily.     No current facility-administered medications on file prior to visit.    Allergies  Allergen Reactions  . Lyrica [Pregabalin]       Review of Systems:  Constitutional: No recent illness  HEENT: No  headache, no vision change, + globus sensation as per history of present illness  Cardiac: No chest pain, No  pressure, No palpitations  Respiratory:  +shortness of  breath As noted per history of present illness yesterday which has since resolved. No  Cough  Gastrointestinal: No  abdominal pain, no change on bowel habits  Musculoskeletal: No new myalgia/arthralgia  Skin: No  Rash  Hem/Onc: No  easy bruising/bleeding, No  abnormal lumps/bumps  Neurologic: No  weakness, No  Dizziness  Psychiatric: No  concerns with depression, +concerns with anxiety  Exam:  BP 117/68   Pulse (!) 59    Resp 16   Wt 163 lb 8 oz (74.2 kg)   SpO2 98%   BMI 27.21 kg/m   Constitutional: VS see above. General Appearance: alert, well-developed, well-nourished, NAD  Eyes: Normal lids and conjunctive, non-icteric sclera  Ears, Nose, Mouth, Throat: MMM, Normal external inspection ears/nares/mouth/lips/gums. Tympanic membranes normal bilaterally. Normal nasopharynx.  Neck: No masses, trachea midline. No lymphadenopathy or thyromegaly.  Respiratory: Normal respiratory effort. no wheeze, no rhonchi, no rales  Cardiovascular: S1/S2 normal, no murmur, no rub/gallop auscultated. RRR. Homans sign negative bilaterally, no lower extremity edema.  Musculoskeletal: Gait normal. Symmetric and independent movement of all extremities  Neurological: Normal balance/coordination. No tremor.  Skin: warm, dry, intact.   Psychiatric: Normal judgment/insight. Normal mood and affect. Oriented x3.     ASSESSMENT/PLAN: The primary encounter diagnosis was Shortness of breath. Diagnoses of Globus sensation and Anxiety state were also pertinent to this visit.    Episode of chest tightness feeling and shortness of breath associated with anxiety as well as exposure to outdoor air/allergens. Symptoms resolved after patient used Flonase, I suspect resolved spontaneously with associated use of Flonase - patient is advised that this medicine will not particularly help lower her respiratory symptoms though it may help allergic rhinitis/sinusitis and she is encouraged to continue use of this medication for those issues.  Based on description of symptoms, low suspicion for cardiac issue or pulmonary embolus, patient can tolerate strenuous exercise without much difficulty. Patient would like to trial inhaler. Patient feels fairly confident that this is more of an anxiety related issue I would tend to agree with suspicion of possible allergy/asthma related component.  Advised that if she is requiring frequent use of inhaler,  would return to clinic for PFT. On exam normal today and patient feels well today. Do not think additional labs or chest x-ray is warranted at this time.   Re: globus sensation, patient would like to hold off on imaging for now although would recommend CT neck or ENT referral for scope if persisting.  Patient is agreeable to plan as noted above with trial of inhaler at this point and further workup depending on symptoms    Patient Instructions  Plan:  Based on your description of symptoms and on my exam in the office, I am not very suspicious of a serious problem. I suspect more an allergy-induced asthma, or possible anxiety reaction.   Let's try an inhaler to use as needed for similar symptoms (note: this may cause some tremors or palpitations but breathing should improve if it's an asthma-type situation). If your symptoms worsen, especially if it involves severe shortness of breath, chest pain, or dizziness - please seek emergency care.   We can also get a CT of the neck if that tightness feeling persists but I am more suspicious of a muscle spasm there due to heavy breathing.   Please follow-up next week with your PCP to recheck your symptoms and discuss further workup if needed.     Follow-up plan: Return in about 1 week (around 11/07/2016) for recheck  symptoms with Evlyn Clines .  Visit summary with medication list and pertinent instructions was printed for patient to review, alert [place] if any changes needed. All questions at time of visit were answered - patient instructed to contact office with any additional concerns. ER/RTC precautions were reviewed with the patient and understanding verbalized.

## 2016-11-21 ENCOUNTER — Other Ambulatory Visit: Payer: Self-pay | Admitting: Physician Assistant

## 2016-11-21 DIAGNOSIS — I10 Essential (primary) hypertension: Secondary | ICD-10-CM

## 2016-11-25 ENCOUNTER — Other Ambulatory Visit: Payer: Self-pay | Admitting: Physician Assistant

## 2016-12-05 ENCOUNTER — Ambulatory Visit (INDEPENDENT_AMBULATORY_CARE_PROVIDER_SITE_OTHER): Payer: BLUE CROSS/BLUE SHIELD | Admitting: Physician Assistant

## 2016-12-05 VITALS — BP 133/78 | HR 67 | Wt 157.0 lb

## 2016-12-05 DIAGNOSIS — I10 Essential (primary) hypertension: Secondary | ICD-10-CM

## 2016-12-05 DIAGNOSIS — F99 Mental disorder, not otherwise specified: Secondary | ICD-10-CM

## 2016-12-05 DIAGNOSIS — F5105 Insomnia due to other mental disorder: Secondary | ICD-10-CM | POA: Diagnosis not present

## 2016-12-05 MED ORDER — CALCIUM CARBONATE-VITAMIN D 600-400 MG-UNIT PO TABS: 1.0000 | ORAL_TABLET | Freq: Two times a day (BID) | ORAL | 11 refills | Status: DC

## 2016-12-05 MED ORDER — ALPRAZOLAM 0.5 MG PO TABS: ORAL_TABLET | ORAL | 2 refills | Status: DC

## 2016-12-05 MED ORDER — ATENOLOL 25 MG PO TABS: 50.0000 mg | ORAL_TABLET | Freq: Every day | ORAL | 2 refills | Status: DC

## 2016-12-05 MED ORDER — HYDROCHLOROTHIAZIDE 25 MG PO TABS: 25.0000 mg | ORAL_TABLET | Freq: Every day | ORAL | 2 refills | Status: DC

## 2016-12-05 NOTE — Progress Notes (Signed)
HPI:                                                                Karilynn Carranza is a 51 y.o. female who presents to [facility] today for   HTN: taking Atenolol 50mg  and HCTZ 25mg  daily. Compliant with medications. Patient reports she had a health screening at work and BP was 99/60. Does not check BP's at home. Denies vision change, chest pain with exertion, orthopnea, lightheadedness, syncope and edema.   Anxiety/Insomnia: patient reports she has been taking Xanax 0.5mg  for years. She takes it almost nightly for sleep. She uses it very rarely for anxiety.   Past Medical History:  Diagnosis Date  . Anxiety   . Broken neck ([place])    c-7  . Hypertension   . Ovarian cyst    Past Surgical History:  Procedure Laterality Date  . CARPAL TUNNEL RELEASE Left   . esure     . TUBAL LIGATION     Social History  Substance Use Topics  . Smoking status: Former Smoker    Quit date: 04/24/2010  . Smokeless tobacco: Never Used  . Alcohol use Yes     Comment: occasional   family history includes Alzheimer's disease in her father; Cancer in her sister; Heart attack in her mother; Hypertension in her father and mother.  ROS: negative except as noted in the HPI  Medications: Current Outpatient Prescriptions  Medication Sig Dispense Refill  . ALPRAZolam (XANAX) 0.5 MG tablet TAKE 1/2 TO 1 TABLET BY MOUTH EVERY NIGHT AT BEDTIME AS NEEDED 30 tablet 2  . atenolol (TENORMIN) 25 MG tablet Take 2 tablets (50 mg total) by mouth daily. 180 tablet 2  . Black Cohosh 160 MG CAPS Take by mouth.    . Cyanocobalamin (B-12 COMPLIANCE INJECTION IJ) Inject as directed.    . Flaxseed, Linseed, (FLAX SEEDS) POWD Take 1 tablet by mouth.    . folic acid (FOLVITE) 937 MCG tablet Take 400 mcg by mouth daily.    . hydrochlorothiazide (HYDRODIURIL) 25 MG tablet Take 1 tablet (25 mg total) by mouth daily. 90 tablet 2  . Omega-3 1000 MG CAPS Take 1 g by mouth.     . phentermine (ADIPEX-P) 37.5 MG tablet Take 37.5 mg by mouth daily before breakfast.    . pyridoxine (B-6) 100 MG tablet Take 100 mg by mouth daily.    . ranitidine (ZANTAC) 150 MG tablet Take 150 mg by mouth 2 (two) times daily.    . Thiamine HCl (VITAMIN B-1) 250 MG tablet Take 250 mg by mouth daily.    . Calcium Carbonate-Vitamin D 600-400 MG-UNIT tablet Take 1 tablet by mouth 2 (two) times daily. 60 tablet 11   No current facility-administered medications for this visit.    Allergies  Allergen Reactions  . Lyrica [Pregabalin]        Objective:  BP 133/78   Pulse 67   Wt 157 lb (71.2 kg)   BMI 26.13 kg/m  Gen: well-groomed, cooperative, not ill-appearing, no distress HEENT: normal conjunctiva, TM's clear, oropharynx clear, moist mucus membranes, neck supple, trachea midline Pulm: Normal work of breathing, normal phonation, clear to auscultation bilaterally, no wheezes, rales or rhonchi CV: Normal rate, regular  rhythm, s1 and s2 distinct, no murmurs, clicks or rubs, no carotid bruit Neuro: alert and oriented x 3, EOM's intact, no tremor MSK: moving all extremities, normal gait and station, no peripheral edema Lymph: no cervical or tonsillar adenopathy Psych: good eye contact, euthymic affect, normal speech and thought content  Depression screen [facility] 2/9 12/07/2016  Decreased Interest 0  Down, Depressed, Hopeless 0  PHQ - 2 Score 0    GAD 7 : Generalized Anxiety Score 12/07/2016  Nervous, Anxious, on Edge 0  Control/stop worrying 0  Worry too much - different things 0  Trouble relaxing 0  Restless 0  Easily annoyed or irritable 0  Afraid - awful might happen 0  Total GAD 7 Score 0      No results found for this or any previous visit (from the past 72 hour(s)). No results found.    Assessment and Plan: 51 y.o. female with   1. Essential hypertension - BP in range today - cont daily meds - DASH eating plan, therapeutic lifestyle changes - hydrochlorothiazide  (HYDRODIURIL) 25 MG tablet; Take 1 tablet (25 mg total) by mouth daily.  Dispense: 90 tablet; Refill: 2 - atenolol (TENORMIN) 25 MG tablet; Take 2 tablets (50 mg total) by mouth daily.  Dispense: 180 tablet; Refill: 2  2. Insomnia due to other mental disorder - checked [facility], last fill date 10/10/16 - ALPRAZolam (XANAX) 0.5 MG tablet; TAKE 1/2 TO 1 TABLET BY MOUTH EVERY NIGHT AT BEDTIME AS NEEDED  Dispense: 30 tablet; Refill: 2  Patient education and anticipatory guidance given Patient agrees with treatment plan Follow-up in 6 months ford GAD7 or sooner as needed if symptoms worsen or fail to improve  Darlyne Russian PA-C

## 2016-12-05 NOTE — Patient Instructions (Addendum)
In terms of lowering cholesterol - DASH and Mediterranean diet have the best data  - physical activity and weight loss   Physical Activity Recommendations for modifying lipids and lowering blood pressure Engage in aerobic physical activity to reduce LDL-cholesterol, non-HDL-cholesterol, and blood pressure  Frequency: 3-4 sessions per week  Intensity: moderate to vigorous  Duration: 40 minutes on average  Physical Activity Recommendations for secondary prevention 1. Aerobic exercise  Frequency: 3-5 sessions per week  Intensity: 50-80% capacity  Duration: 20 - 60 minutes  Examples: walking, treadmill, cycling, rowing, stair climbing, and arm/leg ergometry  2. Resistance exercise  Frequency: 2-3 sessions per week  Intensity: 10-15 repetitions/set to moderate fatigue  Duration: 1-3 sets of 8-10 upper and lower body exercises  Examples: calisthenics, elastic bands, cuff/hand weights, dumbbels, free weights, wall pulleys, and weight machines  Heart-Healthy Lifestyle  Eating a diet rich in vegetables, fruits and whole grains: also includes low-fat dairy products, poultry, fish, legumes, and nuts; limit intake of sweets, sugar-sweetened beverages and red meats  Getting regular exercise  Maintaining a healthy weight  Not smoking or getting help quitting  Staying on top of your health; for some people, lifestyle changes alone may not be enough to prevent a heart attack or stroke. In these cases, taking a statin at the right dose will most likely be necessary  Fat and Cholesterol Restricted Diet High levels of fat and cholesterol in your blood may lead to various health problems, such as diseases of the heart, blood vessels, gallbladder, liver, and pancreas. Fats are concentrated sources of energy that come in various forms. Certain types of fat, including saturated fat, may be harmful in excess. Cholesterol is a substance needed by your body in small amounts. Your body makes all  the cholesterol it needs. Excess cholesterol comes from the food you eat. When you have high levels of cholesterol and saturated fat in your blood, health problems can develop because the excess fat and cholesterol will gather along the walls of your blood vessels, causing them to narrow. Choosing the right foods will help you control your intake of fat and cholesterol. This will help keep the levels of these substances in your blood within normal limits and reduce your risk of disease  What types of fat should I choose?  Choose healthy fats more often. Choose monounsaturated and polyunsaturated fats, such as olive and canola oil, flaxseeds, walnuts, almonds, and seeds.  Eat more omega-3 fats. Good choices include salmon, mackerel, sardines, tuna, flaxseed oil, and ground flaxseeds. Aim to eat fish at least two times a week.  Limit saturated fats. Saturated fats are primarily found in animal products, such as meats, butter, and cream. Plant sources of saturated fats include palm oil, palm kernel oil, and coconut oil.  Avoid foods with partially hydrogenated oils in them. These contain trans fats. Examples of foods that contain trans fats are stick margarine, some tub margarines, cookies, crackers, and other baked goods. What general guidelines do I need to follow? These guidelines for healthy eating will help you control your intake of fat and cholesterol:  Check food labels carefully to identify foods with trans fats or high amounts of saturated fat.  Fill one half of your plate with vegetables and green salads.  Fill one fourth of your plate with whole grains. Look for the word "whole" as the first word in the ingredient list.  Fill one fourth of your plate with lean protein foods.  Limit fruit to two servings a day.  Choose fruit instead of juice.  Eat more foods that contain fiber, such as apples, broccoli, carrots, beans, peas, and barley.  Eat more home-cooked food and less  restaurant, buffet, and fast food.  Limit or avoid alcohol.  Limit foods high in starch and sugar.  Limit fried foods.  Cook foods using methods other than frying. Baking, boiling, grilling, and broiling are all great options.  Lose weight if you are overweight. Losing just 5-10% of your initial body weight can help your overall health and prevent diseases such as diabetes and heart disease.  What foods can I eat? Grains  Whole grains, such as whole wheat or whole grain breads, crackers, cereals, and pasta. Unsweetened oatmeal, bulgur, barley, quinoa, or brown rice. Corn or whole wheat flour tortillas. Vegetables  Fresh or frozen vegetables (raw, steamed, roasted, or grilled). Green salads. Fruits  All fresh, canned (in natural juice), or frozen fruits. Meats and other protein foods  Ground beef (85% or leaner), grass-fed beef, or beef trimmed of fat. Skinless chicken or [place]. Ground chicken or [place]. Pork trimmed of fat. All fish and seafood. Eggs. Dried beans, peas, or lentils. Unsalted nuts or seeds. Unsalted canned or dry beans. Dairy  Low-fat dairy products, such as skim or 1% milk, 2% or reduced-fat cheeses, low-fat ricotta or cottage cheese, or plain low-fat yo Fats and oils  Tub margarines without trans fats. Light or reduced-fat mayonnaise and salad dressings. Avocado. Olive, canola, sesame, or safflower oils. Natural peanut or almond butter (choose ones without added sugar and oil). The items listed above may not be a complete list of recommended foods or beverages. Contact your dietitian for more options. Foods to avoid Grains  White bread. White pasta. White rice. Cornbread. Bagels, pastries, and croissants. Crackers that contain trans fat. Vegetables  White potatoes. Corn. Creamed or fried vegetables. Vegetables in a cheese sauce. Fruits  Dried fruits. Canned fruit in light or heavy syrup. Fruit juice. Meats and other protein foods  Fatty cuts of meat.  Ribs, chicken wings, bacon, sausage, bologna, salami, chitterlings, fatback, hot dogs, bratwurst, and packaged luncheon meats. Liver and organ meats. Dairy  Whole or 2% milk, cream, half-and-half, and cream cheese. Whole milk cheeses. Whole-fat or sweetened yogurt. Full-fat cheeses. Nondairy creamers and whipped toppings. Processed cheese, cheese spreads, or cheese curds. Beverages  Alcohol. Sweetened drinks (such as sodas, lemonade, and fruit drinks or punches). Fats and oils  Butter, stick margarine, lard, shortening, ghee, or bacon fat. Coconut, palm kernel, or palm oils. Sweets and desserts  Corn syrup, sugars, honey, and molasses. Candy. Jam and jelly. Syrup. Sweetened cereals. Cookies, pies, cakes, donuts, muffins, and ice cream. The items listed above may not be a complete list of foods and beverages to avoid. Contact your dietitian for more information. This information is not intended to replace advice given to you by your health care provider. Make sure you discuss any questions you have with your health care provider. Document Released: 06/23/2005 Document Revised: 07/14/2014 Document Reviewed: 09/21/2013 Elsevier Interactive Patient Education  2017 [employer].

## 2016-12-07 ENCOUNTER — Encounter: Payer: Self-pay | Admitting: Physician Assistant

## 2016-12-17 DIAGNOSIS — J301 Allergic rhinitis due to pollen: Secondary | ICD-10-CM | POA: Diagnosis not present

## 2016-12-17 DIAGNOSIS — J3081 Allergic rhinitis due to animal (cat) (dog) hair and dander: Secondary | ICD-10-CM | POA: Diagnosis not present

## 2016-12-17 DIAGNOSIS — R0602 Shortness of breath: Secondary | ICD-10-CM | POA: Diagnosis not present

## 2016-12-17 DIAGNOSIS — J3089 Other allergic rhinitis: Secondary | ICD-10-CM | POA: Diagnosis not present

## 2016-12-18 ENCOUNTER — Other Ambulatory Visit: Payer: Self-pay | Admitting: Physician Assistant

## 2016-12-18 DIAGNOSIS — I10 Essential (primary) hypertension: Secondary | ICD-10-CM

## 2016-12-22 ENCOUNTER — Encounter: Payer: Self-pay | Admitting: Physician Assistant

## 2016-12-22 DIAGNOSIS — J309 Allergic rhinitis, unspecified: Secondary | ICD-10-CM | POA: Insufficient documentation

## 2016-12-22 DIAGNOSIS — T781XXA Other adverse food reactions, not elsewhere classified, initial encounter: Secondary | ICD-10-CM | POA: Insufficient documentation

## 2016-12-28 ENCOUNTER — Encounter: Payer: Self-pay | Admitting: Physician Assistant

## 2017-01-16 ENCOUNTER — Ambulatory Visit (INDEPENDENT_AMBULATORY_CARE_PROVIDER_SITE_OTHER): Payer: BLUE CROSS/BLUE SHIELD | Admitting: Physician Assistant

## 2017-01-16 ENCOUNTER — Encounter: Payer: Self-pay | Admitting: Physician Assistant

## 2017-01-16 VITALS — BP 110/66 | HR 69 | Wt 159.0 lb

## 2017-01-16 DIAGNOSIS — R490 Dysphonia: Secondary | ICD-10-CM | POA: Insufficient documentation

## 2017-01-16 NOTE — Progress Notes (Signed)
HPI:                                                                Emily Ponce is a 51 y.o. female who presents to [facility] today for "hoarse voice"  Patient reports voice hoarseness intermittently for 2 months. Endorses intermittent sore throat and urge to clear her throat. Denies cough, dysphagia, globus sensation.  She does have a history of GERD and has not been taking Zantac consistently. Former smoker, quit date 2011.  Past Medical History:  Diagnosis Date  . Anxiety   . Broken neck ([place])    c-7  . Hypertension   . Ovarian cyst    Past Surgical History:  Procedure Laterality Date  . CARPAL TUNNEL RELEASE Left   . esure     . TUBAL LIGATION     Social History  Substance Use Topics  . Smoking status: Former Smoker    Quit date: 04/24/2010  . Smokeless tobacco: Never Used  . Alcohol use Yes     Comment: occasional   family history includes Alzheimer's disease in her father; Cancer in her sister; Heart attack in her mother; Hypertension in her father and mother.  ROS: negative except as noted in the HPI  Medications: Current Outpatient Prescriptions  Medication Sig Dispense Refill  . ALPRAZolam (XANAX) 0.5 MG tablet TAKE 1/2 TO 1 TABLET BY MOUTH EVERY NIGHT AT BEDTIME AS NEEDED 30 tablet 2  . atenolol (TENORMIN) 25 MG tablet Take 2 tablets (50 mg total) by mouth daily. 180 tablet 2  . Black Cohosh 160 MG CAPS Take by mouth.    . Calcium Carbonate-Vitamin D 600-400 MG-UNIT tablet Take 1 tablet by mouth 2 (two) times daily. 60 tablet 11  . Cyanocobalamin (B-12 COMPLIANCE INJECTION IJ) Inject as directed.    . Flaxseed, Linseed, (FLAX SEEDS) POWD Take 1 tablet by mouth.    . folic acid (FOLVITE) 350 MCG tablet Take 400 mcg by mouth daily.    . hydrochlorothiazide (HYDRODIURIL) 25 MG tablet Take 1 tablet (25 mg total) by mouth daily. 90 tablet 2  . Omega-3 1000 MG CAPS Take 1 g by mouth.    . pyridoxine  (B-6) 100 MG tablet Take 100 mg by mouth daily.    . ranitidine (ZANTAC) 150 MG tablet Take 150 mg by mouth 2 (two) times daily.    . Thiamine HCl (VITAMIN B-1) 250 MG tablet Take 250 mg by mouth daily.    . phentermine (ADIPEX-P) 37.5 MG tablet Take 37.5 mg by mouth daily before breakfast.     No current facility-administered medications for this visit.    Allergies  Allergen Reactions  . Lyrica [Pregabalin]        Objective:  BP 110/66   Pulse 69   Wt 159 lb (72.1 kg)   SpO2 98%   BMI 26.46 kg/m  Gen: well-groomed, cooperative, not ill-appearing, no distress HEENT: normal conjunctiva, neck supple, trachea midline Pulm: Normal work of breathing, normal phonation, patient frequently clears throat GI: abdomen soft, nondistended, there is epigastric tenderness, no rebound, no guarding, no masses Neuro: alert and oriented x 3, EOM's intact, no tremor MSK: moving all extremities, normal gait and station Lymph: no cervical or tonsillar adenopathy  Skin: warm, dry, intact; no rashes or lesions on exposed skin, no cyanosis   No results found for this or any previous visit (from the past 72 hour(s)). No results found.    Assessment and Plan: 51 y.o. female with   1. Hoarseness of voice - suspect uncontrolled GERD. Will refer to ENT to r/o laryngeal mass given smoking history - instructed to start Ranitidine scheduled daily - vocal rest - Ambulatory referral to ENT  Patient education and anticipatory guidance given Patient agrees with treatment plan Follow-up as needed if symptoms worsen or fail to improve  Darlyne Russian PA-C

## 2017-01-16 NOTE — Patient Instructions (Addendum)
-   Schedule your ranitidine twice a day - Schedule your singulair/montelukast nightly - You will receive a phone call to follow-up with ENT specialist    Hoarseness Hoarseness is any abnormal change in your voice.Hoarseness can make it difficult to speak. Your voice may sound raspy, breathy, or strained. Hoarseness is caused by a problem with the vocal cords. The vocal cords are two bands of tissue inside your voice box (larynx). When you speak, your vocal cords move back and forth to create sound. The surfaces of your vocal cords need to be smooth for your voice to sound clear. Swelling or lumps on the vocal cords can cause hoarseness. Common causes of vocal cord problems include:  Upper airway infection.  A long-term cough.  Straining or overusing your voice.  Smoking.  Allergies.  Vocal cord growths.  Stomach acids that flow up from your stomach and irritate your vocal cords (gastroesophageal reflux).  Follow these instructions at home: Watch your condition for any changes. To ease any discomfort that you feel:  Rest your voice. Do not whisper. Whispering can cause muscle strain.  Do not speak in a loud or harsh voice that makes your hoarseness worse.  Do not use any tobacco products, including cigarettes, chewing tobacco, or electronic cigarettes. If you need help quitting, ask your health care provider.  Avoid secondhand smoke.  Do not eat foods that give you heartburn. Heartburn can make gastroesophageal reflux worse.  Do not drink coffee.  Do not drink alcohol.  Drink enough fluids to keep your urine clear or pale yellow.  Use a humidifier if the air in your home is dry.  Contact a health care provider if:  You have hoarseness that lasts longer than 3 weeks.  You almost lose or completelylose your voice for longer than 3 days.  You have pain when you swallow or try to talk.  You feel a lump in your neck. Get help right away if:  You have trouble  swallowing.  You feel as though you are choking when you swallow.  You cough up blood or vomit blood.  You have trouble breathing. This information is not intended to replace advice given to you by your health care provider. Make sure you discuss any questions you have with your health care provider. Document Released: 06/06/2005 Document Revised: 11/29/2015 Document Reviewed: 06/14/2014 Elsevier Interactive Patient Education  [employer].

## 2017-01-19 ENCOUNTER — Other Ambulatory Visit: Payer: Self-pay | Admitting: Physician Assistant

## 2017-01-19 DIAGNOSIS — I10 Essential (primary) hypertension: Secondary | ICD-10-CM

## 2017-01-21 ENCOUNTER — Emergency Department
Admission: EM | Admit: 2017-01-21 | Discharge: 2017-01-21 | Disposition: A | Discharge status: Home / Self Care | Payer: BLUE CROSS/BLUE SHIELD | Source: Home / Self Care | Attending: Family Medicine | Admitting: Family Medicine

## 2017-01-21 ENCOUNTER — Encounter: Payer: Self-pay | Admitting: *Deleted

## 2017-01-21 DIAGNOSIS — S39012A Strain of muscle, fascia and tendon of lower back, initial encounter: Secondary | ICD-10-CM

## 2017-01-21 DIAGNOSIS — M5441 Lumbago with sciatica, right side: Secondary | ICD-10-CM

## 2017-01-21 DIAGNOSIS — M5442 Lumbago with sciatica, left side: Secondary | ICD-10-CM

## 2017-01-21 MED ORDER — ACETAMINOPHEN 325 MG PO TABS
650.0000 mg | ORAL_TABLET | Freq: Once | ORAL | Status: AC
  Administered 2017-01-21: 650 mg via ORAL

## 2017-01-21 NOTE — ED Provider Notes (Signed)
CSN: [id number]     Arrival date & time 01/21/17  1105 History   First MD Initiated Contact with Patient 01/21/17 1131     Chief Complaint  Patient presents with  . Back Pain   (Consider location/radiation/quality/duration/timing/severity/associated sxs/prior Treatment) HPI  Emily Ponce is a 51 y.o. female presenting to UC with c/o intermittent low back pain that started a few days ago but worsened last night, radiating down both legs to her knees.  Pain is aching and sore, moderate in severity. She has been taking Aleve and Ibuprophen 800mg  with mild relief. She has also taken Flexeril at home.  Denies numbness in groin or legs. No change in bowel or bladder habits. No recent injury but she does work out.  Hx of broken neck but no prior lower back injuries that she can recall.    Past Medical History:  Diagnosis Date  . Anxiety   . Broken neck ([place])    c-7  . Hypertension   . Ovarian cyst    Past Surgical History:  Procedure Laterality Date  . CARPAL TUNNEL RELEASE Left   . esure     . TUBAL LIGATION     Family History  Problem Relation Age of Onset  . Heart attack Mother   . Hypertension Mother   . Alzheimer's disease Father   . Hypertension Father   . Cancer Sister        cervical   Social History  Substance Use Topics  . Smoking status: Former Smoker    Quit date: 04/24/2010  . Smokeless tobacco: Never Used  . Alcohol use Yes     Comment: occasional   OB History    Gravida Para Term Preterm AB Living   3 2     1 2    SAB TAB Ectopic Multiple Live Births   1             Review of Systems  Genitourinary: Negative for dysuria, flank pain, frequency, hematuria and urgency.  Musculoskeletal: Positive for back pain and myalgias. Negative for arthralgias and gait problem.  Skin: Negative for rash.  Neurological: Negative for weakness and numbness.    Allergies  Chantix [varenicline] and Lyrica [pregabalin]  Home Medications   Prior to Admission  medications   Medication Sig Start Date End Date Taking? Authorizing Provider  ALPRAZolam Duanne Moron) 0.5 MG tablet TAKE 1/2 TO 1 TABLET BY MOUTH EVERY NIGHT AT BEDTIME AS NEEDED 12/05/16   Trixie Dredge, PA-C  atenolol (TENORMIN) 25 MG tablet Take 2 tablets (50 mg total) by mouth daily. 12/05/16   Trixie Dredge, PA-C  Black Cohosh 160 MG CAPS Take by mouth.    [provider]  Calcium Carbonate-Vitamin D 600-400 MG-UNIT tablet Take 1 tablet by mouth 2 (two) times daily. 12/05/16   Trixie Dredge, PA-C  Cyanocobalamin (B-12 COMPLIANCE INJECTION IJ) Inject as directed.    [provider]  EPINEPHrine 0.3 mg/0.3 mL IJ SOAJ injection  12/17/16   [provider]  Flaxseed, Linseed, (FLAX SEEDS) POWD Take 1 tablet by mouth.    [provider]  folic acid (FOLVITE) 381 MCG tablet Take 400 mcg by mouth daily.    [provider]  hydrochlorothiazide (HYDRODIURIL) 25 MG tablet Take 1 tablet (25 mg total) by mouth daily. 12/05/16   Trixie Dredge, PA-C  montelukast (SINGULAIR) 10 MG tablet Take 10 mg by mouth at bedtime. 12/17/16   [provider]  Omega-3 1000 MG CAPS Take  1 g by mouth.    [provider]  pyridoxine (B-6) 100 MG tablet Take 100 mg by mouth daily.    [provider]  ranitidine (ZANTAC) 150 MG tablet Take 150 mg by mouth 2 (two) times daily.    [provider]  Thiamine HCl (VITAMIN B-1) 250 MG tablet Take 250 mg by mouth daily.    [provider]   Meds Ordered and Administered this Visit   Medications  acetaminophen (TYLENOL) tablet 650 mg (650 mg Oral Given 01/21/17 1133)    BP 100/65 (BP Location: Left Arm)   Pulse 83   Wt 159 lb (72.1 kg)   SpO2 98%   BMI 26.46 kg/m  No data found.   Physical Exam  Constitutional: She is oriented to person, place, and time. She appears well-developed and well-nourished. No distress.  HENT:  Head:  Normocephalic and atraumatic.  Eyes: EOM are normal.  Neck: Normal range of motion.  Cardiovascular: Normal rate and regular rhythm.   Pulmonary/Chest: Effort normal and breath sounds normal. No respiratory distress. She has no wheezes. She has no rales.  Musculoskeletal: Normal range of motion. She exhibits tenderness. She exhibits no edema.  Tenderness to lower lumbar spine and surrounding paraspinal muscles. Slight increased pain with Right straight leg raise. Full ROM upper and lower extremities with 5/5 strength. Normal gait.  Neurological: She is alert and oriented to person, place, and time.  Skin: Skin is warm and dry. She is not diaphoretic. No erythema.  Psychiatric: She has a normal mood and affect. Her behavior is normal.  Nursing note and vitals reviewed.   Urgent Care Course     Procedures (including critical care time)  Labs Review Labs Reviewed - No data to display  Imaging Review No results found.   MDM   1. Low back strain, initial encounter   2. Acute midline low back pain with bilateral sciatica    Hx and exam c/w low back strain. Discussed steroids. Pt declined due to concern for weight gain.   Pt already has Flexeril at home. Encouraged alternating cool and warm compresses. May take Aleve or Ibuprofen but discourage pt from taking both as both are NSAIDs and can cause stomach ulcers/upset.  F/u with PCP or Sports Medicine in 1-2 weeks if not improving. May need imaging and/or referral to PT.     Noe Gens, [place] 01/21/17 1238

## 2017-01-21 NOTE — ED Triage Notes (Signed)
Patient c/o h/o intermittent low back pain. Pain became worse last night radiating down to her knees. Taken Aleve and 800mg  IBF.

## 2017-01-21 NOTE — Discharge Instructions (Signed)
° °  Please take either Aleve or Ibuprofen but not both as both are considered NSAIDs and can cause stomach ulcers.    You may take your flexeril as prescribed and alternate cool and warm compresses.

## 2017-01-25 ENCOUNTER — Other Ambulatory Visit: Payer: Self-pay | Admitting: Physician Assistant

## 2017-01-25 DIAGNOSIS — I10 Essential (primary) hypertension: Secondary | ICD-10-CM

## 2017-03-05 DIAGNOSIS — H6981 Other specified disorders of Eustachian tube, right ear: Secondary | ICD-10-CM | POA: Diagnosis not present

## 2017-03-05 DIAGNOSIS — K219 Gastro-esophageal reflux disease without esophagitis: Secondary | ICD-10-CM | POA: Diagnosis not present

## 2017-04-03 ENCOUNTER — Other Ambulatory Visit: Payer: Self-pay

## 2017-04-03 DIAGNOSIS — F5105 Insomnia due to other mental disorder: Secondary | ICD-10-CM

## 2017-04-03 DIAGNOSIS — F99 Mental disorder, not otherwise specified: Principal | ICD-10-CM

## 2017-04-03 MED ORDER — ALPRAZOLAM 0.5 MG PO TABS: 0.2500 mg | ORAL_TABLET | Freq: Every evening | ORAL | 0 refills | Status: DC | PRN

## 2017-05-18 ENCOUNTER — Other Ambulatory Visit: Payer: Self-pay | Admitting: Physician Assistant

## 2017-05-18 DIAGNOSIS — F99 Mental disorder, not otherwise specified: Principal | ICD-10-CM

## 2017-05-18 DIAGNOSIS — F5105 Insomnia due to other mental disorder: Secondary | ICD-10-CM

## 2017-05-18 MED ORDER — ALPRAZOLAM 0.5 MG PO TABS: 0.2500 mg | ORAL_TABLET | Freq: Every evening | ORAL | 0 refills | Status: DC | PRN

## 2017-09-03 DIAGNOSIS — L920 Granuloma annulare: Secondary | ICD-10-CM | POA: Diagnosis not present

## 2017-09-14 DIAGNOSIS — Z1231 Encounter for screening mammogram for malignant neoplasm of breast: Secondary | ICD-10-CM | POA: Diagnosis not present

## 2017-09-14 LAB — HM MAMMOGRAPHY

## 2017-09-16 ENCOUNTER — Encounter: Payer: Self-pay | Admitting: Physician Assistant

## 2017-10-02 ENCOUNTER — Ambulatory Visit (INDEPENDENT_AMBULATORY_CARE_PROVIDER_SITE_OTHER): Payer: BLUE CROSS/BLUE SHIELD | Admitting: Physician Assistant

## 2017-10-02 ENCOUNTER — Encounter: Payer: Self-pay | Admitting: Physician Assistant

## 2017-10-02 ENCOUNTER — Ambulatory Visit (INDEPENDENT_AMBULATORY_CARE_PROVIDER_SITE_OTHER): Payer: BLUE CROSS/BLUE SHIELD

## 2017-10-02 VITALS — BP 126/78 | HR 72 | Wt 159.0 lb

## 2017-10-02 DIAGNOSIS — M542 Cervicalgia: Secondary | ICD-10-CM

## 2017-10-02 DIAGNOSIS — Z13220 Encounter for screening for lipoid disorders: Secondary | ICD-10-CM

## 2017-10-02 DIAGNOSIS — E663 Overweight: Secondary | ICD-10-CM | POA: Insufficient documentation

## 2017-10-02 DIAGNOSIS — R072 Precordial pain: Secondary | ICD-10-CM | POA: Diagnosis not present

## 2017-10-02 DIAGNOSIS — G8929 Other chronic pain: Secondary | ICD-10-CM

## 2017-10-02 DIAGNOSIS — Z8249 Family history of ischemic heart disease and other diseases of the circulatory system: Secondary | ICD-10-CM

## 2017-10-02 DIAGNOSIS — Z0000 Encounter for general adult medical examination without abnormal findings: Secondary | ICD-10-CM

## 2017-10-02 DIAGNOSIS — I10 Essential (primary) hypertension: Secondary | ICD-10-CM | POA: Diagnosis not present

## 2017-10-02 DIAGNOSIS — Z130 Encounter for screening for diseases of the blood and blood-forming organs and certain disorders involving the immune mechanism: Secondary | ICD-10-CM

## 2017-10-02 DIAGNOSIS — Z131 Encounter for screening for diabetes mellitus: Secondary | ICD-10-CM | POA: Diagnosis not present

## 2017-10-02 DIAGNOSIS — Z79899 Other long term (current) drug therapy: Secondary | ICD-10-CM | POA: Diagnosis not present

## 2017-10-02 DIAGNOSIS — F419 Anxiety disorder, unspecified: Secondary | ICD-10-CM | POA: Diagnosis not present

## 2017-10-02 DIAGNOSIS — Z8781 Personal history of (healed) traumatic fracture: Secondary | ICD-10-CM

## 2017-10-02 DIAGNOSIS — N951 Menopausal and female climacteric states: Secondary | ICD-10-CM | POA: Insufficient documentation

## 2017-10-02 DIAGNOSIS — F5105 Insomnia due to other mental disorder: Secondary | ICD-10-CM

## 2017-10-02 MED ORDER — ATENOLOL 50 MG PO TABS: 50.0000 mg | ORAL_TABLET | Freq: Every day | ORAL | 1 refills | Status: DC

## 2017-10-02 MED ORDER — HYDROCHLOROTHIAZIDE 25 MG PO TABS: 25.0000 mg | ORAL_TABLET | Freq: Every day | ORAL | 1 refills | Status: DC

## 2017-10-02 MED ORDER — ALPRAZOLAM 0.5 MG PO TABS: 0.2500 mg | ORAL_TABLET | Freq: Every evening | ORAL | 3 refills | Status: DC | PRN

## 2017-10-02 NOTE — Patient Instructions (Signed)
For your blood pressure: - Goal <130/80 - Continue Atenolol and HCTZ - monitor and log blood pressures at home - check around the same time each day in a relaxed setting - Limit salt to <2000 mg/day - Follow DASH eating plan - limit alcohol to 2 standard drinks per day for men and 1 per day for women - avoid tobacco products - weight loss: 7% of current body weight - follow-up every 6 months for your blood pressure   Physical Activity Recommendations for modifying lipids and lowering blood pressure Engage in aerobic physical activity to reduce LDL-cholesterol, non-HDL-cholesterol, and blood pressure  Frequency: 3-4 sessions per week  Intensity: moderate to vigorous  Duration: 40 minutes on average  Physical Activity Recommendations for secondary prevention 1. Aerobic exercise  Frequency: 3-5 sessions per week  Intensity: 50-80% capacity  Duration: 20 - 60 minutes  Examples: walking, treadmill, cycling, rowing, stair climbing, and arm/leg ergometry  2. Resistance exercise  Frequency: 2-3 sessions per week  Intensity: 10-15 repetitions/set to moderate fatigue  Duration: 1-3 sets of 8-10 upper and lower body exercises  Examples: calisthenics, elastic bands, cuff/hand weights, dumbbels, free weights, wall pulleys, and weight machines  Heart-Healthy Lifestyle  Eating a diet rich in vegetables, fruits and whole grains: also includes low-fat dairy products, poultry, fish, legumes, and nuts; limit intake of sweets, sugar-sweetened beverages and red meats  Getting regular exercise  Maintaining a healthy weight  Not smoking or getting help quitting  Staying on top of your health; for some people, lifestyle changes alone may not be enough to prevent a heart attack or stroke. In these cases, taking a statin at the right dose will most likely be necessary
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
## 2017-10-03 LAB — CBC
HCT: 40.2 % (ref 35.0–45.0)
Hemoglobin: 14.1 g/dL (ref 11.7–15.5)
MCH: 31.5 pg (ref 27.0–33.0)
MCHC: 35.1 g/dL (ref 32.0–36.0)
MCV: 89.9 fL (ref 80.0–100.0)
MPV: 11.7 fL (ref 7.5–12.5)
PLATELETS: 192 10*3/uL (ref 140–400)
RBC: 4.47 10*6/uL (ref 3.80–5.10)
RDW: 12.1 % (ref 11.0–15.0)
WBC: 7.2 10*3/uL (ref 3.8–10.8)

## 2017-10-03 LAB — COMPREHENSIVE METABOLIC PANEL
AG Ratio: 2.1 (calc) (ref 1.0–2.5)
ALBUMIN MSPROF: 4.6 g/dL (ref 3.6–5.1)
ALT: 44 U/L — ABNORMAL HIGH (ref 6–29)
AST: 35 U/L (ref 10–35)
Alkaline phosphatase (APISO): 65 U/L (ref 33–130)
BUN: 15 mg/dL (ref 7–25)
CHLORIDE: 99 mmol/L (ref 98–110)
CO2: 34 mmol/L — ABNORMAL HIGH (ref 20–32)
Calcium: 9.7 mg/dL (ref 8.6–10.4)
Creat: 0.96 mg/dL (ref 0.50–1.05)
GLOBULIN: 2.2 g/dL (ref 1.9–3.7)
GLUCOSE: 90 mg/dL (ref 65–99)
Potassium: 3.8 mmol/L (ref 3.5–5.3)
Sodium: 141 mmol/L (ref 135–146)
Total Bilirubin: 0.4 mg/dL (ref 0.2–1.2)
Total Protein: 6.8 g/dL (ref 6.1–8.1)

## 2017-10-03 LAB — LIPID PANEL W/REFLEX DIRECT LDL
CHOL/HDL RATIO: 4.2 (calc) (ref <5.0)
Cholesterol: 226 mg/dL — ABNORMAL HIGH (ref <200)
HDL: 54 mg/dL (ref >50)
LDL Cholesterol (Calc): 147 mg/dL (calc) — ABNORMAL HIGH
Non-HDL Cholesterol (Calc): 172 mg/dL (calc) — ABNORMAL HIGH (ref <130)
Triglycerides: 128 mg/dL (ref <150)

## 2017-10-06 NOTE — Progress Notes (Signed)
Good afternoon Jahari,  Your labs show elevated total and LDL cholesterol. Continue to work on low-fat, heart healthy diet such as DASH or Mediterranean and regular aerobic exercise. No medication is indicated at this time based on your risk of heart disease.  Your liver enzyme is still mildly increased, but stable compared to 1 year ago. This is weight-related non-alcoholic fatty liver disease. Treatment is lifestyle-based, similar to high cholesterol.  Other labs look great! And neck x-ray is unchanged. There is no evidence of fracture or degenerative disease. Recommend following up with Dr. Georgina Snell for your neck pain.  I will call you with the results of your stress test as soon as that is completed.  Best, Evlyn Clines

## 2017-10-09 ENCOUNTER — Ambulatory Visit: Payer: BLUE CROSS/BLUE SHIELD | Admitting: Rehabilitative and Restorative Service Providers"

## 2017-10-21 ENCOUNTER — Other Ambulatory Visit: Payer: Self-pay | Admitting: Physician Assistant

## 2017-10-21 DIAGNOSIS — I10 Essential (primary) hypertension: Secondary | ICD-10-CM

## 2018-01-19 ENCOUNTER — Encounter: Payer: BLUE CROSS/BLUE SHIELD | Admitting: Family Medicine

## 2018-01-19 ENCOUNTER — Encounter: Payer: Self-pay | Admitting: Family Medicine

## 2018-01-19 ENCOUNTER — Ambulatory Visit (INDEPENDENT_AMBULATORY_CARE_PROVIDER_SITE_OTHER): Payer: BLUE CROSS/BLUE SHIELD | Admitting: Family Medicine

## 2018-01-19 VITALS — BP 128/69 | HR 66 | Wt 161.0 lb

## 2018-01-19 DIAGNOSIS — S39012A Strain of muscle, fascia and tendon of lower back, initial encounter: Secondary | ICD-10-CM | POA: Diagnosis not present

## 2018-01-19 MED ORDER — CYCLOBENZAPRINE HCL 10 MG PO TABS: 10.0000 mg | ORAL_TABLET | Freq: Three times a day (TID) | ORAL | 0 refills | Status: DC | PRN
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
## 2018-01-19 NOTE — Patient Instructions (Signed)
Thank you for coming in today. Use a heating pad Use a TENS unit.  Use aspirin for pain as needed.  Use the muscle relaxors at bedtime as needed.  Attend PT.   Recheck in 4-6 weeks if not better.   TENS UNIT: This is helpful for muscle pain and spasm.   Search and Purchase a TENS 7000 2nd edition at  www.tenspros.com or www.[place].com It should be less than $30.     TENS unit instructions: Do not shower or bathe with the unit on Turn the unit off before removing electrodes or batteries If the electrodes lose stickiness add a drop of water to the electrodes after they are disconnected from the unit and place on plastic sheet. If you continued to have difficulty, call the TENS unit company to purchase more electrodes. Do not apply lotion on the skin area prior to use. Make sure the skin is clean and dry as this will help prolong the life of the electrodes. After use, always check skin for unusual red areas, rash or other skin difficulties. If there are any skin problems, does not apply electrodes to the same area. Never remove the electrodes from the unit by pulling the wires. Do not use the TENS unit or electrodes other than as directed. Do not change electrode placement without consultating your therapist or physician. Keep 2 fingers with between each electrode. Wear time ratio is 2:1, on to off times.    For example on for 30 minutes off for 15 minutes and then on for 30 minutes off for 15 minutes     Lumbosacral Strain Lumbosacral strain is an injury that causes pain in the lower back (lumbosacral spine). This injury usually occurs from overstretching the muscles or ligaments along your spine. A strain can affect one or more muscles or cord-like tissues that connect bones to other bones (ligaments). What are the causes? This condition may be caused by:  A hard, direct hit (blow) to the back.  Excessive stretching of the lower back muscles. This may result from: ? A  fall. ? Lifting something heavy. ? Repetitive movements such as bending or crouching.  What increases the risk? The following factors may increase your risk of getting this condition:  Participating in sports or activities that involve: ? A sudden twist of the back. ? Pushing or pulling motions.  Being overweight or obese.  Having poor strength and flexibility, especially tight hamstrings or weak muscles in the back or abdomen.  Having too much of a curve in the lower back.  Having a pelvis that is tilted forward.  What are the signs or symptoms? The main symptom of this condition is pain in the lower back, at the site of the strain. Pain may extend (radiate) down one or both legs. How is this diagnosed? This condition is diagnosed based on:  Your symptoms.  Your medical history.  A physical exam. ? Your health care provider may push on certain areas of your back to determine the source of your pain. ? You may be asked to bend forward, backward, and side to side to assess the severity of your pain and your range of motion.  Imaging tests, such as: ? X-rays. ? MRI.  How is this treated? Treatment for this condition may include:  Putting heat and cold on the affected area.  Medicines to help relieve pain and relax your muscles (muscle relaxants).  NSAIDs to help reduce swelling and discomfort.  When your symptoms improve, it  is important to gradually return to your normal routine as soon as possible to reduce pain, avoid stiffness, and avoid loss of muscle strength. Generally, symptoms should improve within 6 weeks of treatment. However, recovery time varies. Follow these instructions at home: Managing pain, stiffness, and swelling   If directed, put ice on the injured area during the first 24 hours after your strain. ? Put ice in a plastic bag. ? Place a towel between your skin and the bag. ? Leave the ice on for 20 minutes, 2-3 times a day.  If directed, put  heat on the affected area as often as told by your health care provider. Use the heat source that your health care provider recommends, such as a moist heat pack or a heating pad. ? Place a towel between your skin and the heat source. ? Leave the heat on for 20-30 minutes. ? Remove the heat if your skin turns bright red. This is especially important if you are unable to feel pain, heat, or cold. You may have a greater risk of getting burned. Activity  Rest and return to your normal activities as told by your health care provider. Ask your health care provider what activities are safe for you.  Avoid activities that take a lot of energy for as long as told by your health care provider. General instructions  Take over-the-counter and prescription medicines only as told by your health care provider.  Donot drive or use heavy machinery while taking prescription pain medicine.  Do not use any products that contain nicotine or tobacco, such as cigarettes and e-cigarettes. If you need help quitting, ask your health care provider.  Keep all follow-up visits as told by your health care provider. This is important. How is this prevented?  Use correct form when playing sports and lifting heavy objects.  Use good posture when sitting and standing.  Maintain a healthy weight.  Sleep on a mattress with medium firmness to support your back.  Be safe and responsible while being active to avoid falls.  Do at least 150 minutes of moderate-intensity exercise each week, such as brisk walking or water aerobics. Try a form of exercise that takes stress off your back, such as swimming or stationary cycling.  Maintain physical fitness, including: ? Strength. ? Flexibility. ? Cardiovascular fitness. ? Endurance. Contact a health care provider if:  Your back pain does not improve after 6 weeks of treatment.  Your symptoms get worse. Get help right away if:  Your back pain is severe.  You cannot  stand or walk.  You have difficulty controlling when you urinate or when you have a bowel movement.  You feel nauseous or you vomit.  Your feet get very cold.  You have numbness, tingling, weakness, or problems using your arms or legs.  You develop any of the following: ? Shortness of breath. ? Dizziness. ? Pain in your legs. ? Weakness in your buttocks or legs. ? Discoloration of the skin on your toes or legs. This information is not intended to replace advice given to you by your health care provider. Make sure you discuss any questions you have with your health care provider. Document Released: 04/02/2005 Document Revised: 01/11/2016 Document Reviewed: 11/25/2015 Elsevier Interactive Patient Education  [employer].

## 2018-02-13 ENCOUNTER — Other Ambulatory Visit: Payer: Self-pay | Admitting: Physician Assistant

## 2018-02-13 DIAGNOSIS — Z79899 Other long term (current) drug therapy: Secondary | ICD-10-CM

## 2018-02-13 DIAGNOSIS — F419 Anxiety disorder, unspecified: Secondary | ICD-10-CM

## 2018-02-13 DIAGNOSIS — F5105 Insomnia due to other mental disorder: Principal | ICD-10-CM

## 2018-02-24 ENCOUNTER — Ambulatory Visit (INDEPENDENT_AMBULATORY_CARE_PROVIDER_SITE_OTHER): Payer: BLUE CROSS/BLUE SHIELD | Admitting: Physician Assistant

## 2018-02-24 ENCOUNTER — Encounter: Payer: Self-pay | Admitting: Physician Assistant

## 2018-02-24 VITALS — BP 118/79 | HR 59 | Wt 163.0 lb

## 2018-02-24 DIAGNOSIS — I10 Essential (primary) hypertension: Secondary | ICD-10-CM | POA: Diagnosis not present

## 2018-02-24 DIAGNOSIS — R0789 Other chest pain: Secondary | ICD-10-CM

## 2018-02-24 DIAGNOSIS — R001 Bradycardia, unspecified: Secondary | ICD-10-CM | POA: Diagnosis not present

## 2018-02-24 DIAGNOSIS — Z1329 Encounter for screening for other suspected endocrine disorder: Secondary | ICD-10-CM

## 2018-02-24 NOTE — Patient Instructions (Signed)
Nonspecific Chest Pain °Chest pain can be caused by many different conditions. There is always a chance that your pain could be related to something serious, such as a heart attack or a blood clot in your lungs. Chest pain can also be caused by conditions that are not life-threatening. If you have chest pain, it is very important to follow up with your health care provider. °What are the causes? °Causes of this condition include: °· Heartburn. °· Pneumonia or bronchitis. °· Anxiety or stress. °· Inflammation around your heart (pericarditis) or lung (pleuritis or pleurisy). °· A blood clot in your lung. °· A collapsed lung (pneumothorax). This can develop suddenly on its own (spontaneous pneumothorax) or from trauma to the chest. °· Shingles infection (varicella-zoster virus). °· Heart attack. °· Damage to the bones, muscles, and cartilage that make up your chest wall. This can include: °? Bruised bones due to injury. °? Strained muscles or cartilage due to frequent or repeated coughing or overwork. °? Fracture to one or more ribs. °? Sore cartilage due to inflammation (costochondritis). ° °What increases the risk? °Risk factors for this condition may include: °· Activities that increase your risk for trauma or injury to your chest. °· Respiratory infections or conditions that cause frequent coughing. °· Medical conditions or overeating that can cause heartburn. °· Heart disease or family history of heart disease. °· Conditions or health behaviors that increase your risk of developing a blood clot. °· Having had chicken pox (varicella zoster). ° °What are the signs or symptoms? °Chest pain can feel like: °· Burning or tingling on the surface of your chest or deep in your chest. °· Crushing, pressure, aching, or squeezing pain. °· Dull or sharp pain that is worse when you move, cough, or take a deep breath. °· Pain that is also felt in your back, neck, shoulder, or arm, or pain that spreads to any of these  areas. ° °Your chest pain may come and go, or it may stay constant. °How is this diagnosed? °Lab tests or other studies may be needed to find the cause of your pain. Your health care provider may have you take a test called an ECG (electrocardiogram). An ECG records your heartbeat patterns at the time the test is performed. You may also have other tests, such as: °· Transthoracic echocardiogram (TTE). In this test, sound waves are used to create a picture of the heart structures and to look at how blood flows through your heart. °· Transesophageal echocardiogram (TEE). This is a more advanced imaging test that takes images from inside your body. It allows your health care provider to see your heart in finer detail. °· Cardiac monitoring. This allows your health care provider to monitor your heart rate and rhythm in real time. °· Holter monitor. This is a portable device that records your heartbeat and can help to diagnose abnormal heartbeats. It allows your health care provider to track your heart activity for several days, if needed. °· Stress tests. These can be done through exercise or by taking medicine that makes your heart beat more quickly. °· Blood tests. °· Other imaging tests. ° °How is this treated? °Treatment depends on what is causing your chest pain. Treatment may include: °· Medicines. These may include: °? Acid blockers for heartburn. °? Anti-inflammatory medicine. °? Pain medicine for inflammatory conditions. °? Antibiotic medicine, if an infection is present. °? Medicines to dissolve blood clots. °? Medicines to treat coronary artery disease (CAD). °· Supportive care for conditions that   do not require medicines. This may include: °? Resting. °? Applying heat or cold packs to injured areas. °? Limiting activities until pain decreases. ° °Follow these instructions at home: °Medicines °· If you were prescribed an antibiotic, take it as told by your health care provider. Do not stop taking the  antibiotic even if you start to feel better. °· Take over-the-counter and prescription medicines only as told by your health care provider. °Lifestyle °· Do not use any products that contain nicotine or tobacco, such as cigarettes and e-cigarettes. If you need help quitting, ask your health care provider. °· Do not drink alcohol. °· Make lifestyle changes as directed by your health care provider. These may include: °? Getting regular exercise. Ask your health care provider to suggest some activities that are safe for you. °? Eating a heart-healthy diet. A registered dietitian can help you to learn healthy eating options. °? Maintaining a healthy weight. °? Managing diabetes, if necessary. °? Reducing stress, such as with yoga or relaxation techniques. °General instructions °· Avoid any activities that bring on chest pain. °· If heartburn is the cause for your chest pain, raise (elevate) the head of your bed about 6 inches (15 cm) by putting blocks under the legs. Sleeping with more pillows does not effectively relieve heartburn because it only changes the position of your head. °· Keep all follow-up visits as told by your health care provider. This is important. This includes any further testing if your chest pain does not go away. °Contact a health care provider if: °· Your chest pain does not go away. °· You have a rash with blisters on your chest. °· You have a fever. °· You have chills. °Get help right away if: °· Your chest pain is worse. °· You have a cough that gets worse, or you cough up blood. °· You have severe pain in your abdomen. °· You have severe weakness. °· You faint. °· You have sudden, unexplained chest discomfort. °· You have sudden, unexplained discomfort in your arms, back, neck, or jaw. °· You have shortness of breath at any time. °· You suddenly start to sweat, or your skin gets clammy. °· You feel nauseous or you vomit. °· You suddenly feel light-headed or dizzy. °· Your heart begins to beat  quickly, or it feels like it is skipping beats. °These symptoms may represent a serious problem that is an emergency. Do not wait to see if the symptoms will go away. Get medical help right away. Call your local emergency services (911 in the [place] Do not drive yourself to the hospital. °This information is not intended to replace advice given to you by your health care provider. Make sure you discuss any questions you have with your health care provider. °Document Released: 04/02/2005 Document Revised: 03/17/2016 Document Reviewed: 03/17/2016 °Elsevier Interactive Patient Education © 2017 [employer] ° °

## 2018-02-24 NOTE — Progress Notes (Signed)
HPI:                                                                Emily Ponce is a 52 y.o. female who presents to [place]: [place] today for chest pain  Chest Pain   This is a new problem. The current episode started yesterday. The onset quality is sudden. The problem occurs intermittently (lasting 1-2 hours). The problem has been resolved. The pain is present in the lateral region (right lateral). The pain is moderate. The quality of the pain is described as crushing. The pain does not radiate. Pertinent negatives include no abdominal pain, cough, diaphoresis, exertional chest pressure, hemoptysis, nausea, near-syncope, palpitations or shortness of breath. The pain is aggravated by nothing. She has tried NSAIDs (aspirin, exercise) for the symptoms. The treatment provided significant relief. Risk factors include post-menopausal and stress.  Her past medical history is significant for aneurysm, anxiety/panic attacks, hyperlipidemia and hypertension.  Her family medical history is significant for CAD.  Reports symptoms began at rest while at work. Endorses increased stress at work.    Depression screen PHQ 2/9 12/07/2016  Decreased Interest 0  Down, Depressed, Hopeless 0  PHQ - 2 Score 0    GAD 7 : Generalized Anxiety Score 12/07/2016  Nervous, Anxious, on Edge 0  Control/stop worrying 0  Worry too much - different things 0  Trouble relaxing 0  Restless 0  Easily annoyed or irritable 0  Afraid - awful might happen 0  Total GAD 7 Score 0      Past Medical History:  Diagnosis Date  . Anxiety   . Broken neck ([place])    c-7  . Hypertension   . Ovarian cyst    Past Surgical History:  Procedure Laterality Date  . CARPAL TUNNEL RELEASE Left   . esure     . TUBAL LIGATION     Social History   Tobacco Use  . Smoking status: Former Smoker    Last attempt to quit: 04/24/2010    Years since quitting: 7.8  . Smokeless tobacco:  Never Used  Substance Use Topics  . Alcohol use: Yes    Comment: occasional   family history includes Alzheimer's disease in her father; Cancer in her sister; Heart attack in her mother; Hypertension in her father and mother.    ROS: negative except as noted in the HPI  Medications: Current Outpatient Medications  Medication Sig Dispense Refill  . ALPRAZolam (XANAX) 0.5 MG tablet Take 0.5-1 tablets (0.25-0.5 mg total) by mouth at bedtime as needed for anxiety. Due for follow up appointment in September 30 tablet 0  . atenolol (TENORMIN) 50 MG tablet Take 1 tablet (50 mg total) by mouth daily. 90 tablet 1  . Black Cohosh 160 MG CAPS Take by mouth.    . Calcium Carbonate-Vitamin D 600-400 MG-UNIT tablet Take 1 tablet by mouth 2 (two) times daily. 60 tablet 11  . cyclobenzaprine (FLEXERIL) 10 MG tablet Take 1 tablet (10 mg total) by mouth 3 (three) times daily as needed for muscle spasms. 30 tablet 0  . Flaxseed, Linseed, (FLAX SEEDS) POWD Take 1 tablet by mouth.    . folic acid (FOLVITE) 601 MCG tablet Take 400 mcg by mouth daily.    Marland Kitchen  hydrochlorothiazide (HYDRODIURIL) 25 MG tablet Take 1 tablet (25 mg total) by mouth daily. 90 tablet 1  . Omega-3 1000 MG CAPS Take 1 g by mouth.    . Thiamine HCl (VITAMIN B-1) 250 MG tablet Take 250 mg by mouth daily.     No current facility-administered medications for this visit.    Allergies  Allergen Reactions  . Chantix [Varenicline]     Dreams       Objective:  BP 118/79   Pulse (!) 59   Wt 163 lb (73.9 kg)   LMP  (LMP Unknown) Comment: LMP 2017  BMI 27.12 kg/m  Gen:  alert, not ill-appearing, no distress, appropriate for age 33: head normocephalic without obvious abnormality, conjunctiva and cornea clear, trachea midline Pulm: Normal work of breathing, phonation loud, clear to auscultation bilaterally, no wheezes, rales or rhonchi CV: bradycardic, regular rhythm, s1 and s2 distinct, no murmurs, clicks or rubs  Neuro: alert and  oriented x 3, no tremor MSK: chest wall atraumatic and nontender, extremities atraumatic, normal gait and station, no peripheral edema Skin: intact, no rashes on exposed skin, no jaundice, no cyanosis Psych: well-groomed, cooperative, good eye contact, euthymic mood, affect mood-congruent, speech is articulate, and thought processes clear and goal-directed  ECG 02/24/2018 14:47 Vent rate 58 bpm PR-I 174 ms QRS 86 ms QT/QTc 430/422 ms Sinus bradycardia   No results found for this or any previous visit (from the past 72 hour(s)). No results found.    Assessment and Plan: 52 y.o. female with   .Emily Ponce was seen today for chest pain.  Diagnoses and all orders for this visit:  Atypical chest pain -     TSH + free T4 -     Basic metabolic panel  Screening for thyroid disorder -     TSH + free T4   BP normotensive ECG today showing sinus brady at 58 bpm (on a beta blocker), normal axis, no ST or T wave abnormalities, normal QTc Low suspicion for ACS, however, she does have multiple CVD risk factors and should have routine stress test Checking TSH and BMP to r/o electrolyte disturbance or thyroid disorder ETT ordered 10/02/17, we will make sure stress test is scheduled   Patient education and anticipatory guidance given Patient agrees with treatment plan Follow-up as needed if symptoms worsen or fail to improve  Darlyne Russian PA-C

## 2018-03-03 ENCOUNTER — Encounter: Payer: Self-pay | Admitting: Physician Assistant

## 2018-03-03 ENCOUNTER — Ambulatory Visit (INDEPENDENT_AMBULATORY_CARE_PROVIDER_SITE_OTHER): Payer: BLUE CROSS/BLUE SHIELD

## 2018-03-03 DIAGNOSIS — R072 Precordial pain: Secondary | ICD-10-CM | POA: Diagnosis not present

## 2018-03-03 DIAGNOSIS — Z8249 Family history of ischemic heart disease and other diseases of the circulatory system: Secondary | ICD-10-CM

## 2018-03-03 LAB — EXERCISE TOLERANCE TEST
CHL CUP MPHR: 166 {beats}/min
CHL CUP RESTING HR STRESS: 60 {beats}/min
CHL RATE OF PERCEIVED EXERTION: 17
CSEPEDS: 53 s
CSEPHR: 85 %
Estimated workload: 11.5 METS
Exercise duration (min): 9 min
Peak HR: 142 {beats}/min

## 2018-03-16 NOTE — Addendum Note (Signed)
Addended by: Huel Cote on: 03/16/2018 04:02 PM   Modules accepted: Orders

## 2018-03-25 ENCOUNTER — Encounter: Payer: Self-pay | Admitting: Physician Assistant

## 2018-03-25 ENCOUNTER — Ambulatory Visit (INDEPENDENT_AMBULATORY_CARE_PROVIDER_SITE_OTHER): Payer: BLUE CROSS/BLUE SHIELD | Admitting: Physician Assistant

## 2018-03-25 VITALS — BP 116/76 | HR 82 | Temp 98.2°F | Ht 65.0 in | Wt 165.0 lb

## 2018-03-25 DIAGNOSIS — R300 Dysuria: Secondary | ICD-10-CM | POA: Diagnosis not present

## 2018-03-25 DIAGNOSIS — R350 Frequency of micturition: Secondary | ICD-10-CM | POA: Diagnosis not present

## 2018-03-25 DIAGNOSIS — R3129 Other microscopic hematuria: Secondary | ICD-10-CM

## 2018-03-25 DIAGNOSIS — R82998 Other abnormal findings in urine: Secondary | ICD-10-CM | POA: Diagnosis not present

## 2018-03-25 LAB — POCT URINALYSIS DIPSTICK
Bilirubin, UA: NEGATIVE
Glucose, UA: NEGATIVE
KETONES UA: NEGATIVE
NITRITE UA: POSITIVE
PROTEIN UA: POSITIVE — AB
SPEC GRAV UA: 1.01 (ref 1.010–1.025)
Urobilinogen, UA: 0.2 E.U./dL
pH, UA: 6 (ref 5.0–8.0)

## 2018-03-25 MED ORDER — PHENAZOPYRIDINE HCL 200 MG PO TABS: 200.0000 mg | ORAL_TABLET | Freq: Three times a day (TID) | ORAL | 0 refills | Status: DC | PRN

## 2018-03-25 MED ORDER — CEFUROXIME AXETIL 250 MG PO TABS: 250.0000 mg | ORAL_TABLET | Freq: Two times a day (BID) | ORAL | 0 refills | Status: AC

## 2018-03-25 NOTE — Progress Notes (Signed)
HPI:                                                                Emily Ponce is a 52 y.o. female who presents to [facility]: [place] today for dysuria  Dysuria   This is a new problem. The current episode started in the past 7 days (x 4-5 days). The problem occurs every urination. The problem has been unchanged. The quality of the pain is described as burning. The pain is moderate. There is no history of pyelonephritis. Associated symptoms include chills, frequency, nausea, sweats and urgency. Pertinent negatives include no discharge, flank pain, hematuria, possible pregnancy or vomiting. She has tried NSAIDs for the symptoms. There is no history of recurrent UTIs or a urological procedure.     Past Medical History:  Diagnosis Date  . Anxiety   . Broken neck ([place])    c-7  . Hypertension   . Ovarian cyst    Past Surgical History:  Procedure Laterality Date  . CARPAL TUNNEL RELEASE Left   . esure     . TUBAL LIGATION     Social History   Tobacco Use  . Smoking status: Former Smoker    Last attempt to quit: 04/24/2010    Years since quitting: 7.9  . Smokeless tobacco: Never Used  Substance Use Topics  . Alcohol use: Yes    Comment: occasional   family history includes Alzheimer's disease in her father; Cancer in her sister; Heart attack in her mother; Hypertension in her father and mother.    ROS: negative except as noted in the HPI  Medications: Current Outpatient Medications  Medication Sig Dispense Refill  . ALPRAZolam (XANAX) 0.5 MG tablet Take 0.5-1 tablets (0.25-0.5 mg total) by mouth at bedtime as needed for anxiety. Due for follow up appointment in September 30 tablet 0  . atenolol (TENORMIN) 50 MG tablet Take 1 tablet (50 mg total) by mouth daily. 90 tablet 1  . Black Cohosh 160 MG CAPS Take by mouth.    . Calcium Carbonate-Vitamin D 600-400 MG-UNIT tablet Take 1 tablet by mouth 2 (two) times daily. 60 tablet  11  . cyclobenzaprine (FLEXERIL) 10 MG tablet Take 1 tablet (10 mg total) by mouth 3 (three) times daily as needed for muscle spasms. 30 tablet 0  . Flaxseed, Linseed, (FLAX SEEDS) POWD Take 1 tablet by mouth.    . folic acid (FOLVITE) 466 MCG tablet Take 400 mcg by mouth daily.    . hydrochlorothiazide (HYDRODIURIL) 25 MG tablet Take 1 tablet (25 mg total) by mouth daily. 90 tablet 1  . Omega-3 1000 MG CAPS Take 1 g by mouth.    . Thiamine HCl (VITAMIN B-1) 250 MG tablet Take 250 mg by mouth daily.    . cefUROXime (CEFTIN) 250 MG tablet Take 1 tablet (250 mg total) by mouth 2 (two) times daily with a meal for 7 days. 14 tablet 0  . phenazopyridine (PYRIDIUM) 200 MG tablet Take 1 tablet (200 mg total) by mouth 3 (three) times daily as needed for pain. 10 tablet 0   No current facility-administered medications for this visit.    Allergies  Allergen Reactions  . Chantix [Varenicline]     Dreams  Objective:  BP 116/76   Pulse 82   Temp 98.2 F (36.8 C) (Oral)   Ht 5\' 5"  (1.651 m)   Wt 165 lb (74.8 kg)   LMP  (LMP Unknown) Comment: LMP 2017  SpO2 97%   BMI 27.46 kg/m  Gen:  alert, not ill-appearing, no distress, appropriate for age 21: head normocephalic without obvious abnormality, conjunctiva and cornea clear, trachea midline Pulm: Normal work of breathing, normal phonation GI: abdomen soft, non-tender, no CVA tenderness Neuro: alert and oriented x 3, no tremor MSK: extremities atraumatic, normal gait and station Skin: intact, no rashes on exposed skin, no jaundice, no cyanosis    Results for orders placed or performed in visit on 03/25/18 (from the past 72 hour(s))  POCT Urinalysis Dipstick     Status: Abnormal   Collection Time: 03/25/18 11:19 AM  Result Value Ref Range   Color, UA yellow    Clarity, UA cloudy    Glucose, UA Negative Negative   Bilirubin, UA negative    Ketones, UA negative    Spec Grav, UA 1.010 1.010 - 1.025   Blood, UA moderate    pH,  UA 6.0 5.0 - 8.0   Protein, UA Positive (A) Negative   Urobilinogen, UA 0.2 0.2 or 1.0 E.U./dL   Nitrite, UA positive    Leukocytes, UA Large (3+) (A) Negative   Appearance     Odor     No results found.    Assessment and Plan: 52 y.o. female with   .Diagnoses and all orders for this visit:  Dysuria -     POCT Urinalysis Dipstick -     Urine Culture -     cefUROXime (CEFTIN) 250 MG tablet; Take 1 tablet (250 mg total) by mouth 2 (two) times daily with a meal for 7 days. -     phenazopyridine (PYRIDIUM) 200 MG tablet; Take 1 tablet (200 mg total) by mouth 3 (three) times daily as needed for pain.  Urine leukocytes -     cefUROXime (CEFTIN) 250 MG tablet; Take 1 tablet (250 mg total) by mouth 2 (two) times daily with a meal for 7 days.  Microscopic hematuria     Afebrile, no tacycardia, no tachypnea, no abdominal/flank pain or CVA tenderness, no risk factors for pyelonephritis UA grossly positive for mod blood, nitrites, large leukocytes and protein Urine culture pending Treating empirically for uncomplicated cystitis with Ceftin  Patient education and anticipatory guidance given Patient agrees with treatment plan Follow-up as needed if symptoms worsen or fail to improve  Darlyne Russian PA-C

## 2018-03-25 NOTE — Patient Instructions (Signed)

## 2018-03-27 LAB — URINE CULTURE
MICRO NUMBER: 91126773
SPECIMEN QUALITY:: ADEQUATE

## 2018-04-12 ENCOUNTER — Other Ambulatory Visit: Payer: Self-pay | Admitting: Physician Assistant

## 2018-04-12 DIAGNOSIS — I10 Essential (primary) hypertension: Secondary | ICD-10-CM

## 2018-04-13 ENCOUNTER — Encounter: Payer: Self-pay | Admitting: Family Medicine

## 2018-04-13 ENCOUNTER — Ambulatory Visit (INDEPENDENT_AMBULATORY_CARE_PROVIDER_SITE_OTHER): Payer: BLUE CROSS/BLUE SHIELD | Admitting: Family Medicine

## 2018-04-13 VITALS — BP 124/65 | HR 74 | Temp 98.8°F | Ht 65.0 in | Wt 163.0 lb

## 2018-04-13 DIAGNOSIS — M542 Cervicalgia: Secondary | ICD-10-CM | POA: Diagnosis not present

## 2018-04-13 DIAGNOSIS — E010 Iodine-deficiency related diffuse (endemic) goiter: Secondary | ICD-10-CM

## 2018-04-13 DIAGNOSIS — R110 Nausea: Secondary | ICD-10-CM

## 2018-04-13 DIAGNOSIS — K5901 Slow transit constipation: Secondary | ICD-10-CM

## 2018-04-13 DIAGNOSIS — M25552 Pain in left hip: Secondary | ICD-10-CM | POA: Diagnosis not present

## 2018-04-13 DIAGNOSIS — R5383 Other fatigue: Secondary | ICD-10-CM | POA: Diagnosis not present

## 2018-04-13 LAB — POCT URINALYSIS DIPSTICK
BILIRUBIN UA: NEGATIVE
GLUCOSE UA: NEGATIVE
Ketones, UA: NEGATIVE
LEUKOCYTES UA: NEGATIVE
Nitrite, UA: NEGATIVE
Protein, UA: POSITIVE — AB
RBC UA: NEGATIVE
Urobilinogen, UA: 0.2 E.U./dL
pH, UA: 6 (ref 5.0–8.0)

## 2018-04-13 NOTE — Progress Notes (Signed)
Subjective:    Patient ID: Emily Ponce, female    DOB: 03-Dec-1965, 52 y.o.   MRN: [id number]  HPI 52 yo female comes in today c/ of nausea for 2 days. Pt was tx for UTI on 03/25/18 she stated that she did not complete the full course of ABX bc she was feeling better. She was given ceftin 250mg  bid x 7 days for an E. coli UTI.   She tends to battle with constipation and takes Metamucil pretty regularly.  She says that her bowels have not been moving quite as well she has had some bowel movements but they have been small.  She denies any pelvic pain or pressure.  No blood in the urine or stool.  She has had chills.  She has not actually vomited.  The nausea seems to be coming in waves.  She has not had any abdominal pain or back pain with it.  Is also concerned because she feels like her thyroid gland has been a little bit tender today.  She wanted me to palpate it and see if I felt like there were any problems.  Also been having some left hip pain for about 6 months.  She says her dog pulled her and yanked her at one point and feels like she may have injured that hip.  The pain is usually over the groin crease.  Again it also comes and goes for when it bothers her sometimes it does not bother her at all and then other times she can just be walking and it was suddenly grab to the point where she can almost not walk.  She has not had any work-up.  Review of Systems  BP 124/65   Pulse 74   Temp 98.8 F (37.1 C)   Ht 5\' 5"  (1.651 m)   Wt 163 lb (73.9 kg)   LMP  (LMP Unknown) Comment: LMP 2017  SpO2 100%   BMI 27.12 kg/m     Allergies  Allergen Reactions  . Chantix [Varenicline]     Dreams    Past Medical History:  Diagnosis Date  . Anxiety   . Broken neck ([place])    c-7  . Hypertension   . Ovarian cyst     Past Surgical History:  Procedure Laterality Date  . CARPAL TUNNEL RELEASE Left   . esure     . TUBAL LIGATION      Social History   Socioeconomic History  .  Marital status: Married    Spouse name: Not on file  . Number of children: Not on file  . Years of education: Not on file  . Highest education level: Not on file  Occupational History  . Occupation: [employer]  Social Needs  . Financial resource strain: Not on file  . Food insecurity:    Worry: Not on file    Inability: Not on file  . Transportation needs:    Medical: Not on file    Non-medical: Not on file  Tobacco Use  . Smoking status: Former Smoker    Last attempt to quit: 04/24/2010    Years since quitting: 7.9  . Smokeless tobacco: Never Used  Substance and Sexual Activity  . Alcohol use: Yes    Comment: occasional  . Drug use: No  . Sexual activity: Yes    Partners: Male    Birth control/protection: Surgical    Comment: Essure  Lifestyle  . Physical activity:    Days per week: Not  on file    Minutes per session: Not on file  . Stress: Not on file  Relationships  . Social connections:    Talks on phone: Not on file    Gets together: Not on file    Attends religious service: Not on file    Active member of club or organization: Not on file    Attends meetings of clubs or organizations: Not on file    Relationship status: Not on file  . Intimate partner violence:    Fear of current or ex partner: Not on file    Emotionally abused: Not on file    Physically abused: Not on file    Forced sexual activity: Not on file  Other Topics Concern  . Not on file  Social History Narrative  . Not on file    Family History  Problem Relation Age of Onset  . Heart attack Mother   . Hypertension Mother   . Alzheimer's disease Father   . Hypertension Father   . Cancer Sister        cervical    Outpatient Encounter Medications as of 04/13/2018  Medication Sig  . ALPRAZolam (XANAX) 0.5 MG tablet Take 0.5-1 tablets (0.25-0.5 mg total) by mouth at bedtime as needed for anxiety. Due for follow up appointment in September  . atenolol (TENORMIN) 50 MG tablet TAKE 1 TABLET(50  MG) BY MOUTH DAILY  . Black Cohosh 160 MG CAPS Take by mouth.  . Calcium Carbonate-Vitamin D 600-400 MG-UNIT tablet Take 1 tablet by mouth 2 (two) times daily.  . Flaxseed, Linseed, (FLAX SEEDS) POWD Take 1 tablet by mouth.  . folic acid (FOLVITE) 254 MCG tablet Take 400 mcg by mouth daily.  . hydrochlorothiazide (HYDRODIURIL) 25 MG tablet TAKE 1 TABLET(25 MG) BY MOUTH DAILY  . Omega-3 1000 MG CAPS Take 1 g by mouth.  . Thiamine HCl (VITAMIN B-1) 250 MG tablet Take 250 mg by mouth daily.  . [DISCONTINUED] cyclobenzaprine (FLEXERIL) 10 MG tablet Take 1 tablet (10 mg total) by mouth 3 (three) times daily as needed for muscle spasms.  . [DISCONTINUED] phenazopyridine (PYRIDIUM) 200 MG tablet Take 1 tablet (200 mg total) by mouth 3 (three) times daily as needed for pain.   No facility-administered encounter medications on file as of 04/13/2018.          Objective:   Physical Exam  Constitutional: She is oriented to person, place, and time. She appears well-developed and well-nourished.  HENT:  Head: Normocephalic and atraumatic.  Right Ear: External ear normal.  Left Ear: External ear normal.  Nose: Nose normal.  Eyes: Conjunctivae are normal.  Neck: Normal range of motion. Thyromegaly present.  Have some mild thyromegaly just slightly larger on the right compared to the left.  Mild tenderness.  But no surrounding erythema or skin rash or induration.  Cardiovascular: Normal rate, regular rhythm and normal heart sounds.  Pulmonary/Chest: Effort normal and breath sounds normal.  Abdominal: Soft. Bowel sounds are normal. She exhibits no distension and no mass. There is tenderness. There is no rebound and no guarding. No hernia.  She does have some mild tenderness in the right upper quadrant but no rebound or guarding.  She is also a little bit tender just lateral to the left side of the umbilicus.  Musculoskeletal:  Tip with normal flexion and extension good strength.  She did have some pain  directly over the hip flexor tendon over the groin crease with flexion against resistance.  Nontender over  the greater trochanter but when I tried to internally and externally rotate her hip she had pain over the lateral portion of her hip.  Lymphadenopathy:    She has no cervical adenopathy.  Neurological: She is alert and oriented to person, place, and time.  Skin: Skin is warm and dry.  Psychiatric: She has a normal mood and affect. Her behavior is normal.        Assessment & Plan:  Nausea -did rule out any residual urinary tract infection.  The urinalysis looks great today so I do think she is fully treated that.  We discussed that she is mildly constipated and that certainly could be contributing so I recommend a trial of MiraLAX to try to get her bowels moving.  Constipation - We discussed that she is mildly constipated and that certainly could be contributing so I recommend a trial of MiraLAX to try to get her bowels moving.  Mild thyromegaly-we will check TSH levels and consider ultrasound for further work-up.  She is a little tender on exam but no erythema.  It is a little larger on the right compared to the left.   Left hip pain -suspect hip flexor tendon strain.  Recommend home exercises.  She says formal PT is quite expensive because of her co-pay.  If she is not improving over the next 3 to 4 weeks and consider x-ray.  She is tender right over the tendon.  But she had pain over the lateral part of the hip with internal and external rotation of the hip.

## 2018-04-13 NOTE — Patient Instructions (Signed)
Recommend a trial of MiraLAX.  Store brand is okay.  Mix 1 capful with 6 to 8 ounces of water or juice and take twice a day until your stools are soft and almost runny.  If your hip is not improving after stretches for the next 3 to 4 weeks and please let us know and we can always order an ultrasound.

## 2018-04-14 ENCOUNTER — Other Ambulatory Visit: Payer: Self-pay | Admitting: Physician Assistant

## 2018-04-14 DIAGNOSIS — Z79899 Other long term (current) drug therapy: Secondary | ICD-10-CM

## 2018-04-14 DIAGNOSIS — F5105 Insomnia due to other mental disorder: Principal | ICD-10-CM

## 2018-04-14 DIAGNOSIS — R5383 Other fatigue: Secondary | ICD-10-CM | POA: Diagnosis not present

## 2018-04-14 DIAGNOSIS — M542 Cervicalgia: Secondary | ICD-10-CM | POA: Diagnosis not present

## 2018-04-14 DIAGNOSIS — R110 Nausea: Secondary | ICD-10-CM | POA: Diagnosis not present

## 2018-04-14 DIAGNOSIS — F419 Anxiety disorder, unspecified: Secondary | ICD-10-CM

## 2018-04-14 LAB — CBC WITH DIFFERENTIAL/PLATELET
BASOS ABS: 59 {cells}/uL (ref 0–200)
Basophils Relative: 1.2 %
EOS ABS: 132 {cells}/uL (ref 15–500)
Eosinophils Relative: 2.7 %
HEMATOCRIT: 42.2 % (ref 35.0–45.0)
Hemoglobin: 14.9 g/dL (ref 11.7–15.5)
Lymphs Abs: 1499 cells/uL (ref 850–3900)
MCH: 31.6 pg (ref 27.0–33.0)
MCHC: 35.3 g/dL (ref 32.0–36.0)
MCV: 89.4 fL (ref 80.0–100.0)
MONOS PCT: 14.5 %
MPV: 11.8 fL (ref 7.5–12.5)
Neutro Abs: 2499 cells/uL (ref 1500–7800)
Neutrophils Relative %: 51 %
Platelets: 172 10*3/uL (ref 140–400)
RBC: 4.72 10*6/uL (ref 3.80–5.10)
RDW: 12.2 % (ref 11.0–15.0)
Total Lymphocyte: 30.6 %
WBC mixed population: 711 cells/uL (ref 200–950)
WBC: 4.9 10*3/uL (ref 3.8–10.8)

## 2018-04-14 LAB — TSH: TSH: 2.04 mIU/L

## 2018-04-15 ENCOUNTER — Encounter: Payer: Self-pay | Admitting: Physician Assistant

## 2018-04-15 ENCOUNTER — Telehealth: Payer: Self-pay

## 2018-04-15 DIAGNOSIS — I671 Cerebral aneurysm, nonruptured: Secondary | ICD-10-CM | POA: Diagnosis not present

## 2018-04-15 DIAGNOSIS — I729 Aneurysm of unspecified site: Secondary | ICD-10-CM | POA: Diagnosis not present

## 2018-04-15 NOTE — Progress Notes (Signed)
All labs are normal. 

## 2018-04-15 NOTE — Telephone Encounter (Signed)
Pt calls with complaints of "same problems" as previously when she saw Dr Madilyn Fireman on 04/13/18.  Pt reports having nausea that comes and goes but denied any vomiting. Pt does report she never started the MiraLax because she developed diarrhea after her appt. Still feels very fatigued and tired and is requesting she be given an antibiotic.  Please advise.Marland Kitchen

## 2018-04-16 MED ORDER — ONDANSETRON 4 MG PO TBDP: 4.0000 mg | ORAL_TABLET | Freq: Three times a day (TID) | ORAL | 0 refills | Status: DC | PRN

## 2018-04-16 MED ORDER — ONDANSETRON HCL 4 MG PO TABS: 4.0000 mg | ORAL_TABLET | Freq: Three times a day (TID) | ORAL | 0 refills | Status: DC | PRN

## 2018-04-16 NOTE — Telephone Encounter (Signed)
Called pt, she has been on Omeprazole 20 mg a day for about a year, this was given to her from ENT. Should she double dose for a couple days? She is interested in seeing GI if needed  Also, pt wanting Zofran ODT sent to pharmacy. This has been uploaded for your review

## 2018-04-16 NOTE — Telephone Encounter (Signed)
Spoke with Dr Madilyn Fireman- ok to send RX for Zofran and ok to have pt increase omeprazole to 1 capsule in AM and 1 in PM to see if SX resolve. Pt to call back next week with update

## 2018-04-16 NOTE — Telephone Encounter (Signed)
Rx sent for Zofran

## 2018-04-16 NOTE — Telephone Encounter (Signed)
Her labs overall were very reassuring.  At this point I recommend that we try a proton pump inhibitor for her nausea to see if some of this could be acid related.  Recommend a trial of omeprazole 20 mg taken at bedtime.  She could start it this evening and try this through the weekend and see if by Monday or Tuesday she is actually feeling better.  If it does seem to help then she can continue the medication for about a month and if at that point she still doing well then okay to discontinue it.  If it is not helping then my next step would probably be to refer her to GI.  An antibiotic will not really treat upset stomach or nausea.  And also send over some nausea medicine like Zofran or Phenergan if she has a preference for her to just use as needed.

## 2018-04-19 NOTE — Telephone Encounter (Signed)
Phyllicia called this morning. She tried to get her zanax refilled. Pharmacy told her she needed to make an appointment. She states that she has been here twice in the last month. She is going out of town tomorrow morning and needs to get this refill before then. She is completely out. It is the pharmacy on file.

## 2018-04-20 ENCOUNTER — Other Ambulatory Visit: Payer: Self-pay

## 2018-04-20 DIAGNOSIS — Z79899 Other long term (current) drug therapy: Secondary | ICD-10-CM

## 2018-04-20 DIAGNOSIS — F419 Anxiety disorder, unspecified: Secondary | ICD-10-CM

## 2018-04-20 DIAGNOSIS — F5105 Insomnia due to other mental disorder: Principal | ICD-10-CM

## 2018-04-20 MED ORDER — ALPRAZOLAM 0.5 MG PO TABS: 0.2500 mg | ORAL_TABLET | Freq: Every evening | ORAL | 2 refills | Status: DC | PRN

## 2018-07-13 ENCOUNTER — Encounter: Payer: Self-pay | Admitting: Physician Assistant

## 2018-07-13 ENCOUNTER — Ambulatory Visit (INDEPENDENT_AMBULATORY_CARE_PROVIDER_SITE_OTHER): Payer: BLUE CROSS/BLUE SHIELD | Admitting: Physician Assistant

## 2018-07-13 ENCOUNTER — Other Ambulatory Visit: Payer: Self-pay | Admitting: Physician Assistant

## 2018-07-13 VITALS — BP 149/77 | HR 87 | Temp 98.3°F | Wt 167.0 lb

## 2018-07-13 DIAGNOSIS — I10 Essential (primary) hypertension: Secondary | ICD-10-CM

## 2018-07-13 DIAGNOSIS — R6889 Other general symptoms and signs: Secondary | ICD-10-CM | POA: Diagnosis not present

## 2018-07-13 DIAGNOSIS — R41840 Attention and concentration deficit: Secondary | ICD-10-CM | POA: Diagnosis not present

## 2018-07-13 LAB — POCT INFLUENZA A/B
Influenza A, POC: NEGATIVE
Influenza B, POC: NEGATIVE

## 2018-07-13 MED ORDER — IPRATROPIUM BROMIDE 0.06 % NA SOLN: 2.0000 | Freq: Four times a day (QID) | NASAL | 0 refills | Status: DC | PRN

## 2018-07-13 MED ORDER — BENZONATATE 200 MG PO CAPS: 200.0000 mg | ORAL_CAPSULE | Freq: Three times a day (TID) | ORAL | 0 refills | Status: DC | PRN

## 2018-07-13 MED ORDER — GUAIFENESIN ER 600 MG PO TB12: 1200.0000 mg | ORAL_TABLET | Freq: Two times a day (BID) | ORAL | Status: DC

## 2018-07-13 MED ORDER — OSELTAMIVIR PHOSPHATE 75 MG PO CAPS: 75.0000 mg | ORAL_CAPSULE | Freq: Two times a day (BID) | ORAL | 0 refills | Status: AC

## 2018-07-13 NOTE — Progress Notes (Signed)
HPI:                                                                Emily Ponce is a 53 y.o. female who presents to [facility]: [place] today for flu-like symptoms  Influenza  This is a new problem. The current episode started yesterday. The problem occurs constantly. The problem has been gradually worsening. Associated symptoms include chills, coughing, fatigue and headaches. Pertinent negatives include no abdominal pain, change in bowel habit or chest pain. Nothing aggravates the symptoms. She has tried rest (Dayquil) for the symptoms. The treatment provided no relief.   She is also concerned about inattention.  She reports she has a Psychologist, educational at work who has been giving her a lot of negative feedback.  Her husband also reports that she tends to interrupt him and change subjects in conversation frequently.  She has no prior diagnosis of ADHD, and reports she thoughts 3 days throughout college.  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  Denies depressed mood or anhedonia.    Adult ADHD Self Report Scale (most recent)    Adult ADHD Self-Report Scale (ASRS-v1.1) Symptom Checklist - 07/13/18 1049      Part A   1. How often do you have trouble wrapping up the final details of a project, once the challenging parts have been done?  (!) Sometimes  2. How often do you have difficulty getting things done in order when you have to do a task that requires organization?  (!) Sometimes    3. How often do you have problems remembering appointments or obligations?  Never  4. When you have a task that requires a lot of thought, how often do you avoid or delay getting started?  Never    5. How often do you fidget or squirm with your hands or feet when you have to sit down for a long time?  (!) Often  6. How often do you feel overly active and compelled to do things, like you were  driven by a motor?  Sometimes      Part B   7. How often do you make careless mistakes when you have to work on a boring or difficult project?  Sometimes  8. How often do you have difficulty keeping your attention when you are doing boring or repetitive work?  (!) Often    9. How often do you have difficulty concentrating on what people say to you, even when they are speaking to you directly?  (!) Sometimes  10. How often do you misplace or have difficulty finding things at home or at work?  Sometimes    11. How often are you distracted by activity or noise around you?  (!) Often  12. How often do you leave your seat in meetings or other situations in which you are expected to remain seated?  Never    13. How often do you feel restless or fidgety?  Sometimes  14. How often do you have difficulty unwinding and relaxing when you have time to yourself?  Sometimes    15. How often do you find yourself talking too much  when you are in social situations?  Sometimes  16. When you are in a conversation, how often do you find yourself finishing the sentences of the people you are talking to, before they can finish them themselves?  (!) Often    17. How often do you have difficulty waiting your turn in situations when turn taking is required?  Rarely  18. How often do you interrupt others when they are busy?  (!) Sometimes      Comment   How old were you when these problems first began to occur?  48        Depression screen [facility] 2/9 07/13/2018 04/13/2018 12/07/2016  Decreased Interest 0 0 0  Down, Depressed, Hopeless 0 0 0  PHQ - 2 Score 0 0 0   GAD 7 : Generalized Anxiety Score 07/13/2018 12/07/2016  Nervous, Anxious, on Edge 0 0  Control/stop worrying 1 0  Worry too much - different things 1 0  Trouble relaxing 0 0  Restless 0 0  Easily annoyed or irritable 0 0  Afraid - awful might happen 0 0  Total GAD 7 Score 2 0  Anxiety Difficulty Somewhat difficult -     Past Medical History:  Diagnosis Date  .  Anxiety   . Broken neck ([place])    c-7  . Hypertension   . Ovarian cyst    Past Surgical History:  Procedure Laterality Date  . CARPAL TUNNEL RELEASE Left   . esure     . TUBAL LIGATION     Social History   Tobacco Use  . Smoking status: Former Smoker    Last attempt to quit: 04/24/2010    Years since quitting: 8.2  . Smokeless tobacco: Never Used  Substance Use Topics  . Alcohol use: Yes    Comment: occasional   family history includes Alzheimer's disease in her father; Cancer in her sister; Heart attack in her mother; Hypertension in her father and mother.    ROS: negative except as noted in the HPI  Medications: Current Outpatient Medications  Medication Sig Dispense Refill  . ALPRAZolam (XANAX) 0.5 MG tablet Take 0.5-1 tablets (0.25-0.5 mg total) by mouth at bedtime as needed for anxiety. 30 tablet 2  . atenolol (TENORMIN) 50 MG tablet TAKE 1 TABLET(50 MG) BY MOUTH DAILY 90 tablet 0  . benzonatate (TESSALON) 200 MG capsule Take 1 capsule (200 mg total) by mouth 3 (three) times daily as needed for cough. 30 capsule 0  . Black Cohosh 160 MG CAPS Take by mouth.    . Calcium Carbonate-Vitamin D 600-400 MG-UNIT tablet Take 1 tablet by mouth 2 (two) times daily. 60 tablet 11  . Flaxseed, Linseed, (FLAX SEEDS) POWD Take 1 tablet by mouth.    . folic acid (FOLVITE) 814 MCG tablet Take 400 mcg by mouth daily.    Marland Kitchen guaiFENesin (MUCINEX) 600 MG 12 hr tablet Take 2 tablets (1,200 mg total) by mouth 2 (two) times daily.    . hydrochlorothiazide (HYDRODIURIL) 25 MG tablet TAKE 1 TABLET(25 MG) BY MOUTH DAILY 90 tablet 0  . ipratropium (ATROVENT) 0.06 % nasal spray Place 2 sprays into both nostrils 4 (four) times daily as needed for rhinitis. 15 mL 0  . Omega-3 1000 MG CAPS Take 1 g by mouth.    . oseltamivir (TAMIFLU) 75 MG capsule Take 1 capsule (75 mg total) by mouth 2 (two) times daily for 5 days. 10 capsule 0  . Thiamine HCl (VITAMIN B-1) 250 MG tablet Take  250 mg by mouth daily.      No current facility-administered medications for this visit.    Allergies  Allergen Reactions  . Chantix [Varenicline]     Dreams       Objective:  BP (!) 149/77   Pulse 87   Temp 98.3 F (36.8 C) (Oral)   Wt 167 lb (75.8 kg)   LMP  (LMP Unknown) Comment: LMP 2017  SpO2 97%   BMI 27.79 kg/m  Gen:  alert,  ill-appearing, not toxic appearing, no distress, appropriate for age 69: head normocephalic without obvious abnormality, conjunctiva and cornea clear, oropharynx clear, uvula midline, moist mucous membranes, neck supple, no cervical adenopathy trachea midline Pulm: Normal work of breathing, normal phonation, slightly coarse in the left upper lung fields CV: tachycardic rate, regular rhythm, s1 and s2 distinct, no murmurs, clicks or rubs  Neuro: alert and oriented x 3, no tremor MSK: extremities atraumatic, normal gait and station Skin: intact, no rashes on exposed skin, no jaundice, no cyanosis   Results for orders placed or performed in visit on 07/13/18 (from the past 72 hour(s))  POCT Influenza A/B     Status: Normal   Collection Time: 07/13/18  9:08 AM  Result Value Ref Range   Influenza A, POC Negative Negative   Influenza B, POC Negative Negative   No results found.    Assessment and Plan: 53 y.o. female with   .Emily Ponce was seen today for uri.  Diagnoses and all orders for this visit:  Inattention -     Ambulatory referral to Psychology  Flu-like symptoms -     POCT Influenza A/B -     oseltamivir (TAMIFLU) 75 MG capsule; Take 1 capsule (75 mg total) by mouth 2 (two) times daily for 5 days. -     ipratropium (ATROVENT) 0.06 % nasal spray; Place 2 sprays into both nostrils 4 (four) times daily as needed for rhinitis. -     benzonatate (TESSALON) 200 MG capsule; Take 1 capsule (200 mg total) by mouth 3 (three) times daily as needed for cough. -     guaiFENesin (MUCINEX) 600 MG 12 hr tablet; Take 2 tablets (1,200 mg total) by mouth 2 (two) times  daily.     Afebrile, no tachypnea, no tachycardia, pulse ox 97% on room air Point-of-care influenza AB negative We discussed the chance of a false negative and she is within the window for Tamiflu so we will start this Counseled on supportive care for viral respiratory illness Work note provided for today and tomorrow, counseled not to return to work if she has any fever tomorrow  Inattention Adult self-report scale is negative, symptom onset is late in life after ae 48 PHQ9 negative GAD7-2 I discussed with patient the possibility that her long-term therapeutic use of alprazolam may be contributing to her cognitive symptoms.  If her ADHD testing is negative I would recommend we taper her off of alprazolam.  She may want to consider doing this anyways and I encouraged her to consider it.  Patient education and anticipatory guidance given Patient agrees with treatment plan Follow-up as needed if symptoms worsen or fail to improve  I spent 25 minutes with this patient, greater than 50% was face-to-face time counseling regarding the above diagnoses  Darlyne Russian PA-C

## 2018-07-13 NOTE — Patient Instructions (Addendum)
Long-term use of benzodiazepines including Xanax/alprazolam have been associated with cognitive changes including short-term memory difficulty, processing speed, and verbal learning ability. You may want to consider tapering off of this medication. I have placed a referral for ADHD testing. Someone from [facility] should be contacting you. Please let us know if you don't receive a call within 3 business days  Long-Term Effects on the Brain Coupled with the potential for becoming psychologically and physically dependent on them, benzodiazepines also may interfere with cognition and memory when used regularly for an extended amount of time. Benzodiazepines are thought to particularly interfere with visuospatial abilities, processing speed, and verbal learning abilities, as published by the Journal of Clinical Psychiatry.  Visuospatial refers to the way an individual sees, processes, replicates, and understands where objects are in relation to other things. Processing speed is the way in which simple tasks can be completed automatically after learning them, and verbal learning skills are related to speech and language.  Fortunately, many of the changes made by benzodiazepines to the different regions of the brain after prolonged use may be reversed after being free from these drugs for an extended period of time.   For nasal symptoms/sinusitis: - nasal saline rinses / netti pot several times per day (do this prior to nasal spray) - prescription Atrovent nasal spray: 2 sprays each nostril, up to 4 times per day as needed - you can use OTC nasal decongestant sprays like Afrin (oxymetazoline) BUT do not use for more than 3 days as it will cause worsening congestion/nasal symptoms) - warm facial compresses - oral decongestants and antihistamines like Claritin-D and Zyrtec-D may help dry up secretions (caution using decongestants if you have high blood pressure, heart disease or kidney  disease) - for sinus headache: Tylenol 1000mg  every 8 hours as needed. Alternate with Ibuprofen 600mg  every 6 hours  For sore throat: - Tylenol 1000mg  every 8 hours as needed for throat pain. Alternate with Ibuprofen 600mg  every 6 hours - Cepacol throat lozenges and/or Chloraseptic spray - Warm salt water gargles  For cough: - Cough is a protective mechanism and an important part of fighting an infection. I encourage you to avoid suppressing your cough with medication during the day if possible.  - Mucinex with at least 8 oz. of water can loosen chest congestion and make cough more productive, which means you will actually cough less - Okay to use a cough suppressant at bedtime in order to rest (Nyquil, Delsym, Robitussin, etc.)  Note: follow package instructions for all over-the-counter medications. If using multi-symptom medications (Dayquil, Theraflu, etc.), check the label for duplicate drug ingredients.  Living With Attention Deficit Hyperactivity Disorder If you have been diagnosed with attention deficit hyperactivity disorder (ADHD), you may be relieved that you now know why you have felt or behaved a certain way. Still, you may feel overwhelmed about the treatment ahead. You may also wonder how to get the support you need and how to deal with the condition day-to-day. With treatment and support, you can live with ADHD and manage your symptoms. How to manage lifestyle changes Managing stress Stress is your body's reaction to life changes and events, both good and bad. To cope with the stress of an ADHD diagnosis, it may help to:  Learn more about ADHD.  Exercise regularly. Even a short daily walk can lower stress levels.  Participate in training or education programs (including social skills training classes) that teach you to deal with symptoms.  Medicines Your  health care provider may suggest certain medicines if he or she feels that they will help to improve your condition.  Stimulant medicines are usually prescribed to treat ADHD, and therapy may also be prescribed. It is important to:  Avoid using alcohol and other substances that may prevent your medicines from working properly [facility]).  Talk with your pharmacist or health care provider about all the medicines that you take, their possible side effects, and what medicines are safe to take together.  Make it your goal to take part in all treatment decisions (shared decision-making). Ask about possible side effects of medicines that your health care provider recommends, and tell him or her how you feel about having those side effects. It is best if shared decision-making with your health care provider is part of your total treatment plan. Relationships To strengthen your relationships with family members while treating your condition, consider taking part in family therapy. You might also attend self-help groups alone or with a loved one. Be honest about how your symptoms affect your relationships. Make an effort to communicate respectfully instead of fighting, and find ways to show others that you care. Psychotherapy may be useful in helping you cope with how ADHD affects your relationships. How to recognize changes in your condition The following signs may mean that your treatment is working well and your condition is improving:  Consistently being on time for appointments.  Being more organized at home and work.  Other people noticing improvements in your behavior.  Achieving goals that you set for yourself.  Thinking more clearly. The following signs may mean that your treatment is not working very well:  Feeling impatience or more confusion.  Missing, forgetting, or being late for appointments.  An increasing sense of disorganization and messiness.  More difficulty in reaching goals that you set for yourself.  Loved ones becoming angry or frustrated with you. Where to find support Talking  to others  Keep emotion out of important discussions and speak in a calm, logical way.  Listen closely and patiently to your loved ones. Try to understand their point of view, and try to avoid getting defensive.  Take responsibility for the consequences of your actions.  Ask that others do not take your behaviors personally.  Aim to solve problems as they come up, and express your feelings instead of bottling them up.  Talk openly about what you need from your loved ones and how they can support you.  Consider going to family therapy sessions or having your family meet with a specialist who deals with ADHD-related behavior problems. Finances Not all insurance plans cover mental health care, so it is important to check with your insurance carrier. If paying for co-pays or counseling services is a problem, search for a local or county mental health care center. Public mental health care services may be offered there at a low cost or no cost when you are not able to see a private health care provider. If you are taking medicine for ADHD, you may be able to get the generic form, which may be less expensive than brand-name medicine. Some makers of prescription medicines also offer help to patients who cannot afford the medicines that they need. Follow these instructions at home:  Take over-the-counter and prescription medicines only as told by your health care provider. Check with your health care provider before taking any new medicines.  Create structure and an organized atmosphere at home. For example: ? Make a list of tasks,  then rank them from most important to least important. Work on one task at a time until your listed tasks are done. ? Make a daily schedule and follow it consistently every day. ? Use an appointment calendar, and check it 2 or 3 times a day to keep on track. Keep it with you when you leave the house. ? Create spaces where you keep certain things, and always put things  back in their places after you use them.  Keep all follow-up visits as told by your health care provider. This is important. Questions to ask your health care provider:  What are the risks and benefits of taking medicines?  Would I benefit from therapy?  How often should I follow up with a health care provider? Contact a health care provider if:  You have side effects from your medicines, such as: ? Repeated muscle twitches, coughing, or speech outbursts. ? Sleep problems. ? Loss of appetite. ? Depression. ? New or worsening behavior problems. ? Dizziness. ? Unusually fast heartbeat. ? Stomach pains. ? Headaches. Get help right away if:  You have a severe reaction to a medicine.  Your behavior suddenly gets worse. Summary  With treatment and support, you can live with ADHD and manage your symptoms.  The medicines that are most often prescribed for ADHD are stimulants.  Consider taking part in family therapy or self-help groups with family members or friends.  When you talk with friends and family about your ADHD, be patient and communicate openly.  Take over-the-counter and prescription medicines only as told by your health care provider. Check with your health care provider before taking any new medicines. This information is not intended to replace advice given to you by your health care provider. Make sure you discuss any questions you have with your health care provider. Document Released: 10/23/2016 Document Revised: 10/23/2016 Document Reviewed: 10/23/2016 Elsevier Interactive Patient Education  2019 [employer].

## 2018-07-17 ENCOUNTER — Other Ambulatory Visit: Payer: Self-pay | Admitting: Physician Assistant

## 2018-07-17 DIAGNOSIS — I10 Essential (primary) hypertension: Secondary | ICD-10-CM

## 2018-07-20 ENCOUNTER — Ambulatory Visit (INDEPENDENT_AMBULATORY_CARE_PROVIDER_SITE_OTHER): Payer: BLUE CROSS/BLUE SHIELD | Admitting: Psychology

## 2018-07-20 DIAGNOSIS — F3342 Major depressive disorder, recurrent, in full remission: Secondary | ICD-10-CM

## 2018-07-20 DIAGNOSIS — F909 Attention-deficit hyperactivity disorder, unspecified type: Secondary | ICD-10-CM

## 2018-08-06 ENCOUNTER — Ambulatory Visit (INDEPENDENT_AMBULATORY_CARE_PROVIDER_SITE_OTHER): Payer: BLUE CROSS/BLUE SHIELD | Admitting: Psychology

## 2018-08-06 DIAGNOSIS — F418 Other specified anxiety disorders: Secondary | ICD-10-CM | POA: Diagnosis not present

## 2018-08-06 DIAGNOSIS — F901 Attention-deficit hyperactivity disorder, predominantly hyperactive type: Secondary | ICD-10-CM

## 2018-08-06 DIAGNOSIS — F3342 Major depressive disorder, recurrent, in full remission: Secondary | ICD-10-CM

## 2018-08-10 ENCOUNTER — Encounter: Payer: Self-pay | Admitting: Physician Assistant

## 2018-08-10 ENCOUNTER — Ambulatory Visit (INDEPENDENT_AMBULATORY_CARE_PROVIDER_SITE_OTHER): Payer: BLUE CROSS/BLUE SHIELD | Admitting: Physician Assistant

## 2018-08-10 VITALS — BP 144/81 | HR 75 | Wt 168.0 lb

## 2018-08-10 DIAGNOSIS — F909 Attention-deficit hyperactivity disorder, unspecified type: Secondary | ICD-10-CM

## 2018-08-10 DIAGNOSIS — I10 Essential (primary) hypertension: Secondary | ICD-10-CM | POA: Insufficient documentation

## 2018-08-10 MED ORDER — AMPHETAMINE-DEXTROAMPHETAMINE 10 MG PO TABS: 10.0000 mg | ORAL_TABLET | Freq: Two times a day (BID) | ORAL | 0 refills | Status: DC

## 2018-08-10 NOTE — Patient Instructions (Addendum)
Monitor and log BP and HR at home while taking this medication If you experience dizziness, lightheadedness, irregular heart beat, palpitations stop the medication and contact our office  What is stimulant therapy? Stimulant therapy is the most commonly used treatment for Attention-Deficit Disorder/ Hyperactivity Disorder, also known as ADHD.  Stimulants are an effective way of managing ADHD symptoms such as short attention span, impulsive behavior, and hyperactivity. They may be used alone or in combination with behavior therapy.  These drugs improve ADHD symptoms in about 70% of adults and 70% to 80% of children shortly after starting treatment. Improvements include reduced interrupting, fidgeting, and other hyperactive symptoms, as well as improved task completion and home relationships.  Improvements in behavior and attention span usually continue as long as the medication is taken, although benefits in social adjustment and school performance have not yet been shown to endure over the long term.  These medications are not considered to be habit-forming when used to treat ADHD in children and adolescents, and there is no evidence that their use leads to drug abuse. Nonetheless, there is a potential for abuse and addiction with any stimulant medication, especially if a person has a history of substance abuse. Recent research, nevertheless, shows that individuals with ADHD had a lower incidence of substance use disorder if they were medically treated than if they were not treated.  Common stimulants for the treatment of ADHD There are many stimulants available: short acting (immediate-release), intermediate-acting, and long-acting forms. Common stimulants include:  Adderall (intermediate-acting) Adderall XR (long-acting) Concerta (long-acting) Daytrana (long-acting patch) Dexedrine (short-acting) Dexedrine Spansule (intermediate-acting) Focalin (short-acting) Focalin XR  (long-acting) Metadate CD (long-acting) Metadate ER (intermediate-acting) Methylin ER (intermediate-acting) Ritalin (short-acting) Ritalin LA (long-acting) Ritalin SR (intermediate-acting) Vyvanse (long-acting) The short-acting forms of the drug are usually taken 2 or 3 times a day and the long-acting ones just once a day.  Newer forms of some stimulant drugs may reduce side effects and relieve symptoms for a longer period of time. They include Concerta (10 to 12 hour duration), Ritalin LA (6 to 8 hours), Metadate CD (6 to 8 hours), and Adderall XR (10 to 12 hours), Vyvanse (up to 13 hours), Focalin XR (12 hours) and Daytrana (10 to12 hours).  Who should not take a stimulant drug? Patients with any of the following conditions or drug treatments should not take stimulant therapy: Allergy or sensitivity to stimulant medications. Patients with glaucoma (a condition that causes increased pressure in the eyes and can lead to blindness). Patients with severe anxiety, tension, agitation, or nervousness. Patients undergoing treatment with a type of medication called monoamine oxidase inhibitors (MAOIs), such as Nardil (phenelzine) , Parnate (tranylcypromine), Marplan ( isocarboxazid), or Eldepryl (selegeline) within 14 days of starting stimulant therapy. Patients with motor tics or a personal or family history of Tourette's Syndrome. Patients who have current psychotic episodes or a personal history of psychosis. Patients with overactive thyroid. Patients with coronary artery disease. Patients with heart or blood vessel disease. Patients with uncontrolled high blood pressure. Patients with certain types of irregular heart beat. Patients with a history of alcohol or substance abuse. What are some common side effects of stimulant therapy? Headache. Upset stomach. Increased blood pressure. Dizziness. Dry mouth. These may resolve after a few weeks of treatment as the body adjusts to the  medication.  Other side effects may respond to a dosage adjustment or by changing to another type of stimulant. They include:  Decreased appetite. This affects about 80% of people who take stimulant  therapy. Weight loss. This is an issue that can often be managed by taking the medication after meals or adding protein shakes or snacks to the diet. Nervousness. Sleeplessness/Insomnia. Sudden, repetitive movement or sounds known as tics. Growth reduction has been observed in some children and adolescents who take stimulants, but it has not been shown to affect final height. Children and adolescents should be followed for weight loss and growth while taking stimulants.  Visual disturbances such as blurred vision may occur with stimulant therapy.  Stimulant therapy may cause hallucinations or unusual thoughts.  Serious heart or blood vessel problems may be related to stimulant use.  Allergic reactions, with skin rashes and other, more serious allergic symptoms, can occur with stimulants, so it is best to notify your doctor if any new or unusual symptoms occur.  This is not a complete list of side effects.  If you notice any side effects that you think may be caused by stimulant medication, please contact a healthcare professional.

## 2018-08-10 NOTE — Progress Notes (Signed)
HPI:                                                                Emily Ponce is a 53 y.o. female who presents to [facility] [place] today for follow-up after diagnostic testing  Recently completed psychological eval with Emily Ponce and diagnosed with ADHD,predominantly hyperactive-impulsive type.  Testing also revealed lower than normal working memory. She would like to discuss medication management today.  She has struggled with her performance at work. Emily Ponce has told her that she doesn't seem focused and that she interrupts colleagues during meetings. She is embarrassed by this. She also finds herself "dazing out" and having trouble comprehending details when when people are speaking to her.  She denies depressed mood or anhedonia.  She has successfully tapered off of her Xanax and has not taken this medication for several weeks and plans to remain off the medication if possible.   Depression screen [facility] 2/9 07/13/2018 04/13/2018 12/07/2016  Decreased Interest 0 0 0  Down, Depressed, Hopeless 0 0 0  PHQ - 2 Score 0 0 0    GAD 7 : Generalized Anxiety Score 07/13/2018 12/07/2016  Nervous, Anxious, on Edge 0 0  Control/stop worrying 1 0  Worry too much - different things 1 0  Trouble relaxing 0 0  Restless 0 0  Easily annoyed or irritable 0 0  Afraid - awful might happen 0 0  Total GAD 7 Score 2 0  Anxiety Difficulty Somewhat difficult -      Past Medical History:  Diagnosis Date  . Anxiety   . Broken neck ([place])    c-7  . Hypertension   . Ovarian cyst    Past Surgical History:  Procedure Laterality Date  . CARPAL TUNNEL RELEASE Left   . esure     . TUBAL LIGATION     Social History   Tobacco Use  . Smoking status: Former Smoker    Last attempt to quit: 04/24/2010    Years since quitting: 8.3  . Smokeless tobacco: Never Used  Substance Use Topics  . Alcohol use: Yes    Comment: occasional   family history includes  Alzheimer's disease in her father; Cancer in her sister; Heart attack in her mother; Hypertension in her father and mother.    ROS: negative except as noted in the HPI  Medications: Current Outpatient Medications  Medication Sig Dispense Refill  . ALPRAZolam (XANAX) 0.5 MG tablet Take 0.5-1 tablets (0.25-0.5 mg total) by mouth at bedtime as needed for anxiety. 30 tablet 2  . amphetamine-dextroamphetamine (ADDERALL) 10 MG tablet Take 1 tablet (10 mg total) by mouth 2 (two) times daily. 60 tablet 0  . atenolol (TENORMIN) 50 MG tablet TAKE 1 TABLET(50 MG) BY MOUTH DAILY 90 tablet 0  . Black Cohosh 160 MG CAPS Take by mouth.    . Calcium Carbonate-Vitamin D 600-400 MG-UNIT tablet Take 1 tablet by mouth 2 (two) times daily. 60 tablet 11  . Flaxseed, Linseed, (FLAX SEEDS) POWD Take 1 tablet by mouth.    . folic acid (FOLVITE) 702 MCG tablet Take 400 mcg by mouth daily.    . hydrochlorothiazide (HYDRODIURIL) 25 MG tablet TAKE 1 TABLET(25 MG) BY MOUTH DAILY 90 tablet 0  .  ipratropium (ATROVENT) 0.06 % nasal spray Place 2 sprays into both nostrils 4 (four) times daily as needed for rhinitis. 15 mL 0  . Omega-3 1000 MG CAPS Take 1 g by mouth.    . Thiamine HCl (VITAMIN B-1) 250 MG tablet Take 250 mg by mouth daily.     No current facility-administered medications for this visit.    Allergies  Allergen Reactions  . Chantix [Varenicline]     Dreams       Objective:  BP (!) 144/81   Pulse 75   Wt 168 lb (76.2 kg)   LMP  (LMP Unknown) Comment: LMP 2017  BMI 27.96 kg/m  Gen:  alert, not ill-appearing, no distress, appropriate for age 13: head normocephalic without obvious abnormality, conjunctiva and cornea clear, trachea midline Pulm: Normal work of breathing, normal phonation Neuro: alert and oriented x 3, no tremor MSK: extremities atraumatic, normal gait and station Skin: intact, no rashes on exposed skin, no jaundice, no cyanosis Psych: well-groomed, cooperative, good eye  contact, euthymic mood, affect mood-congruent, speech is articulate, and thought processes clear and goal-directed  BP Readings from Last 3 Encounters:  08/10/18 (!) 144/81  07/13/18 (!) 149/77  04/13/18 124/65    Lab Results  Component Value Date   CREATININE 0.96 10/02/2017   BUN 15 10/02/2017   NA 141 10/02/2017   K 3.8 10/02/2017   CL 99 10/02/2017   CO2 34 (H) 10/02/2017   Lab Results  Component Value Date   TSH 2.04 04/14/2018     No results found for this or any previous visit (from the past 72 hour(s)). No results found.    Assessment and Plan: 53 y.o. female with   .Diagnoses and all orders for this visit:  Adult ADHD -     amphetamine-dextroamphetamine (ADDERALL) 10 MG tablet; Take 1 tablet (10 mg total) by mouth 2 (two) times daily.  Hypertension goal BP (blood pressure) < 130/80 -     COMPLETE METABOLIC PANEL WITH GFR   Risks and benefits of stimulant medication discussed with patient.  Specifically discussed risk of hypertensive urgency/emergency, cardiac arrhythmia, palpitations.  Return precautions were discussed.  Starting low-dose Adderall 10 mg twice daily.  Patient will closely monitor blood pressure and heart rate at home. BP out of range in office today.  Continue to avoid benzodiazepines.  We discussed that if anxiety symptoms recur in the future we could use non-benzodiazepine medications or low-dose alprazolam as needed.  Patient education and anticipatory guidance given Patient agrees with treatment plan Follow-up in 1 month or sooner as needed if symptoms worsen or fail to improve  Darlyne Russian PA-C

## 2018-08-24 ENCOUNTER — Ambulatory Visit (INDEPENDENT_AMBULATORY_CARE_PROVIDER_SITE_OTHER): Payer: BLUE CROSS/BLUE SHIELD | Admitting: Sports Medicine

## 2018-08-24 ENCOUNTER — Encounter: Payer: Self-pay | Admitting: Physician Assistant

## 2018-08-24 DIAGNOSIS — M109 Gout, unspecified: Secondary | ICD-10-CM | POA: Diagnosis not present

## 2018-08-24 MED ORDER — HYDROCODONE-ACETAMINOPHEN 5-325 MG PO TABS: 1.0000 | ORAL_TABLET | Freq: Three times a day (TID) | ORAL | 0 refills | Status: DC | PRN

## 2018-08-24 NOTE — Assessment & Plan Note (Addendum)
Arthrocentesis, crystal analysis. Postop shoe. Return to see me in a month, we will check serum uric acid levels at that time.  No crystals seen (likely due to the dilution required to get a sufficient sample volume) though I still suspect this is gout considering presentation, is the foot any better?  We will still likely check serum uric acid levels at the followup visit.

## 2018-08-24 NOTE — Progress Notes (Addendum)
Subjective:    CC: Left foot pain  HPI: Emily Ponce is a pleasant 53 year old female, over the past several days she has had severe pain, swelling, and redness in her left great toe.  She does endorse eating a lot of tuna and having some beers beforehand.  No history of gout in the past.  Pain is localized without radiation.  No fevers, chills, no trauma.  I reviewed the past medical history, family history, social history, surgical history, and allergies today and no changes were needed.  Please see the problem list section below in epic for further details.  Past Medical History: Past Medical History:  Diagnosis Date  . Anxiety   . Broken neck ([place])    c-7  . Hypertension   . Ovarian cyst    Past Surgical History: Past Surgical History:  Procedure Laterality Date  . CARPAL TUNNEL RELEASE Left   . esure     . TUBAL LIGATION     Social History: Social History   Socioeconomic History  . Marital status: Married    Spouse name: Not on file  . Number of children: Not on file  . Years of education: Not on file  . Highest education level: Not on file  Occupational History  . Occupation: [employer]  Social Needs  . Financial resource strain: Not on file  . Food insecurity:    Worry: Not on file    Inability: Not on file  . Transportation needs:    Medical: Not on file    Non-medical: Not on file  Tobacco Use  . Smoking status: Former Smoker    Last attempt to quit: 04/24/2010    Years since quitting: 8.3  . Smokeless tobacco: Never Used  Substance and Sexual Activity  . Alcohol use: Yes    Comment: occasional  . Drug use: No  . Sexual activity: Yes    Partners: Male    Birth control/protection: Surgical    Comment: Essure  Lifestyle  . Physical activity:    Days per week: Not on file    Minutes per session: Not on file  . Stress: Not on file  Relationships  . Social connections:    Talks on phone: Not on file    Gets together: Not on file    Attends religious  service: Not on file    Active member of club or organization: Not on file    Attends meetings of clubs or organizations: Not on file    Relationship status: Not on file  Other Topics Concern  . Not on file  Social History Narrative  . Not on file   Family History: Family History  Problem Relation Age of Onset  . Heart attack Mother   . Hypertension Mother   . Alzheimer's disease Father   . Hypertension Father   . Cancer Sister        cervical   Allergies: Allergies  Allergen Reactions  . Chantix [Varenicline]     Dreams   Medications: See med rec.  Review of Systems: No fevers, chills, night sweats, weight loss, chest pain, or shortness of breath.   Objective:    General: Well Developed, well nourished, and in no acute distress.  Neuro: Alert and oriented x3, extra-ocular muscles intact, sensation grossly intact.  HEENT: Normocephalic, atraumatic, pupils equal round reactive to light, neck supple, no masses, no lymphadenopathy, thyroid nonpalpable.  Skin: Warm and dry, no rashes. Cardiac: Regular rate and rhythm, no murmurs rubs or gallops, no  lower extremity edema.  Respiratory: Clear to auscultation bilaterally. Not using accessory muscles, speaking in full sentences. Left foot: Erythematous, swollen, tender first MTP Range of motion is full in all directions. Strength is 5/5 in all directions. No hallux valgus. No pes cavus or pes planus. No abnormal callus noted. No pain over the navicular prominence, or base of fifth metatarsal. No tenderness to palpation of the calcaneal insertion of plantar fascia. No pain at the Achilles insertion. No pain over the calcaneal bursa. No pain of the retrocalcaneal bursa. No hallux rigidus or limitus. No tenderness palpation over interphalangeal joints. No pain with compression of the metatarsal heads. Neurovascularly intact distally.  Procedure: Real-time Ultrasound Guided aspiration/injection of left first MTP Device: GE  Logiq E  Verbal informed consent obtained.  Time-out conducted.  Noted no overlying erythema, induration, or other signs of local infection.  Skin prepped in a sterile fashion.  Local anesthesia: Topical Ethyl chloride.  With sterile technique and under real time ultrasound guidance: 22-gauge needle advanced into the MTP, noted significant synovitis and effusion, I was able to aspirate a scant amount of thick, cloudy fluid, syringe switched and 1/2 cc Kenalog 40, 1/2 cc lidocaine injected easily Completed without difficulty  Pain immediately resolved suggesting accurate placement of the medication.  Advised to call if fevers/chills, erythema, induration, drainage, or persistent bleeding.  Images permanently stored and available for review in the ultrasound unit.  Impression: Technically successful ultrasound guided injection.  Impression and Recommendations:    Podagra, left Arthrocentesis, crystal analysis. Postop shoe. Return to see me in a month, we will check serum uric acid levels at that time.  No crystals seen (likely due to the dilution required to get a sufficient sample volume) though I still suspect this is gout considering presentation, is the foot any better?  We will still likely check serum uric acid levels at the followup visit. ___________________________________________ Gwen Her. Dianah Field, M.D., ABFM., CAQSM. Primary Care and Sports Medicine [facility]  Adjunct Professor of [place] [employer] [facility] [employer]

## 2018-08-25 LAB — SYNOVIAL FLUID, CRYSTAL

## 2018-08-31 ENCOUNTER — Ambulatory Visit (INDEPENDENT_AMBULATORY_CARE_PROVIDER_SITE_OTHER): Payer: BLUE CROSS/BLUE SHIELD

## 2018-08-31 ENCOUNTER — Ambulatory Visit (INDEPENDENT_AMBULATORY_CARE_PROVIDER_SITE_OTHER): Payer: BLUE CROSS/BLUE SHIELD | Admitting: Sports Medicine

## 2018-08-31 ENCOUNTER — Encounter: Payer: Self-pay | Admitting: Sports Medicine

## 2018-08-31 DIAGNOSIS — M79672 Pain in left foot: Secondary | ICD-10-CM | POA: Diagnosis not present

## 2018-08-31 DIAGNOSIS — M109 Gout, unspecified: Secondary | ICD-10-CM

## 2018-08-31 DIAGNOSIS — M25572 Pain in left ankle and joints of left foot: Secondary | ICD-10-CM

## 2018-08-31 DIAGNOSIS — M7989 Other specified soft tissue disorders: Secondary | ICD-10-CM | POA: Diagnosis not present

## 2018-08-31 MED ORDER — INDOMETHACIN 50 MG PO CAPS: 50.0000 mg | ORAL_CAPSULE | Freq: Two times a day (BID) | ORAL | 0 refills | Status: DC

## 2018-08-31 MED ORDER — HYDROCODONE-ACETAMINOPHEN 10-325 MG PO TABS: 1.0000 | ORAL_TABLET | Freq: Three times a day (TID) | ORAL | 0 refills | Status: DC | PRN

## 2018-08-31 MED ORDER — COLCHICINE 0.6 MG PO TABS: 0.6000 mg | ORAL_TABLET | Freq: Two times a day (BID) | ORAL | 2 refills | Status: DC

## 2018-08-31 NOTE — Assessment & Plan Note (Addendum)
Arthrocentesis showed no crystals, but I think it is simply because of the required evaluation for an appropriate sample. I do still think this is gout. Adding uric acid levels today, CBC, CMP. Indomethacin, colchicine. If uric acid levels are elevated we will add allopurinol. Hydrocodone 10/325 for acute pain relief. She will go back into her postop shoe. X-rays today. Return to see me in a month.  Uric acid levels are elevated at 8.1. Adding allopurinol 300 mg daily, recheck in 1 month. Liver function tests are moderately elevated, this needs to be followed up with her PCP.

## 2018-08-31 NOTE — Progress Notes (Addendum)
Subjective:    CC: Follow-up foot pain  HPI: Emily Ponce is a pleasant 53 year old female, I saw her about a week ago for severe left first MTP redness, swelling and pain.  This resembled podagra, we did an arthrocentesis however I had a dilute the arthrocentesis sample to get enough volume.  Ultimately the crystal analysis was negative although this may be a false negative.  The injection provided a few days of relief but her pain has come back.  Not as severe but localized at the first MTP.  She did get good relief with her postop shoe.  I reviewed the past medical history, family history, social history, surgical history, and allergies today and no changes were needed.  Please see the problem list section below in epic for further details.  Past Medical History: Past Medical History:  Diagnosis Date  . Anxiety   . Broken neck ([place])    c-7  . Hypertension   . Ovarian cyst    Past Surgical History: Past Surgical History:  Procedure Laterality Date  . CARPAL TUNNEL RELEASE Left   . esure     . TUBAL LIGATION     Social History: Social History   Socioeconomic History  . Marital status: Married    Spouse name: Not on file  . Number of children: Not on file  . Years of education: Not on file  . Highest education level: Not on file  Occupational History  . Occupation: [employer]  Social Needs  . Financial resource strain: Not on file  . Food insecurity:    Worry: Not on file    Inability: Not on file  . Transportation needs:    Medical: Not on file    Non-medical: Not on file  Tobacco Use  . Smoking status: Former Smoker    Last attempt to quit: 04/24/2010    Years since quitting: 8.3  . Smokeless tobacco: Never Used  Substance and Sexual Activity  . Alcohol use: Yes    Comment: occasional  . Drug use: No  . Sexual activity: Yes    Partners: Male    Birth control/protection: Surgical    Comment: Essure  Lifestyle  . Physical activity:    Days per week: Not on file   Minutes per session: Not on file  . Stress: Not on file  Relationships  . Social connections:    Talks on phone: Not on file    Gets together: Not on file    Attends religious service: Not on file    Active member of club or organization: Not on file    Attends meetings of clubs or organizations: Not on file    Relationship status: Not on file  Other Topics Concern  . Not on file  Social History Narrative  . Not on file   Family History: Family History  Problem Relation Age of Onset  . Heart attack Mother   . Hypertension Mother   . Alzheimer's disease Father   . Hypertension Father   . Cancer Sister        cervical   Allergies: Allergies  Allergen Reactions  . Chantix [Varenicline]     Dreams   Medications: See med rec.  Review of Systems: No fevers, chills, night sweats, weight loss, chest pain, or shortness of breath.   Objective:    General: Well Developed, well nourished, and in no acute distress.  Neuro: Alert and oriented x3, extra-ocular muscles intact, sensation grossly intact.  HEENT: Normocephalic, atraumatic, pupils  equal round reactive to light, neck supple, no masses, no lymphadenopathy, thyroid nonpalpable.  Skin: Warm and dry, no rashes. Cardiac: Regular rate and rhythm, no murmurs rubs or gallops, no lower extremity edema.  Respiratory: Clear to auscultation bilaterally. Not using accessory muscles, speaking in full sentences. Left foot: Swollen, erythematous, tender first MTP.  Impression and Recommendations:    Gouty arthritis of left great toe Arthrocentesis showed no crystals, but I think it is simply because of the required evaluation for an appropriate sample. I do still think this is gout. Adding uric acid levels today, CBC, CMP. Indomethacin, colchicine. If uric acid levels are elevated we will add allopurinol. Hydrocodone 10/325 for acute pain relief. She will go back into her postop shoe. X-rays today. Return to see me in a  month.  Uric acid levels are elevated at 8.1. Adding allopurinol 300 mg daily, recheck in 1 month. Liver function tests are moderately elevated, this needs to be followed up with her PCP. ___________________________________________ Gwen Her. Dianah Field, M.D., ABFM., CAQSM. Primary Care and Sports Medicine [facility]  Adjunct Professor of [place] [employer] [facility] [employer]

## 2018-09-01 LAB — COMPREHENSIVE METABOLIC PANEL
AG Ratio: 1.9 (calc) (ref 1.0–2.5)
ALT: 140 U/L — ABNORMAL HIGH (ref 6–29)
AST: 67 U/L — ABNORMAL HIGH (ref 10–35)
Alkaline phosphatase (APISO): 77 U/L (ref 37–153)
CO2: 34 mmol/L — ABNORMAL HIGH (ref 20–32)
Calcium: 10.1 mg/dL (ref 8.6–10.4)
Chloride: 94 mmol/L — ABNORMAL LOW (ref 98–110)
Creat: 0.9 mg/dL (ref 0.50–1.05)
Globulin: 2.5 g/dL (calc) (ref 1.9–3.7)
Glucose, Bld: 106 mg/dL — ABNORMAL HIGH (ref 65–99)
Potassium: 3.7 mmol/L (ref 3.5–5.3)
Sodium: 138 mmol/L (ref 135–146)
Total Bilirubin: 0.7 mg/dL (ref 0.2–1.2)
Total Protein: 7.3 g/dL (ref 6.1–8.1)

## 2018-09-01 LAB — CBC WITH DIFFERENTIAL/PLATELET
Absolute Monocytes: 602 {cells}/uL (ref 200–950)
Basophils Absolute: 66 cells/uL (ref 0–200)
Basophils Relative: 0.7 %
Eosinophils Absolute: 113 {cells}/uL (ref 15–500)
Eosinophils Relative: 1.2 %
HCT: 43 % (ref 35.0–45.0)
Hemoglobin: 15.4 g/dL (ref 11.7–15.5)
Lymphs Abs: 2482 cells/uL (ref 850–3900)
MCH: 32.1 pg (ref 27.0–33.0)
MCHC: 35.8 g/dL (ref 32.0–36.0)
MCV: 89.6 fL (ref 80.0–100.0)
MPV: 11.7 fL (ref 7.5–12.5)
Monocytes Relative: 6.4 %
Neutro Abs: 6138 cells/uL (ref 1500–7800)
Neutrophils Relative %: 65.3 %
Platelets: 271 10*3/uL (ref 140–400)
RBC: 4.8 10*6/uL (ref 3.80–5.10)
RDW: 12.8 % (ref 11.0–15.0)
Total Lymphocyte: 26.4 %
WBC: 9.4 10*3/uL (ref 3.8–10.8)

## 2018-09-01 LAB — COMPREHENSIVE METABOLIC PANEL WITH GFR
Albumin: 4.8 g/dL (ref 3.6–5.1)
BUN: 11 mg/dL (ref 7–25)

## 2018-09-01 LAB — URIC ACID: Uric Acid, Serum: 8.1 mg/dL — ABNORMAL HIGH (ref 2.5–7.0)

## 2018-09-01 MED ORDER — ALLOPURINOL 300 MG PO TABS: 300.0000 mg | ORAL_TABLET | Freq: Every day | ORAL | 3 refills | Status: DC

## 2018-09-01 NOTE — Addendum Note (Signed)
Addended by: Silverio Decamp on: 09/01/2018 08:29 AM   Modules accepted: Orders

## 2018-09-08 ENCOUNTER — Encounter: Payer: Self-pay | Admitting: Physician Assistant

## 2018-09-08 ENCOUNTER — Ambulatory Visit (INDEPENDENT_AMBULATORY_CARE_PROVIDER_SITE_OTHER): Payer: BLUE CROSS/BLUE SHIELD | Admitting: Physician Assistant

## 2018-09-08 VITALS — BP 118/73 | HR 63

## 2018-09-08 DIAGNOSIS — E782 Mixed hyperlipidemia: Secondary | ICD-10-CM | POA: Diagnosis not present

## 2018-09-08 DIAGNOSIS — R740 Nonspecific elevation of levels of transaminase and lactic acid dehydrogenase [LDH]: Secondary | ICD-10-CM | POA: Diagnosis not present

## 2018-09-08 DIAGNOSIS — R7401 Elevation of levels of liver transaminase levels: Secondary | ICD-10-CM | POA: Insufficient documentation

## 2018-09-08 DIAGNOSIS — F909 Attention-deficit hyperactivity disorder, unspecified type: Secondary | ICD-10-CM | POA: Diagnosis not present

## 2018-09-08 MED ORDER — AMPHETAMINE-DEXTROAMPHETAMINE 5 MG PO TABS: 5.0000 mg | ORAL_TABLET | Freq: Two times a day (BID) | ORAL | 0 refills | Status: DC

## 2018-09-08 MED ORDER — AMPHETAMINE-DEXTROAMPHETAMINE 10 MG PO TABS: 10.0000 mg | ORAL_TABLET | Freq: Two times a day (BID) | ORAL | 0 refills | Status: DC

## 2018-09-08 NOTE — Patient Instructions (Signed)
Nonalcoholic Fatty Liver Disease Diet  Nonalcoholic fatty liver disease is a condition that causes fat to accumulate in and around the liver. The disease makes it harder for the liver to work the way that it should. Following a healthy diet can help to keep nonalcoholic fatty liver disease under control. It can also help to prevent or improve conditions that are associated with the disease, such as heart disease, diabetes, high blood pressure, and abnormal cholesterol levels. Along with regular exercise, this diet:   Promotes weight loss.   Helps to control blood sugar levels.   Helps to improve the way that the body uses insulin.  What do I need to know about this diet?   Use the glycemic index (GI) to plan your meals. The index tells you how quickly a food will raise your blood sugar. Choose low-GI foods. These foods take a longer time to raise blood sugar.   Keep track of how many calories you take in. Eating the right amount of calories will help you to achieve a healthy weight.   You may want to follow a Mediterranean diet. This diet includes a lot of vegetables, lean meats or fish, whole grains, fruits, and healthy oils and fats.  What foods can I eat?  Grains  Whole grains, such as whole-wheat or whole-grain breads, crackers, tortillas, cereals, and pasta. Stone-ground whole wheat. Pumpernickel bread. Unsweetened oatmeal. Bulgur. Barley. Quinoa. Brown or wild rice. Corn or whole-wheat flour tortillas.  Vegetables  Lettuce. Spinach. Peas. Beets. Cauliflower. Cabbage. Broccoli. Carrots. Tomatoes. Squash. Eggplant. Herbs. Peppers. Onions. Cucumbers. Brussels sprouts. Yams and sweet potatoes. Beans. Lentils.  Fruits  Bananas. Apples. Oranges. Grapes. Papaya. Mango. Pomegranate. Kiwi. Grapefruit. Cherries.  Meats and Other Protein Sources  Seafood and shellfish. Lean meats. Poultry. Tofu.  Dairy  Low-fat or fat-free dairy products, such as yogurt, cottage cheese, and cheese.  Beverages  Water. Sugar-free  drinks. Tea. Coffee. Low-fat or skim milk. Milk alternatives, such as soy or almond milk. Real fruit juice.  Condiments  Mustard. Relish. Low-fat, low-sugar ketchup and barbecue sauce. Low-fat or fat-free mayonnaise.  Sweets and Desserts  Sugar-free sweets.  Fats and Oils  Avocado. Canola or olive oil. Nuts and nut butters. Seeds.  The items listed above may not be a complete list of recommended foods or beverages. Contact your dietitian for more options.  What foods are not recommended?  Palm oil and coconut oil. Processed foods. Fried foods. Sweetened drinks, such as sweet tea, milkshakes, snow cones, iced sweet drinks, and sodas. Alcohol. Sweets. Foods that contain a lot of salt or sodium.  The items listed above may not be a complete list of foods and beverages to avoid. Contact your dietitian for more information.  This information is not intended to replace advice given to you by your health care provider. Make sure you discuss any questions you have with your health care provider.  Document Released: 11/07/2014 Document Revised: 11/29/2015 Document Reviewed: 07/18/2014  Elsevier Interactive Patient Education  2019 Elsevier Inc.

## 2018-09-08 NOTE — Progress Notes (Signed)
HPI:                                                                Emily Ponce is a 53 y.o. 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  Testing also revealed lower than normal working memory. She would like to discuss medication management today.  She has struggled with her performance at work. Emily Ponce has told her that she doesn't seem focused and that she interrupts colleagues during meetings. She is embarrassed by this. She also finds herself "dazing out" and having trouble comprehending details when when people are speaking to her.  She denies depressed mood or anhedonia.  She has successfully tapered off of her Xanax and has not taken this medication for several weeks and plans to remain off the medication if possible.  Interval hx 09/07/18 She was started on Adderall 10 mg bid approx 1 month ago Has been monitoring BP at home. Readings 120's/70's Has not noticed any improvement in her inattention or impulsivety. Has not received any feedback from husband or colleagues. Denies adverse effects of medication including palpitations, chest pain, headache or sleep disturbance    Past Medical History:  Diagnosis Date  . Anxiety   . Broken neck ([place])    c-7  . Hypertension   . Ovarian cyst    Past Surgical History:  Procedure Laterality Date  . CARPAL TUNNEL RELEASE Left   . esure     . TUBAL LIGATION     Social History   Tobacco Use  . Smoking status: Former Smoker    Last attempt to quit: 04/24/2010    Years since quitting: 8.3  . Smokeless tobacco: Never Used  Substance Use Topics  . Alcohol use: Yes    Comment: occasional   family history includes Alzheimer's disease in her father; Cancer in her sister; Heart attack in her mother; Hypertension in her father and  mother.    ROS: negative except as noted in the HPI  Medications: Current Outpatient Medications  Medication Sig Dispense Refill  . allopurinol (ZYLOPRIM) 300 MG tablet Take 1 tablet (300 mg total) by mouth daily. 30 tablet 3  . ALPRAZolam (XANAX) 0.5 MG tablet Take 0.5-1 tablets (0.25-0.5 mg total) by mouth at bedtime as needed for anxiety. 30 tablet 2  . amphetamine-dextroamphetamine (ADDERALL) 10 MG tablet Take 1 tablet (10 mg total) by mouth 2 (two) times daily with breakfast and lunch. Take in addition to 5 mg tab for TDD of 15 mg bid 60 tablet 0  . amphetamine-dextroamphetamine (ADDERALL) 5 MG tablet Take 1 tablet (5 mg total) by mouth 2 (two) times daily with breakfast and lunch. Take in addition to 10 mg tab for TDD of 15 mg bid 60 tablet 0  . atenolol (TENORMIN) 50 MG tablet TAKE 1 TABLET(50 MG) BY MOUTH DAILY 90 tablet 0  . Black Cohosh 160 MG CAPS Take by mouth.    . Calcium Carbonate-Vitamin D 600-400 MG-UNIT tablet Take 1 tablet by mouth 2 (two) times daily. 60 tablet 11  . colchicine 0.6 MG tablet Take 1 tablet (0.6 mg total) by mouth 2 (two) times daily.  60 tablet 2  . Flaxseed, Linseed, (FLAX SEEDS) POWD Take 1 tablet by mouth.    . folic acid (FOLVITE) 482 MCG tablet Take 400 mcg by mouth daily.    . hydrochlorothiazide (HYDRODIURIL) 25 MG tablet TAKE 1 TABLET(25 MG) BY MOUTH DAILY 90 tablet 0  . HYDROcodone-acetaminophen (NORCO) 10-325 MG tablet Take 1 tablet by mouth every 8 (eight) hours as needed. 30 tablet 0  . indomethacin (INDOCIN) 50 MG capsule Take 1 capsule (50 mg total) by mouth 2 (two) times daily with a meal. 60 capsule 0  . ipratropium (ATROVENT) 0.06 % nasal spray Place 2 sprays into both nostrils 4 (four) times daily as needed for rhinitis. 15 mL 0  . Omega-3 1000 MG CAPS Take 1 g by mouth.    . Thiamine HCl (VITAMIN B-1) 250 MG tablet Take 250 mg by mouth daily.     No current facility-administered medications for this visit.    Allergies  Allergen  Reactions  . Chantix [Varenicline]     Dreams       Objective:  BP 118/73   Pulse 63   LMP  (LMP Unknown) Comment: LMP 2017 Gen:  alert, not ill-appearing, no distress, appropriate for age 13: head normocephalic without obvious abnormality, conjunctiva and cornea clear, trachea midline Pulm: Normal work of breathing, normal phonation Neuro: alert and oriented x 3, no tremor MSK: extremities atraumatic, normal gait and station Skin: intact, no rashes on exposed skin, no jaundice, no cyanosis Psych: well-groomed, cooperative, good eye contact, euthymic mood, affect mood-congruent, speech is articulate, and thought processes clear and goal-directed  BP Readings from Last 3 Encounters:  09/08/18 118/73  08/31/18 117/68  08/24/18 134/79    Lab Results  Component Value Date   CREATININE 0.90 08/31/2018   BUN 11 08/31/2018   NA 138 08/31/2018   K 3.7 08/31/2018   CL 94 (L) 08/31/2018   CO2 34 (H) 08/31/2018   Lab Results  Component Value Date   TSH 2.04 04/14/2018     No results found for this or any previous visit (from the past 72 hour(s)). No results found.    Assessment and Plan: 53 y.o. female with   .Emily Ponce was seen today for follow-up.  Diagnoses and all orders for this visit:  Adult ADHD -     amphetamine-dextroamphetamine (ADDERALL) 10 MG tablet; Take 1 tablet (10 mg total) by mouth 2 (two) times daily with breakfast and lunch. Take in addition to 5 mg tab for TDD of 15 mg bid -     amphetamine-dextroamphetamine (ADDERALL) 5 MG tablet; Take 1 tablet (5 mg total) by mouth 2 (two) times daily with breakfast and lunch. Take in addition to 10 mg tab for TDD of 15 mg bid  Mixed hyperlipidemia -     Lipid Panel w/reflex Direct LDL  Transaminitis -     Hepatic function panel   Adult ADHD No response to Adderall 10 mg bid Increasing dose gradually beginning with 15 mg bid Home BP readings in range. Cont to monitor  Patient education and anticipatory  guidance given Patient agrees with treatment plan Follow-up in 1 month or sooner as needed if symptoms worsen or fail to improve  Darlyne Russian PA-C

## 2018-09-14 ENCOUNTER — Encounter: Payer: Self-pay | Admitting: Physician Assistant

## 2018-09-15 ENCOUNTER — Other Ambulatory Visit: Payer: Self-pay

## 2018-09-15 ENCOUNTER — Ambulatory Visit (INDEPENDENT_AMBULATORY_CARE_PROVIDER_SITE_OTHER): Payer: BLUE CROSS/BLUE SHIELD | Admitting: Physician Assistant

## 2018-09-15 ENCOUNTER — Encounter: Payer: Self-pay | Admitting: Physician Assistant

## 2018-09-15 VITALS — BP 129/62 | HR 82 | Temp 99.2°F | Wt 160.0 lb

## 2018-09-15 DIAGNOSIS — R740 Nonspecific elevation of levels of transaminase and lactic acid dehydrogenase [LDH]: Secondary | ICD-10-CM | POA: Diagnosis not present

## 2018-09-15 DIAGNOSIS — R6889 Other general symptoms and signs: Secondary | ICD-10-CM | POA: Diagnosis not present

## 2018-09-15 DIAGNOSIS — R110 Nausea: Secondary | ICD-10-CM

## 2018-09-15 DIAGNOSIS — J029 Acute pharyngitis, unspecified: Secondary | ICD-10-CM

## 2018-09-15 DIAGNOSIS — R059 Cough, unspecified: Secondary | ICD-10-CM

## 2018-09-15 DIAGNOSIS — R509 Fever, unspecified: Secondary | ICD-10-CM | POA: Diagnosis not present

## 2018-09-15 DIAGNOSIS — R05 Cough: Secondary | ICD-10-CM | POA: Diagnosis not present

## 2018-09-15 DIAGNOSIS — E782 Mixed hyperlipidemia: Secondary | ICD-10-CM | POA: Diagnosis not present

## 2018-09-15 LAB — POCT INFLUENZA A/B
Influenza A, POC: NEGATIVE
Influenza B, POC: NEGATIVE

## 2018-09-15 LAB — POCT RAPID STREP A (OFFICE): Rapid Strep A Screen: NEGATIVE

## 2018-09-15 MED ORDER — BENZONATATE 200 MG PO CAPS: 200.0000 mg | ORAL_CAPSULE | Freq: Two times a day (BID) | ORAL | 0 refills | Status: DC | PRN

## 2018-09-15 MED ORDER — HYDROCOD POLST-CPM POLST ER 10-8 MG/5ML PO SUER: 5.0000 mL | Freq: Two times a day (BID) | ORAL | 0 refills | Status: DC | PRN

## 2018-09-15 MED ORDER — ONDANSETRON 8 MG PO TBDP: 8.0000 mg | ORAL_TABLET | Freq: Three times a day (TID) | ORAL | 3 refills | Status: DC | PRN

## 2018-09-15 MED ORDER — OSELTAMIVIR PHOSPHATE 75 MG PO CAPS: 75.0000 mg | ORAL_CAPSULE | Freq: Two times a day (BID) | ORAL | 0 refills | Status: DC

## 2018-09-15 NOTE — Patient Instructions (Signed)
Tylenol.

## 2018-09-15 NOTE — Progress Notes (Signed)
Subjective:    Patient ID: Emily Ponce, female    DOB: [birth year], 53 y.o.   MRN: [id number]  HPI Pt is a 53 yo female who presents to the clinic with fever, chills, body aches, cough that started this morning. Yesterday she didn't feel good but this morning had symptoms. Not taken anything to make better. Her husband had flu like symptoms late last week but did not go to the doctor or get tested. No travel or contact with persons who have traveled. She does have some nausea but no diarrhea or vomiting. No abdominal pain or dysuria. No SOB or wheezing.   .. Active Ambulatory Problems    Diagnosis Date Noted  . 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  . Essential hypertension 04/24/2014  . Chronic headaches 04/24/2014  . Generalized anxiety disorder 04/24/2014  . Chronic neck pain 04/24/2014  . Hyperlipidemia 07/26/2014  . Right foot pain 11/17/2014  . Ovarian mass 01/05/2015  . Fibroid 01/05/2015  . Headache, migraine 07/17/2012  . Chronic cervical pain 01/20/2014  . Cervical radiculitis 10/30/2015  . Mastalgia 06/25/2016  . Tinea corporis 06/25/2016  . Perimenopausal vasomotor symptoms 06/25/2016  . 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 Allergic rhinitis due to allergen 12/22/2016  . Oral allergy syndrome 12/22/2016  . Hoarseness of voice 01/16/2017  . Menopausal vasomotor syndrome 10/02/2017  . Overweight (BMI 25.0-29.9) 10/02/2017  . Family history of heart attack 10/02/2017  . Precordial chest pain 10/02/2017  . Chronic use of benzodiazepine for therapeutic purpose 10/02/2017  . History of cervical fracture 10/02/2017  . Slow transit constipation 04/13/2018  . Left hip pain 04/13/2018  . Inattention 07/13/2018  . Adult ADHD 08/10/2018  . Hypertension goal BP (blood pressure) < 130/80 08/10/2018  . Gouty arthritis of left great toe 08/24/2018  . Transaminitis 09/08/2018   Resolved Ambulatory Problems    Diagnosis Date Noted  . Preventive measure 07/06/2014   . Aneurysm ([place]) 05/19/2011  . Menometrorrhagia 01/16/2015   Past Medical History:  Diagnosis Date  . Anxiety   . Broken neck ([place])   . Hypertension   . Ovarian cyst       Review of Systems See HPI>     Objective:   Physical Exam Vitals signs reviewed.  Constitutional:      Appearance: She is ill-appearing.  HENT:     Head: Normocephalic.     Right Ear: Tympanic membrane normal.     Left Ear: Tympanic membrane normal.     Nose: Congestion present.     Mouth/Throat:     Pharynx: Posterior oropharyngeal erythema present.     Comments: No exudate or tonsil hypertrophy.  Eyes:     Conjunctiva/sclera: Conjunctivae normal.  Cardiovascular:     Rate and Rhythm: Normal rate and regular rhythm.     Pulses: Normal pulses.  Pulmonary:     Effort: Pulmonary effort is normal.     Breath sounds: Normal breath sounds.  Neurological:     General: No focal deficit present.     Mental Status: She is oriented to person, place, and time.  Psychiatric:        Mood and Affect: Mood normal.           Assessment & Plan:  Marland KitchenMarland KitchenYolonda was seen today for fever.  Diagnoses and all orders for this visit:  Influenza-like symptoms -     oseltamivir (TAMIFLU) 75 MG capsule; Take 1 capsule (75 mg total) by mouth 2 (two) times daily.  Fever,  unspecified fever cause -     POCT Influenza A/B  Sore throat -     POCT rapid strep A  Cough -     benzonatate (TESSALON) 200 MG capsule; Take 1 capsule (200 mg total) by mouth 2 (two) times daily as needed for cough. -     chlorpheniramine-HYDROcodone ([place]) 10-8 MG/5ML SUER; Take 5 mLs by mouth every 12 (twelve) hours as needed.  Nausea -     ondansetron (ZOFRAN-ODT) 8 MG disintegrating tablet; Take 1 tablet (8 mg total) by mouth every 8 (eight) hours as needed for nausea.   .. Results for orders placed or performed in visit on 09/15/18  POCT Influenza A/B  Result Value Ref Range   Influenza A, POC Negative Negative   Influenza B,  POC Negative Negative  POCT rapid strep A  Result Value Ref Range   Rapid Strep A Screen Negative Negative   Flu and strep negative.  Discussed due to early detection and likely viral etiology. tamiflu could still help symptoms even if not the flu. Pt agreed to take.  zofran for nausea.  tussionex and tessalon for cough.  Consider tylenol cold sinus severe.  Rest and hydrate.  Wrote out of work until fever free for 24 hours.  She may worsen some but if not improving call office.

## 2018-09-16 LAB — LIPID PANEL W/REFLEX DIRECT LDL
Cholesterol: 190 mg/dL (ref <200)
HDL: 59 mg/dL (ref >=50)
LDL Cholesterol (Calc): 112 mg/dL (calc) — ABNORMAL HIGH
Non-HDL Cholesterol (Calc): 131 mg/dL (calc) — ABNORMAL HIGH (ref <130)
Total CHOL/HDL Ratio: 3.2 (calc) (ref <5.0)
Triglycerides: 90 mg/dL (ref <150)

## 2018-09-16 LAB — HEPATIC FUNCTION PANEL
AG Ratio: 2 (calc) (ref 1.0–2.5)
ALBUMIN MSPROF: 4.5 g/dL (ref 3.6–5.1)
ALT: 130 U/L — ABNORMAL HIGH (ref 6–29)
AST: 84 U/L — ABNORMAL HIGH (ref 10–35)
Alkaline phosphatase (APISO): 67 U/L (ref 37–153)
Bilirubin, Direct: 0.1 mg/dL (ref 0.0–0.2)
Globulin: 2.2 g/dL (calc) (ref 1.9–3.7)
Indirect Bilirubin: 0.3 mg/dL (calc) (ref 0.2–1.2)
Total Bilirubin: 0.4 mg/dL (ref 0.2–1.2)
Total Protein: 6.7 g/dL (ref 6.1–8.1)

## 2018-09-16 NOTE — Addendum Note (Signed)
Addended by: Nelson Chimes E on: 09/16/2018 02:13 PM   Modules accepted: Orders

## 2018-09-20 ENCOUNTER — Ambulatory Visit (INDEPENDENT_AMBULATORY_CARE_PROVIDER_SITE_OTHER): Payer: BLUE CROSS/BLUE SHIELD

## 2018-09-20 ENCOUNTER — Other Ambulatory Visit: Payer: Self-pay

## 2018-09-20 DIAGNOSIS — K7689 Other specified diseases of liver: Secondary | ICD-10-CM | POA: Diagnosis not present

## 2018-09-20 DIAGNOSIS — R740 Nonspecific elevation of levels of transaminase and lactic acid dehydrogenase [LDH]: Secondary | ICD-10-CM

## 2018-09-20 DIAGNOSIS — R7401 Elevation of levels of liver transaminase levels: Secondary | ICD-10-CM

## 2018-09-21 ENCOUNTER — Other Ambulatory Visit: Payer: Self-pay | Admitting: Physician Assistant

## 2018-09-21 ENCOUNTER — Ambulatory Visit: Payer: Self-pay | Admitting: Sports Medicine

## 2018-09-21 DIAGNOSIS — R7401 Elevation of levels of liver transaminase levels: Secondary | ICD-10-CM

## 2018-09-21 DIAGNOSIS — K760 Fatty (change of) liver, not elsewhere classified: Secondary | ICD-10-CM

## 2018-09-21 DIAGNOSIS — R740 Nonspecific elevation of levels of transaminase and lactic acid dehydrogenase [LDH]: Principal | ICD-10-CM

## 2018-09-23 ENCOUNTER — Encounter: Payer: Self-pay | Admitting: Gastroenterology

## 2018-10-04 ENCOUNTER — Encounter: Payer: Self-pay | Admitting: Physician Assistant

## 2018-10-06 ENCOUNTER — Ambulatory Visit: Payer: Self-pay | Admitting: Physician Assistant

## 2018-10-14 ENCOUNTER — Other Ambulatory Visit: Payer: Self-pay | Admitting: Physician Assistant

## 2018-10-14 DIAGNOSIS — I10 Essential (primary) hypertension: Secondary | ICD-10-CM

## 2018-10-22 ENCOUNTER — Ambulatory Visit: Payer: Self-pay | Admitting: Gastroenterology

## 2018-10-24 ENCOUNTER — Other Ambulatory Visit: Payer: Self-pay | Admitting: Physician Assistant

## 2018-10-24 DIAGNOSIS — I10 Essential (primary) hypertension: Secondary | ICD-10-CM

## 2018-10-27 ENCOUNTER — Other Ambulatory Visit: Payer: Self-pay

## 2018-10-27 DIAGNOSIS — I10 Essential (primary) hypertension: Secondary | ICD-10-CM

## 2018-10-27 MED ORDER — ATENOLOL 50 MG PO TABS: ORAL_TABLET | ORAL | 0 refills | Status: DC

## 2018-10-27 NOTE — Progress Notes (Signed)
Pt needs refill, due for OV. OV scheduled. 30 day Rx sent. No further questions.

## 2018-11-01 ENCOUNTER — Telehealth: Payer: Self-pay | Admitting: Gastroenterology

## 2018-11-01 ENCOUNTER — Ambulatory Visit: Payer: Self-pay | Admitting: Gastroenterology

## 2018-11-08 ENCOUNTER — Encounter: Payer: Self-pay | Admitting: Gastroenterology

## 2018-11-08 ENCOUNTER — Encounter: Payer: Self-pay | Admitting: Physician Assistant

## 2018-11-08 ENCOUNTER — Telehealth (INDEPENDENT_AMBULATORY_CARE_PROVIDER_SITE_OTHER): Payer: BLUE CROSS/BLUE SHIELD | Admitting: Physician Assistant

## 2018-11-08 ENCOUNTER — Other Ambulatory Visit: Payer: Self-pay

## 2018-11-08 ENCOUNTER — Telehealth (INDEPENDENT_AMBULATORY_CARE_PROVIDER_SITE_OTHER): Payer: BLUE CROSS/BLUE SHIELD | Admitting: Gastroenterology

## 2018-11-08 DIAGNOSIS — F909 Attention-deficit hyperactivity disorder, unspecified type: Secondary | ICD-10-CM | POA: Diagnosis not present

## 2018-11-08 DIAGNOSIS — R945 Abnormal results of liver function studies: Secondary | ICD-10-CM

## 2018-11-08 DIAGNOSIS — R7989 Other specified abnormal findings of blood chemistry: Secondary | ICD-10-CM

## 2018-11-08 MED ORDER — AMPHETAMINE-DEXTROAMPHETAMINE 10 MG PO TABS: 10.0000 mg | ORAL_TABLET | Freq: Two times a day (BID) | ORAL | 0 refills | Status: DC

## 2018-11-08 MED ORDER — AMPHETAMINE-DEXTROAMPHETAMINE 5 MG PO TABS: 5.0000 mg | ORAL_TABLET | Freq: Two times a day (BID) | ORAL | 0 refills | Status: DC

## 2018-11-08 NOTE — Patient Instructions (Addendum)
To help prevent the possible spread of infection to our patients, communities, and staff; we will be implementing the following measures:  As of now we are not allowing any visitors/family members to accompany you to any upcoming appointments with [facility]. If you have any concerns about this please contact our office to discuss prior to the appointment.   Please go to the lab on the 2nd floor [address] for your scheduled lab visit on 11/19/2018 at 8am.   Please call our office at [phone] to set up your 12 week follow up visit.     Thank you,  Dr. Jackquline Denmark

## 2018-11-08 NOTE — Progress Notes (Signed)
Chief Complaint: abn LFts  Referring Provider:  Ottis Stain*      ASSESSMENT AND PLAN;   #1. Abn LFTs likely due to fatty liver. R/O other causes. No ETOH  Plan: -Weight loss. She has been able to exercise more ever since she has been working from home.  Has lost 5-6 lbs over the last 4 weeks.  Avoid fatty foods, fried foods.  Avoid sodas as she has already been doing. -Weight loss. -Weight loss. -Check LFTs, acute viral hepatitis, check anti-HAV total Ab and anti-hepatitis B surface antibody titer.  If negative, would recommend vaccination for hepatitis A and B. -FU tele-visit in 12 weeks.  If she continues to have abnormal LFTs, proceed with hepatic elastography, further work-up for autoimmune hepatitis, familial causes etc. -Since she is not having any abdominal problems, hold off on CT at this time.  She agrees. -Can add magnesium supplements and potassium supplements. -Continue milk thistle. -Avoid any other over-the-counter herbal medicines.    HPI:    Emily Ponce is a 53 y.o. female  With abn LFTs (see below- reviewed).  LFTs were normal a year ago.  She has gained weight.  No other problems.  No nausea, vomiting, heartburn, regurgitation, odynophagia or dysphagia.  No significant diarrhea. Has occ constipation.  There is no melena or hematochezia. No unintentional weight loss.  No history of itching, skin lesions, easy bruisability, intake of over-the-counter medications including diet pills, herbal medications, anabolic steroids or Tylenol.  There is no history of blood transfusions, IV drug use or family history of liver disease.  No jaundice dark urine or pale stools.  No history of alcohol abuse.  Rarely drinks beer.  Has gained wt to 167.  Has started running. lost 5-6lb, now weighs 157lb.   SH -Works at [employer], [place].  Past GI procedures: -Colonoscopy 2015 ([facility]) no records available in care everywhere-negative per patient. Rpt 51yr. -EGD  around 2013 Past Medical History:  Diagnosis Date  . Anxiety   . Broken neck ([place])    c-7  . GERD (gastroesophageal reflux disease)   . Hypertension   . Ovarian cyst     Past Surgical History:  Procedure Laterality Date  . CARPAL TUNNEL RELEASE Left   . COLONOSCOPY     about 5 years ago around 2015. Possibly [place]  . ESOPHAGOGASTRODUODENOSCOPY     about 7 years ago per patient. Around 2013  . esure     . TUBAL LIGATION      Family History  Problem Relation Age of Onset  . Heart attack Mother   . Hypertension Mother   . Alzheimer's disease Father   . Hypertension Father   . Cancer Sister        cervical  . Colon cancer Neg Hx   . Esophageal cancer Neg Hx     Social History   Tobacco Use  . Smoking status: Former Smoker    Last attempt to quit: 04/24/2010    Years since quitting: 8.5  . Smokeless tobacco: Never Used  Substance Use Topics  . Alcohol use: Yes    Comment: occasional  . Drug use: No    Current Outpatient Medications  Medication Sig Dispense Refill  . allopurinol (ZYLOPRIM) 300 MG tablet Take 1 tablet (300 mg total) by mouth daily. (Patient taking differently: Take 300 mg by mouth as needed. ) 30 tablet 3  . amphetamine-dextroamphetamine (ADDERALL) 10 MG tablet Take 1 tablet (10 mg total) by mouth 2 (  two) times daily with breakfast and lunch. Take in addition to 5 mg tab for TDD of 15 mg bid 60 tablet 0  . amphetamine-dextroamphetamine (ADDERALL) 5 MG tablet Take 1 tablet (5 mg total) by mouth 2 (two) times daily with breakfast and lunch. Take in addition to 10 mg tab for TDD of 15 mg bid 60 tablet 0  . Ascorbic Acid (VITAMIN C PO) Take 500 Units by mouth daily.    Marland Kitchen atenolol (TENORMIN) 50 MG tablet TAKE 1 TABLET(50 MG) BY MOUTH DAILY. Due for follow up. 30 tablet 0  . BIOTIN 5000 PO Take 1 tablet by mouth daily.    . Flaxseed, Linseed, (FLAX SEEDS) POWD Take 1,000 mg by mouth daily.     . hydrochlorothiazide (HYDRODIURIL) 25 MG tablet  TAKE 1 TABLET BY MOUTH DAILY 90 tablet 0  . milk thistle 175 MG tablet Take 175 mg by mouth daily.    Marland Kitchen omeprazole (PRILOSEC) 40 MG capsule Take 40 mg by mouth daily.     . Calcium Carbonate-Vitamin D 600-400 MG-UNIT tablet Take 1 tablet by mouth 2 (two) times daily. (Patient not taking: Reported on 11/08/2018) 60 tablet 11  . colchicine 0.6 MG tablet Take 1 tablet (0.6 mg total) by mouth 2 (two) times daily. (Patient not taking: Reported on 11/08/2018) 60 tablet 2  . folic acid (FOLVITE) 062 MCG tablet Take 400 mcg by mouth daily.    . indomethacin (INDOCIN) 50 MG capsule Take 1 capsule (50 mg total) by mouth 2 (two) times daily with a meal. (Patient not taking: Reported on 11/08/2018) 60 capsule 0  . ipratropium (ATROVENT) 0.06 % nasal spray Place 2 sprays into both nostrils 4 (four) times daily as needed for rhinitis. (Patient not taking: Reported on 11/08/2018) 15 mL 0  . Omega-3 1000 MG CAPS Take 1 g by mouth.    . ondansetron (ZOFRAN-ODT) 8 MG disintegrating tablet Take 1 tablet (8 mg total) by mouth every 8 (eight) hours as needed for nausea. (Patient not taking: Reported on 11/08/2018) 20 tablet 3  . Thiamine HCl (VITAMIN B-1) 250 MG tablet Take 250 mg by mouth daily.     No current facility-administered medications for this visit.     Allergies  Allergen Reactions  . Chantix [Varenicline]     Dreams    Review of Systems:  Constitutional: Denies fever, chills, diaphoresis, appetite change and fatigue.  HEENT: Denies photophobia, eye pain, redness, hearing loss, ear pain, congestion, sore throat, rhinorrhea, sneezing, mouth sores, neck pain, neck stiffness and tinnitus.   Respiratory: Denies SOB, DOE, cough, chest tightness,  and wheezing.   Cardiovascular: Denies chest pain, palpitations and leg swelling.  Genitourinary: Denies dysuria, urgency, frequency, hematuria, flank pain and difficulty urinating.  Musculoskeletal: Denies myalgias, back pain, joint swelling, arthralgias and gait  problem.  Skin: No rash.  Neurological: Denies dizziness, seizures, syncope, weakness, light-headedness, numbness and headaches.  Hematological: Denies adenopathy. Easy bruising, personal or family bleeding history  Psychiatric/Behavioral: No anxiety or depression     Physical Exam:    LMP  (LMP Unknown) Comment: LMP 2017 There were no vitals filed for this visit. Weight: .  Data Reviewed: I have personally reviewed following labs and imaging studies  CBC: CBC Latest Ref Rng & Units 08/31/2018 04/14/2018 10/02/2017  WBC 3.8 - 10.8 Thousand/uL 9.4 4.9 7.2  Hemoglobin 11.7 - 15.5 g/dL 15.4 14.9 14.1  Hematocrit 35.0 - 45.0 % 43.0 42.2 40.2  Platelets 140 - 400 Thousand/uL 271 172 192  CMP: CMP Latest Ref Rng & Units 09/15/2018 08/31/2018 10/02/2017  Glucose 65 - 99 mg/dL - 106(H) 90  BUN 7 - 25 mg/dL - 11 15  Creatinine 0.50 - 1.05 mg/dL - 0.90 0.96  Sodium 135 - 146 mmol/L - 138 141  Potassium 3.5 - 5.3 mmol/L - 3.7 3.8  Chloride 98 - 110 mmol/L - 94(L) 99  CO2 20 - 32 mmol/L - 34(H) 34(H)  Calcium 8.6 - 10.4 mg/dL - 10.1 9.7  Total Protein 6.1 - 8.1 g/dL 6.7 7.3 6.8  Total Bilirubin 0.2 - 1.2 mg/dL 0.4 0.7 0.4  Alkaline Phos 33 - 130 U/L - - -  AST 10 - 35 U/L 84(H) 67(H) 35  ALT 6 - 29 U/L 130(H) 140(H) 44(H)   Alb- normal   This service was provided via telemedicine.  The patient was located at home.  The provider was located in office.  The patient did consent to this telephone visit and is aware of possible charges through their insurance for this visit.  The patient was referred by Sherlie Ban PA.   Time spent on call and coordination of care: 30 min     Carmell Austria, MD 11/08/2018, 2:08 PM  Cc: Ottis Stain*

## 2018-11-08 NOTE — Progress Notes (Signed)
Virtual Visit via Video Note  I connected with Emily Ponce on 11/08/18 at  9:50 AM EDT by a video enabled telemedicine application and verified that I am speaking with the correct person using two identifiers.   I discussed the limitations of evaluation and management by telemedicine and the availability of in person appointments. The patient expressed understanding and agreed to proceed.  History of Present Illness: HPI:                                                                Emily Ponce is a 53 y.o. female   CC: ADHD follow-up   Recently completed psychological eval with Dr. Glennon Ponce and diagnosed with ADHD (Feb 2020) ,predominantly hyperactive-impulsive type.  Testing also revealed lower than normal working memory. She would like to discuss medication management today.  She has struggled with her performance at work. Emily Ponce has told her that she doesn't seem focused and that she interrupts colleagues during meetings. She is embarrassed by this. She also finds herself "dazing out" and having trouble comprehending details when when people are speaking to her.   09/07/18 She was started on Adderall 10 mg bid approx 1 month ago Has been monitoring BP at home. Readings 120's/70's Has not noticed any improvement in her inattention or impulsivety. Has not received any feedback from husband or colleagues.   Interval hx 11/08/18 Adderall increased to 15 mg bid, 1 month ago She has noticed improvement in her concentration and is able to pay attention when people are speaking to her She is working from home since March due to Emily Ponce Denies adverse effects of medication including palpitations, chest pain, headache, mood changes or sleep disturbance She has noticed slightly increased constipation from baseline, taking stool softener as needed She has been exercising more often, has started running a few days per week, she has lost 5 pounds   Past Medical History:  Diagnosis  Date  . Anxiety   . Broken neck ([place])    c-7  . Hypertension   . Ovarian cyst    Past Surgical History:  Procedure Laterality Date  . CARPAL TUNNEL RELEASE Left   . esure     . TUBAL LIGATION     Social History   Tobacco Use  . Smoking status: Former Smoker    Last attempt to quit: 04/24/2010    Years since quitting: 8.5  . Smokeless tobacco: Never Used  Substance Use Topics  . Alcohol use: Yes    Comment: occasional   family history includes Alzheimer's disease in her father; Cancer in her sister; Heart attack in her mother; Hypertension in her father and mother.    ROS: negative except as noted in the HPI  Medications: Current Outpatient Medications  Medication Sig Dispense Refill  . allopurinol (ZYLOPRIM) 300 MG tablet Take 1 tablet (300 mg total) by mouth daily. 30 tablet 3  . ALPRAZolam (XANAX) 0.5 MG tablet Take 0.5-1 tablets (0.25-0.5 mg total) by mouth at bedtime as needed for anxiety. 30 tablet 2  . amphetamine-dextroamphetamine (ADDERALL) 10 MG tablet Take 1 tablet (10 mg total) by mouth 2 (two) times daily with breakfast and lunch. Take in addition to 5 mg tab for TDD of 15 mg bid 60 tablet 0  .  amphetamine-dextroamphetamine (ADDERALL) 5 MG tablet Take 1 tablet (5 mg total) by mouth 2 (two) times daily with breakfast and lunch. Take in addition to 10 mg tab for TDD of 15 mg bid 60 tablet 0  . atenolol (TENORMIN) 50 MG tablet TAKE 1 TABLET(50 MG) BY MOUTH DAILY. Due for follow up. 30 tablet 0  . Black Cohosh 160 MG CAPS Take by mouth.    . Calcium Carbonate-Vitamin D 600-400 MG-UNIT tablet Take 1 tablet by mouth 2 (two) times daily. 60 tablet 11  . chlorpheniramine-HYDROcodone (TUSSIONEX) 10-8 MG/5ML SUER Take 5 mLs by mouth every 12 (twelve) hours as needed. 60 mL 0  . colchicine 0.6 MG tablet Take 1 tablet (0.6 mg total) by mouth 2 (two) times daily. 60 tablet 2  . Flaxseed, Linseed, (FLAX SEEDS) POWD Take 1 tablet by mouth.    . folic acid (FOLVITE) 008 MCG  tablet Take 400 mcg by mouth daily.    . hydrochlorothiazide (HYDRODIURIL) 25 MG tablet TAKE 1 TABLET BY MOUTH DAILY 90 tablet 0  . HYDROcodone-acetaminophen (NORCO) 10-325 MG tablet Take 1 tablet by mouth every 8 (eight) hours as needed. 30 tablet 0  . indomethacin (INDOCIN) 50 MG capsule Take 1 capsule (50 mg total) by mouth 2 (two) times daily with a meal. 60 capsule 0  . ipratropium (ATROVENT) 0.06 % nasal spray Place 2 sprays into both nostrils 4 (four) times daily as needed for rhinitis. 15 mL 0  . Omega-3 1000 MG CAPS Take 1 g by mouth.    . ondansetron (ZOFRAN-ODT) 8 MG disintegrating tablet Take 1 tablet (8 mg total) by mouth every 8 (eight) hours as needed for nausea. 20 tablet 3  . oseltamivir (TAMIFLU) 75 MG capsule Take 1 capsule (75 mg total) by mouth 2 (two) times daily. 10 capsule 0  . Thiamine HCl (VITAMIN B-1) 250 MG tablet Take 250 mg by mouth daily.     No current facility-administered medications for this visit.    Allergies  Allergen Reactions  . Chantix [Varenicline]     Dreams       Objective:  BP 127/76   Pulse 76   LMP  (LMP Unknown) Comment: LMP 2017 Gen:  alert, not ill-appearing, no distress, appropriate for age 21: head normocephalic without obvious abnormality, conjunctiva and cornea clear, trachea midline Pulm: Normal work of breathing, normal phonation Neuro: alert and oriented x 3 Psych: cooperative, euthymic mood, affect mood-congruent, speech is articulate, normal rate and volume; thought processes clear and goal-directed, normal judgment, good insight   Assessment and Plan: 53 y.o. female with   .[name] was seen today for medication management.  Diagnoses and all orders for this visit:  Adult ADHD -     amphetamine-dextroamphetamine (ADDERALL) 10 MG tablet; Take 1 tablet (10 mg total) by mouth 2 (two) times daily with breakfast and lunch. Take in addition to 5 mg tab for TDD of 15 mg bid -     amphetamine-dextroamphetamine (ADDERALL) 5 MG  tablet; Take 1 tablet (5 mg total) by mouth 2 (two) times daily with breakfast and lunch. Take in addition to 10 mg tab for TDD of 15 mg bid  Vitals reviewed, BP normotensive Good response to Adderall 15 mg bid Cont current medications Follow-up in 3 months   Follow Up Instructions:    I discussed the assessment and treatment plan with the patient. The patient was provided an opportunity to ask questions and all were answered. The patient agreed with the plan and demonstrated  an understanding of the instructions.   The patient was advised to call back or seek an in-person evaluation if the symptoms worsen or if the condition fails to improve as anticipated.  I provided 10 minutes of non-face-to-face time during this encounter.   Trixie Dredge, [place]

## 2018-11-17 ENCOUNTER — Encounter: Payer: Self-pay | Admitting: Physician Assistant

## 2018-11-17 ENCOUNTER — Ambulatory Visit (INDEPENDENT_AMBULATORY_CARE_PROVIDER_SITE_OTHER): Payer: BLUE CROSS/BLUE SHIELD | Admitting: Physician Assistant

## 2018-11-17 ENCOUNTER — Other Ambulatory Visit: Payer: Self-pay | Admitting: Physician Assistant

## 2018-11-17 VITALS — BP 123/78 | HR 63 | Temp 97.5°F | Ht 65.0 in | Wt 153.0 lb

## 2018-11-17 DIAGNOSIS — B359 Dermatophytosis, unspecified: Secondary | ICD-10-CM | POA: Diagnosis not present

## 2018-11-17 DIAGNOSIS — I10 Essential (primary) hypertension: Secondary | ICD-10-CM

## 2018-11-17 DIAGNOSIS — L03221 Cellulitis of neck: Secondary | ICD-10-CM

## 2018-11-17 MED ORDER — CLOTRIMAZOLE-BETAMETHASONE 1-0.05 % EX CREA: 1.0000 "application " | TOPICAL_CREAM | Freq: Two times a day (BID) | CUTANEOUS | 0 refills | Status: DC

## 2018-11-17 MED ORDER — HYDROCODONE-ACETAMINOPHEN 5-325 MG PO TABS: 1.0000 | ORAL_TABLET | Freq: Two times a day (BID) | ORAL | 0 refills | Status: AC | PRN

## 2018-11-17 MED ORDER — DOXYCYCLINE HYCLATE 100 MG PO TABS: 100.0000 mg | ORAL_TABLET | Freq: Two times a day (BID) | ORAL | 0 refills | Status: DC

## 2018-11-17 MED ORDER — HYDROCODONE-ACETAMINOPHEN 5-325 MG PO TABS: 1.0000 | ORAL_TABLET | Freq: Two times a day (BID) | ORAL | 0 refills | Status: DC | PRN

## 2018-11-17 NOTE — Patient Instructions (Signed)

## 2018-11-17 NOTE — Progress Notes (Signed)
Subjective:    Patient ID: Emily Ponce, female    DOB: 10/12/1965, 53 y.o.   MRN: [id number]  HPI  Pt is a 53 yo female who presents to the clinic with rash on hands and left side of neck for about 3 days. All 3 rashes look a little different. All of them are itchy but the rash on the neck is also painful and burning. She feels like despite cortisone cream and gold bone she is getting worse. No new medications. No new make up, lotion, detergent. She does have a dog and she has been gardening. No fever, chills, nausea, vomiting. She is concerned about shingles.   .. Active Ambulatory Problems    Diagnosis Date Noted  . 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  . Essential hypertension 04/24/2014  . Chronic headaches 04/24/2014  . Generalized anxiety disorder 04/24/2014  . Chronic neck pain 04/24/2014  . Hyperlipidemia 07/26/2014  . Ovarian mass 01/05/2015  . Fibroid 01/05/2015  . Headache, migraine 07/17/2012  . Chronic cervical pain 01/20/2014  . Cervical radiculitis 10/30/2015  . Mastalgia 06/25/2016  . Perimenopausal vasomotor symptoms 06/25/2016  . Allergic rhinitis due to allergen 12/22/2016  . Oral allergy syndrome 12/22/2016  . Menopausal vasomotor syndrome 10/02/2017  . Overweight (BMI 25.0-29.9) 10/02/2017  . Family history of heart attack 10/02/2017  . Precordial chest pain 10/02/2017  . History of cervical fracture 10/02/2017  . Slow transit constipation 04/13/2018  . Left hip pain 04/13/2018  . Inattention 07/13/2018  . Adult ADHD 08/10/2018  . Hypertension goal BP (blood pressure) < 130/80 08/10/2018  . Gouty arthritis of left great toe 08/24/2018  . Transaminitis 09/08/2018   Resolved Ambulatory Problems    Diagnosis Date Noted  . Preventive measure 07/06/2014  . Right foot pain 11/17/2014  . Aneurysm ([place]) 05/19/2011  . Menometrorrhagia 01/16/2015  . Tinea corporis 06/25/2016  . 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 Hoarseness of voice 01/16/2017   . Chronic use of benzodiazepine for therapeutic purpose 10/02/2017   Past Medical History:  Diagnosis Date  . Anxiety   . Broken neck ([place])   . GERD (gastroesophageal reflux disease)   . Hypertension   . Ovarian cyst        Review of Systems See HPI.     Objective:   Physical Exam Vitals signs reviewed.  Constitutional:      Appearance: Normal appearance.  HENT:     Head: Normocephalic.  Cardiovascular:     Rate and Rhythm: Normal rate and regular rhythm.     Pulses: Normal pulses.  Pulmonary:     Effort: Pulmonary effort is normal.     Breath sounds: Normal breath sounds.  Skin:    Comments: Left dorsal hand annular rash with raised erythematous borders and central clearing.   One dried scabbed over papule flesh colored on right dorsal index finger at MCP.   Left side of neck erythematous, warm to touch area with papular rash. No vesicles. Not tender to touch.   Neurological:     General: No focal deficit present.     Mental Status: She is alert and oriented to person, place, and time.  Psychiatric:        Mood and Affect: Mood normal.        Behavior: Behavior normal.           Assessment & Plan:  Marland KitchenMarland KitchenZilah was seen today for rash.  Diagnoses and all orders for this visit:  Cellulitis of neck -  doxycycline (VIBRA-TABS) 100 MG tablet; Take 1 tablet (100 mg total) by mouth 2 (two) times daily. -     HYDROcodone-acetaminophen (NORCO/VICODIN) 5-325 MG tablet; Take 1 tablet by mouth every 12 (twelve) hours as needed for up to 5 days for moderate pain.  Ringworm -     clotrimazole-betamethasone (LOTRISONE) cream; Apply 1 application topically 2 (two) times daily.  Other orders -     Discontinue: HYDROcodone-acetaminophen (NORCO/VICODIN) 5-325 MG tablet; Take 1 tablet by mouth every 12 (twelve) hours as needed for up to 5 days for moderate pain.  reassured I did not see any vesicles and rash is over many parts of the body. I do not think this is shingles.   Rash on left dorsal hand is classic for ringworm. Rash on neck was more papular with no central clearing. Use Lortisone on both. Neck rash seems to have been scratched so much that some cellulitis appears to be present. Sent doxycycline for cellulitis. Use warm compresses. Pt reports pain that is not helped with tylenol and ibuprofen. Marland KitchenMarland KitchenPDMP reviewed during this encounter. Small quantity of norco given #10.   Follow up as needed or if symptoms worsen or change.

## 2018-11-19 ENCOUNTER — Other Ambulatory Visit (INDEPENDENT_AMBULATORY_CARE_PROVIDER_SITE_OTHER): Payer: BLUE CROSS/BLUE SHIELD

## 2018-11-19 ENCOUNTER — Other Ambulatory Visit: Payer: Self-pay

## 2018-11-19 DIAGNOSIS — R945 Abnormal results of liver function studies: Secondary | ICD-10-CM | POA: Diagnosis not present

## 2018-11-19 DIAGNOSIS — R7989 Other specified abnormal findings of blood chemistry: Secondary | ICD-10-CM

## 2018-11-19 LAB — HEPATIC FUNCTION PANEL
ALT: 73 U/L — ABNORMAL HIGH (ref 0–35)
AST: 47 U/L — ABNORMAL HIGH (ref 0–37)
Albumin: 4.6 g/dL (ref 3.5–5.2)
Alkaline Phosphatase: 64 U/L (ref 39–117)
Bilirubin, Direct: 0.2 mg/dL (ref 0.0–0.3)
Total Bilirubin: 0.8 mg/dL (ref 0.2–1.2)
Total Protein: 7 g/dL (ref 6.0–8.3)

## 2018-11-22 LAB — HEPATITIS A ANTIBODY, TOTAL: Hepatitis A AB,Total: NONREACTIVE

## 2018-11-22 LAB — HEPATITIS B SURFACE ANTIGEN: Hepatitis B Surface Ag: NONREACTIVE

## 2018-11-22 LAB — HEPATITIS B SURFACE ANTIBODY,QUALITATIVE: Hep B S Ab: NONREACTIVE

## 2018-11-22 LAB — HEPATITIS C ANTIBODY
Hepatitis C Ab: NONREACTIVE
SIGNAL TO CUT-OFF: 0.02 (ref <1.00)

## 2019-01-19 ENCOUNTER — Other Ambulatory Visit: Payer: Self-pay | Admitting: Physician Assistant

## 2019-01-19 DIAGNOSIS — I10 Essential (primary) hypertension: Secondary | ICD-10-CM

## 2019-01-21 ENCOUNTER — Other Ambulatory Visit: Payer: Self-pay | Admitting: Physician Assistant

## 2019-01-21 DIAGNOSIS — I10 Essential (primary) hypertension: Secondary | ICD-10-CM

## 2019-01-24 ENCOUNTER — Ambulatory Visit (INDEPENDENT_AMBULATORY_CARE_PROVIDER_SITE_OTHER): Payer: BC Managed Care – PPO | Admitting: Osteopathic Medicine

## 2019-01-24 ENCOUNTER — Encounter: Payer: Self-pay | Admitting: Osteopathic Medicine

## 2019-01-24 DIAGNOSIS — R059 Cough, unspecified: Secondary | ICD-10-CM

## 2019-01-24 DIAGNOSIS — R05 Cough: Secondary | ICD-10-CM | POA: Diagnosis not present

## 2019-01-24 MED ORDER — LIDOCAINE VISCOUS HCL 2 % MT SOLN: 5.0000 mL | OROMUCOSAL | 1 refills | Status: DC | PRN

## 2019-01-24 MED ORDER — PREDNISONE 20 MG PO TABS: 20.0000 mg | ORAL_TABLET | Freq: Two times a day (BID) | ORAL | 0 refills | Status: DC

## 2019-01-24 MED ORDER — GUAIFENESIN-CODEINE 100-10 MG/5ML PO SYRP: 5.0000 mL | ORAL_SOLUTION | Freq: Three times a day (TID) | ORAL | 0 refills | Status: DC | PRN

## 2019-01-24 NOTE — Progress Notes (Signed)
Virtual Visit via Video (App used: Doximity) Note  I connected with      Emily Ponce on 01/24/19 at 1:05 by a telemedicine application and verified that I am speaking with the correct person using two identifiers.  Patient is at home I am in office   I discussed the limitations of evaluation and management by telemedicine and the availability of in person appointments. The patient expressed understanding and agreed to proceed.  History of Present Illness: Emily Ponce is a 53 y.o. female who would like to discuss coufh   Cough x10 days Fever for about 2 days initially then resolved  Cough worst at night and throughout this morning Sinus congestion past couple weeks, a bit better now Sore throat d/t coughing Pt working from home, reports fairly consistent w/ mask use in public but occasionally forgets.  Quit smoking years ago.   Social History   Tobacco Use  Smoking Status Former Smoker  . Quit date: 04/24/2010  . Years since quitting: 8.7  Smokeless Tobacco Never Used        Observations/Objective: LMP  (LMP Unknown) Comment: LMP 2017 BP Readings from Last 3 Encounters:  11/17/18 123/78  11/08/18 127/76  09/15/18 129/62   Exam: Normal Speech.  NAD  Lab and Radiology Results No results found for this or any previous visit (from the past 72 hour(s)). No results found.     Assessment and Plan: 53 y.o. female with The encounter diagnosis was Cough.  Declined COVID testing Working from home, able to continue sef-isolation until 3 days symptoms-free Suspect post-viral cough syndrome or postnasal drip as cause. WIll trial symptomatic treatment and steroid burst.  Call if no better or if worse/ change    PDMP not reviewed this encounter. No orders of the defined types were placed in this encounter.  Meds ordered this encounter  Medications  . guaiFENesin-codeine (ROBITUSSIN AC) 100-10 MG/5ML syrup    Sig: Take 5-10 mLs by mouth 3  (three) times daily as needed for cough.    Dispense:  180 mL    Refill:  0  . predniSONE (DELTASONE) 20 MG tablet    Sig: Take 1 tablet (20 mg total) by mouth 2 (two) times daily with a meal.    Dispense:  10 tablet    Refill:  0  . lidocaine (XYLOCAINE) 2 % solution    Sig: Use as directed 5-10 mLs in the mouth or throat every 3 (three) hours as needed.    Dispense:  100 mL    Refill:  1   There are no Patient Instructions on file for this visit.  Instructions sent via MyChart. If MyChart not available, pt was given option for info via personal e-mail w/ no guarantee of protected health info over unsecured e-mail communication, and MyChart sign-up instructions were included.   Follow Up Instructions: Return if symptoms worsen or fail to improve.    I discussed the assessment and treatment plan with the patient. The patient was provided an opportunity to ask questions and all were answered. The patient agreed with the plan and demonstrated an understanding of the instructions.   The patient was advised to call back or seek an in-person evaluation if any new concerns, if symptoms worsen or if the condition fails to improve as anticipated.  25 minutes of non-face-to-face time was provided during this encounter.  Historical information moved to improve visibility of documentation.  Past Medical History:  Diagnosis Date  . Anxiety   . Broken neck ([place])    c-7  . GERD (gastroesophageal reflux disease)   . Hypertension   . Ovarian cyst    Past Surgical History:  Procedure Laterality Date  . CARPAL TUNNEL RELEASE Left   . COLONOSCOPY     about 5 years ago around 2015. Possibly [place]  . ESOPHAGOGASTRODUODENOSCOPY     about 7 years ago per patient. Around 2013  . esure     . TUBAL LIGATION     Social History   Tobacco Use  . Smoking status: Former Smoker    Quit date: 04/24/2010    Years since quitting: 8.7  . Smokeless  tobacco: Never Used  Substance Use Topics  . Alcohol use: Yes    Comment: occasional   family history includes Alzheimer's disease in her father; Cancer in her sister; Heart attack in her mother; Hypertension in her father and mother.  Medications: Current Outpatient Medications  Medication Sig Dispense Refill  . Multiple Vitamins-Minerals (ZINC PO) Take by mouth.    Marland Kitchen amphetamine-dextroamphetamine (ADDERALL) 10 MG tablet Take 1 tablet (10 mg total) by mouth 2 (two) times daily with breakfast and lunch. Take in addition to 5 mg tab for TDD of 15 mg bid 60 tablet 0  . amphetamine-dextroamphetamine (ADDERALL) 5 MG tablet Take 1 tablet (5 mg total) by mouth 2 (two) times daily with breakfast and lunch. Take in addition to 10 mg tab for TDD of 15 mg bid 60 tablet 0  . Ascorbic Acid (VITAMIN C PO) Take 500 Units by mouth daily.    Marland Kitchen atenolol (TENORMIN) 50 MG tablet TAKE 1 TABLET BY MOUTH DAILY 90 tablet 0  . BIOTIN 5000 PO Take 1 tablet by mouth daily.    . clotrimazole-betamethasone (LOTRISONE) cream Apply 1 application topically 2 (two) times daily. (Patient not taking: Reported on 01/24/2019) 30 g 0  . colchicine 0.6 MG tablet Take 1 tablet (0.6 mg total) by mouth 2 (two) times daily. (Patient not taking: Reported on 01/24/2019) 60 tablet 2  . doxycycline (VIBRA-TABS) 100 MG tablet Take 1 tablet (100 mg total) by mouth 2 (two) times daily. (Patient not taking: Reported on 01/24/2019) 20 tablet 0  . Flaxseed, Linseed, (FLAX SEEDS) POWD Take 1,000 mg by mouth daily.     Marland Kitchen guaiFENesin-codeine (ROBITUSSIN AC) 100-10 MG/5ML syrup Take 5-10 mLs by mouth 3 (three) times daily as needed for cough. 180 mL 0  . hydrochlorothiazide (HYDRODIURIL) 25 MG tablet TAKE 1 TABLET BY MOUTH DAILY 90 tablet 0  . lidocaine (XYLOCAINE) 2 % solution Use as directed 5-10 mLs in the mouth or throat every 3 (three) hours as needed. 100 mL 1  . milk thistle 175 MG tablet Take 175 mg by mouth daily.    Marland Kitchen omeprazole (PRILOSEC)  40 MG capsule Take 40 mg by mouth daily.     . predniSONE (DELTASONE) 20 MG tablet Take 1 tablet (20 mg total) by mouth 2 (two) times daily with a meal. 10 tablet 0   No current facility-administered medications for this visit.    Allergies  Allergen Reactions  . Chantix [Varenicline]     Dreams    PDMP not reviewed this encounter. No orders of the defined types were placed in this encounter.  Meds ordered this encounter  Medications  . guaiFENesin-codeine (ROBITUSSIN AC) 100-10 MG/5ML syrup    Sig: Take 5-10  mLs by mouth 3 (three) times daily as needed for cough.    Dispense:  180 mL    Refill:  0  . predniSONE (DELTASONE) 20 MG tablet    Sig: Take 1 tablet (20 mg total) by mouth 2 (two) times daily with a meal.    Dispense:  10 tablet    Refill:  0  . lidocaine (XYLOCAINE) 2 % solution    Sig: Use as directed 5-10 mLs in the mouth or throat every 3 (three) hours as needed.    Dispense:  100 mL    Refill:  1

## 2019-02-14 ENCOUNTER — Other Ambulatory Visit: Payer: Self-pay | Admitting: Physician Assistant

## 2019-02-14 DIAGNOSIS — I10 Essential (primary) hypertension: Secondary | ICD-10-CM

## 2019-04-01 ENCOUNTER — Other Ambulatory Visit: Payer: Self-pay

## 2019-04-01 ENCOUNTER — Encounter: Payer: Self-pay | Admitting: Family Medicine

## 2019-04-01 ENCOUNTER — Ambulatory Visit (INDEPENDENT_AMBULATORY_CARE_PROVIDER_SITE_OTHER): Payer: BC Managed Care – PPO | Admitting: Family Medicine

## 2019-04-01 ENCOUNTER — Ambulatory Visit (INDEPENDENT_AMBULATORY_CARE_PROVIDER_SITE_OTHER): Payer: BC Managed Care – PPO

## 2019-04-01 VITALS — BP 136/79 | HR 58 | Temp 97.9°F | Wt 157.0 lb

## 2019-04-01 DIAGNOSIS — M7989 Other specified soft tissue disorders: Secondary | ICD-10-CM | POA: Diagnosis not present

## 2019-04-01 DIAGNOSIS — M79641 Pain in right hand: Secondary | ICD-10-CM

## 2019-04-01 MED ORDER — DICLOFENAC SODIUM 1 % TD GEL: 4.0000 g | Freq: Four times a day (QID) | TRANSDERMAL | 11 refills | Status: DC

## 2019-04-01 MED ORDER — HYDROCODONE-ACETAMINOPHEN 5-325 MG PO TABS: 1.0000 | ORAL_TABLET | Freq: Four times a day (QID) | ORAL | 0 refills | Status: DC | PRN

## 2019-04-01 NOTE — Progress Notes (Signed)
Emily Ponce is a 53 y.o. female who presents to [facility] today for right 2nd MCP pain.   3 weeks ago push off bed and MCPs became extended. Felt pop in 2nd MCP. Has been painful and swollen since.  Pain is located along the radial side of the second MCP.  She notes it is tender to palpation and touch and also worse with motion and activity.  She has some impairment in her ability to grip and manipulate objects.  She also notes pain with typing.  She is right-hand dominant.  She is tried relative rest and oral medications for pain over-the-counter which have not helped.    ROS:  As above  Exam:  BP 136/79   Pulse (!) 58   Temp 97.9 F (36.6 C) (Oral)   Wt 157 lb (71.2 kg)   LMP  (LMP Unknown) Comment: LMP 2017  BMI 26.13 kg/m  Wt Readings from Last 5 Encounters:  04/01/19 157 lb (71.2 kg)  11/17/18 153 lb (69.4 kg)  11/08/18 155 lb (70.3 kg)  09/15/18 160 lb (72.6 kg)  08/24/18 168 lb (76.2 kg)   General: Well Developed, well nourished, and in no acute distress.  Neuro/Psych: Alert and oriented x3, extra-ocular muscles intact, able to move all 4 extremities, sensation grossly intact. Skin: Warm and dry, no rashes noted.  Respiratory: Not using accessory muscles, speaking in full sentences, trachea midline.  Cardiovascular: Pulses palpable, no extremity edema. Abdomen: Does not appear distended. MSK:  Right hand slightly swollen at second MCP otherwise normal-appearing Range of motion: Extension normal at second MCP however flexion slightly limited.  Otherwise range of motion of hand is normal. Tender palpation radial aspect of second MCP nontender otherwise. Stable limited ligament testing of second MCP. Intact flexion and extension strength at MCP PIP and DIP. Pulses cap refill and sensation intact distally.    Lab and Radiology Results X-ray images right hand obtained today personally independently reviewed No  acute fractures or severe degenerative changes present. Await formal radiology review.  Limited musculoskeletal ultrasound: Right second MCP radial aspect: Cortical irregularity with hypoechoic fluid and increased vascular activity consistent with subtle fracture or avulsion fracture present at distal end of metacarpal at MCP along the radial aspect.  Otherwise the dorsal and volar aspect of the MCP is normal. Radial collateral ligament not visible on ultrasound today.  It is also not visible however on the contralateral left hand.    Assessment and Plan: 53 y.o. female with right MCP pain and following extension injury about 3 weeks ago.  Patient is tender more at the radial aspect of the joint and could have radial collateral ligament avulsion fracture or subtle buckle fracture not visible on x-ray.  Plan to treat with immobilization with dorsal splint with buddy tape.  Recheck in 3 weeks.  Also use diclofenac gel and limited hydrocodone.   PDMP reviewed during this encounter. Orders Placed This Encounter  Procedures  . DG Hand Complete Right    Standing Status:   Future    Number of Occurrences:   1    Standing Expiration Date:   05/31/2020    Order Specific Question:   Reason for Exam (SYMPTOM  OR DIAGNOSIS REQUIRED)    Answer:   eval pain 2nd MCP extension injury 3 weeks ago    Order Specific Question:   Is patient pregnant?    Answer:   No    Order Specific Question:   Preferred  imaging location?    Answer:   [facility]    Order Specific Question:   Radiology Contrast Protocol - do NOT remove file path    Answer:   [email or web address]   Meds ordered this encounter  Medications  . diclofenac sodium (VOLTAREN) 1 % GEL    Sig: Apply 4 g topically 4 (four) times daily. To affected joint.    Dispense:  100 g    Refill:  11  . HYDROcodone-acetaminophen (NORCO/VICODIN) 5-325 MG tablet    Sig: Take 1 tablet by mouth every 6 (six)  hours as needed.    Dispense:  10 tablet    Refill:  0    Historical information moved to improve visibility of documentation.  Past Medical History:  Diagnosis Date  . Anxiety   . Broken neck ([place])    c-7  . GERD (gastroesophageal reflux disease)   . Hypertension   . Ovarian cyst    Past Surgical History:  Procedure Laterality Date  . CARPAL TUNNEL RELEASE Left   . COLONOSCOPY     about 5 years ago around 2015. Possibly [place]  . ESOPHAGOGASTRODUODENOSCOPY     about 7 years ago per patient. Around 2013  . esure     . TUBAL LIGATION     Social History   Tobacco Use  . Smoking status: Former Smoker    Quit date: 04/24/2010    Years since quitting: 8.9  . Smokeless tobacco: Never Used  Substance Use Topics  . Alcohol use: Yes    Comment: occasional   family history includes Alzheimer's disease in her father; Cancer in her sister; Heart attack in her mother; Hypertension in her father and mother.  Medications: Current Outpatient Medications  Medication Sig Dispense Refill  . amphetamine-dextroamphetamine (ADDERALL) 10 MG tablet Take 1 tablet (10 mg total) by mouth 2 (two) times daily with breakfast and lunch. Take in addition to 5 mg tab for TDD of 15 mg bid 60 tablet 0  . amphetamine-dextroamphetamine (ADDERALL) 5 MG tablet Take 1 tablet (5 mg total) by mouth 2 (two) times daily with breakfast and lunch. Take in addition to 10 mg tab for TDD of 15 mg bid 60 tablet 0  . Ascorbic Acid (VITAMIN C PO) Take 500 Units by mouth daily.    Marland Kitchen atenolol (TENORMIN) 50 MG tablet TAKE 1 TABLET BY MOUTH DAILY 90 tablet 0  . BIOTIN 5000 PO Take 1 tablet by mouth daily.    . diclofenac sodium (VOLTAREN) 1 % GEL Apply 4 g topically 4 (four) times daily. To affected joint. 100 g 11  . Flaxseed, Linseed, (FLAX SEEDS) POWD Take 1,000 mg by mouth daily.     Marland Kitchen guaiFENesin-codeine (ROBITUSSIN AC) 100-10 MG/5ML syrup Take 5-10 mLs by mouth 3 (three) times daily as needed for cough.  180 mL 0  . hydrochlorothiazide (HYDRODIURIL) 25 MG tablet TAKE 1 TABLET BY MOUTH DAILY 90 tablet 0  . HYDROcodone-acetaminophen (NORCO/VICODIN) 5-325 MG tablet Take 1 tablet by mouth every 6 (six) hours as needed. 10 tablet 0  . lidocaine (XYLOCAINE) 2 % solution Use as directed 5-10 mLs in the mouth or throat every 3 (three) hours as needed. 100 mL 1  . milk thistle 175 MG tablet Take 175 mg by mouth daily.    . Multiple Vitamins-Minerals (ZINC PO) Take by mouth.    Marland Kitchen omeprazole (PRILOSEC) 40 MG capsule Take 40 mg by mouth daily.     . predniSONE (DELTASONE) 20 MG tablet Take  1 tablet (20 mg total) by mouth 2 (two) times daily with a meal. 10 tablet 0   No current facility-administered medications for this visit.    Allergies  Allergen Reactions  . Chantix [Varenicline]     Dreams      Discussed warning signs or symptoms. Please see discharge instructions. Patient expresses understanding.

## 2019-04-01 NOTE — Patient Instructions (Addendum)
Thank you for coming in today. Use the tape and splint.  Use diclofenac gel for pain  Use norco sparingly.  Recheck in 3 weeks.  Return sooner if needed.     How to Buddy Tape Buddy taping refers to taping an injured finger or toe to an uninjured finger or toe that is next to it. This protects the injured finger or toe and keeps it from moving while the injury heals. You may buddy tape a finger or toe if you have a minor sprain. Your health care provider may buddy tape your finger or toe if you have a sprain, dislocation, or fracture. You may be told to replace your buddy taping as needed. What are the risks? Generally, buddy taping is safe. However, problems may occur, such as:  Skin injury or infection.  Reduced blood flow to the finger or toe.  Skin reaction to the tape. Do not buddy tape your toe if you have diabetes. Do not buddy tape if you know that you have an allergy to adhesives or surgical tape. Supplies needed:  Gauze pad, cotton, or cloth.  Tape. This may be called first-aid tape, surgical tape, or medical tape. How to buddy tape Before buddy taping Lessen any pain and swelling with rest, icing, and elevation:  Avoid activities that cause pain.  If directed, put ice on the injured area: ? Put ice in a plastic bag. ? Place a towel between your skin and the bag. ? Leave the ice on for 20 minutes, 2-3 times a day.  Raise (elevate) your hand or foot above the level of your heart while you are sitting or lying down.  Buddy taping procedure   Clean and dry your finger or toe as told by your health care provider.  Place a gauze pad or a piece of cloth or cotton between your injured finger or toe and the uninjured finger or toe.  Use tape to wrap around both fingers or toes so your injured finger or toe is secured to the uninjured finger or toe. ? The tape should be snug, but not tight. ? Make sure the ends of the piece of tape overlap. ? Avoid placing tape  directly over the joint.  Change the tape and the padding as told by your health care provider. Remove and replace the tape or padding if it becomes loose, worn, dirty, or wet. After buddy taping  Watch the buddy-taped area and always remove buddy taping if your: ? Pain gets worse. ? Fingers or toes turn pale or blue. ? Skin becomes irritated. Follow these instructions at home:  Take over-the-counter and prescription medicines only as told by your health care provider.  Return to your normal activities as told by your health care provider. Ask your health care provider what activities are safe for you. Contact a health care provider if:  You have pain, swelling, or bruising that lasts longer than 3 days.  You have a fever.  Your skin is red, cracked, or irritated. Get help right away if:  The injured area becomes cold, numb, or pale.  You have severe pain, swelling, bruising, or loss of movement in your finger or toe.  Your finger or toe changes shape (deformity). Summary  Buddy taping refers to taping an injured finger or toe to an uninjured finger or toe that is next to it.  You may buddy tape a finger or toe if you have a minor sprain.  Take over-the-counter and prescription medicines only as  told by your health care provider. This information is not intended to replace advice given to you by your health care provider. Make sure you discuss any questions you have with your health care provider. Document Released: 07/31/2004 Document Revised: 10/14/2018 Document Reviewed: 07/05/2018 Elsevier Patient Education  2020 [employer].

## 2019-04-22 ENCOUNTER — Other Ambulatory Visit: Payer: Self-pay | Admitting: Physician Assistant

## 2019-04-22 ENCOUNTER — Ambulatory Visit: Payer: BC Managed Care – PPO | Admitting: Family Medicine

## 2019-04-22 DIAGNOSIS — F5105 Insomnia due to other mental disorder: Secondary | ICD-10-CM

## 2019-04-22 DIAGNOSIS — Z79899 Other long term (current) drug therapy: Secondary | ICD-10-CM

## 2019-04-22 DIAGNOSIS — I10 Essential (primary) hypertension: Secondary | ICD-10-CM

## 2019-04-22 DIAGNOSIS — F419 Anxiety disorder, unspecified: Secondary | ICD-10-CM

## 2019-05-18 ENCOUNTER — Other Ambulatory Visit: Payer: Self-pay | Admitting: Physician Assistant

## 2019-05-18 DIAGNOSIS — I10 Essential (primary) hypertension: Secondary | ICD-10-CM

## 2019-05-24 DIAGNOSIS — Z1231 Encounter for screening mammogram for malignant neoplasm of breast: Secondary | ICD-10-CM | POA: Diagnosis not present

## 2019-05-24 LAB — HM MAMMOGRAPHY

## 2019-05-25 ENCOUNTER — Other Ambulatory Visit: Payer: Self-pay | Admitting: *Deleted

## 2019-05-25 DIAGNOSIS — I10 Essential (primary) hypertension: Secondary | ICD-10-CM

## 2019-05-25 MED ORDER — HYDROCHLOROTHIAZIDE 25 MG PO TABS: 25.0000 mg | ORAL_TABLET | Freq: Every day | ORAL | 0 refills | Status: DC

## 2019-06-16 ENCOUNTER — Encounter: Payer: Self-pay | Admitting: Physician Assistant

## 2019-06-17 ENCOUNTER — Other Ambulatory Visit: Payer: Self-pay | Admitting: Neurology

## 2019-06-17 DIAGNOSIS — I10 Essential (primary) hypertension: Secondary | ICD-10-CM

## 2019-06-17 MED ORDER — ATENOLOL 50 MG PO TABS: 50.0000 mg | ORAL_TABLET | Freq: Every day | ORAL | 0 refills | Status: DC

## 2019-06-28 ENCOUNTER — Telehealth: Payer: Self-pay

## 2019-06-28 NOTE — Telephone Encounter (Signed)
Pt called requesting a refill of hydrochlorothiazide 25 mg. Pt has already received 2 courtesy #30 day refills. 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 Pt agreeable to come in for an OV tomorrow with Samuel Bouche, NP in order to get a refill and was informed that she will need to schedule an OV to establish care with a new PCP when she checks out. Pt states she took her last hydrochlorothiazide today and will wait until her OV tomorrow to get the refill so she can get a whole month supply for the same co-pay. Pt verbalized understanding.

## 2019-06-29 ENCOUNTER — Other Ambulatory Visit: Payer: Self-pay

## 2019-06-29 ENCOUNTER — Ambulatory Visit (INDEPENDENT_AMBULATORY_CARE_PROVIDER_SITE_OTHER): Payer: BC Managed Care – PPO | Admitting: Medical-Surgical

## 2019-06-29 ENCOUNTER — Encounter: Payer: Self-pay | Admitting: Medical-Surgical

## 2019-06-29 VITALS — BP 115/71 | HR 61 | Temp 97.6°F | Ht 65.0 in | Wt 164.1 lb

## 2019-06-29 DIAGNOSIS — F909 Attention-deficit hyperactivity disorder, unspecified type: Secondary | ICD-10-CM

## 2019-06-29 DIAGNOSIS — I10 Essential (primary) hypertension: Secondary | ICD-10-CM

## 2019-06-29 MED ORDER — AMPHETAMINE-DEXTROAMPHETAMINE 10 MG PO TABS: 10.0000 mg | ORAL_TABLET | Freq: Two times a day (BID) | ORAL | 0 refills | Status: DC

## 2019-06-29 MED ORDER — AMPHETAMINE-DEXTROAMPHETAMINE 5 MG PO TABS: 5.0000 mg | ORAL_TABLET | Freq: Two times a day (BID) | ORAL | 0 refills | Status: DC

## 2019-06-29 MED ORDER — HYDROCHLOROTHIAZIDE 25 MG PO TABS: 25.0000 mg | ORAL_TABLET | Freq: Every day | ORAL | 3 refills | Status: DC

## 2019-06-29 MED ORDER — ATENOLOL 50 MG PO TABS: 50.0000 mg | ORAL_TABLET | Freq: Every day | ORAL | 3 refills | Status: DC

## 2019-06-29 NOTE — Assessment & Plan Note (Addendum)
Blood pressure well controlled with atenolol and hydrochlorothiazide daily.  Refills of both medications sent to pharmacy. Emphasized the need to set up an appointment to establish care with a new PCP for annual physical exam and blood work as soon as possible.

## 2019-06-29 NOTE — Assessment & Plan Note (Signed)
Adderall prescriptions refilled for 1 month.  Needs to schedule follow-up with new PCP for further refills.

## 2019-06-29 NOTE — Progress Notes (Signed)
Subjective:    CC: Blood pressure check  HPI:  Pleasant 53 year old female presenting today for blood pressure check and refill on hypertension medications.  Has been doing well with no chest pain, shortness of breath, headaches, or swelling.  Taking her medication daily without difficulty.  Checks her blood pressure at home with readings in the 110s over 70s usually.  Also requesting refill of Adderall for adult ADHD. Has been out for a while but reports the medication helps her especially with work where she needs to focus take her time doing tasks.  I reviewed the past medical history, family history, social history, surgical history, and allergies today and no changes were needed.  Please see the problem list section below in epic for further details.  Past Medical History: Past Medical History:  Diagnosis Date  . Anxiety   . Broken neck ([place])    c-7  . GERD (gastroesophageal reflux disease)   . Hypertension   . Ovarian cyst    Past Surgical History: Past Surgical History:  Procedure Laterality Date  . CARPAL TUNNEL RELEASE Left   . COLONOSCOPY     about 5 years ago around 2015. Possibly [place]  . ESOPHAGOGASTRODUODENOSCOPY     about 7 years ago per patient. Around 2013  . esure     . TUBAL LIGATION     Social History: Social History   Socioeconomic History  . Marital status: Married    Spouse name: Not on file  . Number of children: Not on file  . Years of education: Not on file  . Highest education level: Not on file  Occupational History  . Occupation: HR /[employer]  Tobacco Use  . Smoking status: Former Smoker    Quit date: 04/24/2010    Years since quitting: 9.1  . Smokeless tobacco: Never Used  Substance and Sexual Activity  . Alcohol use: Yes    Comment: occasional  . Drug use: No  . Sexual activity: Yes    Partners: Male    Birth control/protection: Surgical    Comment: Essure  Other Topics Concern  . Not on file  Social History  Narrative  . Not on file   Social Determinants of Health   Financial Resource Strain:   . Difficulty of Paying Living Expenses: Not on file  Food Insecurity:   . Worried About Charity fundraiser in the Last Year: Not on file  . Ran Out of Food in the Last Year: Not on file  Transportation Needs:   . Lack of Transportation (Medical): Not on file  . Lack of Transportation (Non-Medical): Not on file  Physical Activity:   . Days of Exercise per Week: Not on file  . Minutes of Exercise per Session: Not on file  Stress:   . Feeling of Stress : Not on file  Social Connections:   . Frequency of Communication with Friends and Family: Not on file  . Frequency of Social Gatherings with Friends and Family: Not on file  . Attends Religious Services: Not on file  . Active Member of Clubs or Organizations: Not on file  . Attends Archivist Meetings: Not on file  . Marital Status: Not on file   Family History: Family History  Problem Relation Age of Onset  . Heart attack Mother   . Hypertension Mother   . Alzheimer's disease Father   . Hypertension Father   . Cancer Sister        cervical  . Colon  cancer Neg Hx   . Esophageal cancer Neg Hx    Allergies: Allergies  Allergen Reactions  . Chantix [Varenicline]     Dreams   Medications: See med rec.  Review of Systems: No fevers, chills, night sweats, weight loss, chest pain, or shortness of breath.   Objective:    General: Well Developed, well nourished, and in no acute distress.  Neuro: Alert and oriented x3.  HEENT: Normocephalic, atraumatic.  Skin: Warm and dry. Cardiac: Regular rate and rhythm, no murmurs rubs or gallops, no lower extremity edema.  Respiratory: Clear to auscultation bilaterally. Not using accessory muscles, speaking in full sentences. Psych: Normal thought content, speech patterns, judgment.   Impression and Recommendations:    Essential hypertension Blood pressure well controlled with  atenolol and hydrochlorothiazide daily.  Refills of both medications sent to pharmacy. Emphasized the need to set up an appointment to establish care with a new PCP for annual physical exam and blood work as soon as possible.  Adult ADHD Adderall prescriptions refilled for 1 month.  Needs to schedule follow-up with new PCP for further refills.  Return for appointment to establish PCP ASAP; needs annual physical exam and blood work. ___________________________________________ Clearnce Sorrel, DNP, APRN, FNP-BC [facility] [place]

## 2019-07-05 ENCOUNTER — Other Ambulatory Visit: Payer: Self-pay | Admitting: Physician Assistant

## 2019-07-05 DIAGNOSIS — I10 Essential (primary) hypertension: Secondary | ICD-10-CM

## 2019-07-27 ENCOUNTER — Ambulatory Visit (INDEPENDENT_AMBULATORY_CARE_PROVIDER_SITE_OTHER): Payer: BC Managed Care – PPO | Admitting: Osteopathic Medicine

## 2019-07-27 ENCOUNTER — Encounter: Payer: Self-pay | Admitting: Osteopathic Medicine

## 2019-07-27 ENCOUNTER — Other Ambulatory Visit: Payer: Self-pay

## 2019-07-27 VITALS — BP 118/71 | HR 61 | Temp 97.8°F | Wt 167.0 lb

## 2019-07-27 DIAGNOSIS — R7401 Elevation of levels of liver transaminase levels: Secondary | ICD-10-CM

## 2019-07-27 DIAGNOSIS — E782 Mixed hyperlipidemia: Secondary | ICD-10-CM

## 2019-07-27 DIAGNOSIS — F909 Attention-deficit hyperactivity disorder, unspecified type: Secondary | ICD-10-CM

## 2019-07-27 DIAGNOSIS — I10 Essential (primary) hypertension: Secondary | ICD-10-CM

## 2019-07-27 DIAGNOSIS — Z0000 Encounter for general adult medical examination without abnormal findings: Secondary | ICD-10-CM | POA: Diagnosis not present

## 2019-07-27 DIAGNOSIS — Z87891 Personal history of nicotine dependence: Secondary | ICD-10-CM | POA: Diagnosis not present

## 2019-07-27 DIAGNOSIS — Z23 Encounter for immunization: Secondary | ICD-10-CM

## 2019-07-27 MED ORDER — HYDROCHLOROTHIAZIDE 25 MG PO TABS: 25.0000 mg | ORAL_TABLET | Freq: Every day | ORAL | 3 refills | Status: DC

## 2019-07-27 MED ORDER — ATENOLOL 50 MG PO TABS: 50.0000 mg | ORAL_TABLET | Freq: Every day | ORAL | 3 refills | Status: DC

## 2019-07-27 MED ORDER — ALPRAZOLAM 0.5 MG PO TABS: 0.2500 mg | ORAL_TABLET | Freq: Two times a day (BID) | ORAL | 0 refills | Status: AC | PRN

## 2019-07-27 NOTE — Progress Notes (Signed)
HPI: Emily Ponce is a 54 y.o. female who  has a past medical history of Anxiety, Broken neck ([place]), GERD (gastroesophageal reflux disease), Hypertension, and Ovarian cyst.  she presents to [facility] today, 07/27/19,  for chief complaint of: Annual physical    Patient here for annual physical / wellness exam.  See preventive care reviewed as below.    Additional concerns today include:  Requests refill alprazolam     Past medical, surgical, social and family history reviewed:  Patient Active Problem List   Diagnosis Date Noted  . Transaminitis 09/08/2018  . Gouty arthritis of left great toe 08/24/2018  . Adult ADHD 08/10/2018  . Hypertension goal BP (blood pressure) < 130/80 08/10/2018  . Inattention 07/13/2018  . Slow transit constipation 04/13/2018  . Left hip pain 04/13/2018  . Menopausal vasomotor syndrome 10/02/2017  . Overweight (BMI 25.0-29.9) 10/02/2017  . Family history of heart attack 10/02/2017  . Precordial chest pain 10/02/2017  . History of cervical fracture 10/02/2017  . Allergic rhinitis due to allergen 12/22/2016  . Oral allergy syndrome 12/22/2016  . Mastalgia 06/25/2016  . Perimenopausal vasomotor symptoms 06/25/2016  . Cervical radiculitis 10/30/2015  . Ovarian mass 01/05/2015  . Fibroid 01/05/2015  . Hyperlipidemia 07/26/2014  . 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  . Essential hypertension 04/24/2014  . Chronic headaches 04/24/2014  . Generalized anxiety disorder 04/24/2014  . Chronic neck pain 04/24/2014  . Chronic cervical pain 01/20/2014  . Headache, migraine 07/17/2012    Past Surgical History:  Procedure Laterality Date  . CARPAL TUNNEL RELEASE Left   . COLONOSCOPY     about 5 years ago around 2015. Possibly [place]  . ESOPHAGOGASTRODUODENOSCOPY     about 7 years ago per patient. Around 2013  . esure     . TUBAL LIGATION      Social History   Tobacco Use  . Smoking status: Former  Smoker    Quit date: 04/24/2010    Years since quitting: 9.2  . Smokeless tobacco: Never Used  Substance Use Topics  . Alcohol use: Yes    Comment: occasional    Family History  Problem Relation Age of Onset  . Heart attack Mother   . Hypertension Mother   . Alzheimer's disease Father   . Hypertension Father   . Cancer Sister        cervical  . Colon cancer Neg Hx   . Esophageal cancer Neg Hx      Current medication list and allergy/intolerance information reviewed:    Current Outpatient Medications  Medication Sig Dispense Refill  . amphetamine-dextroamphetamine (ADDERALL) 10 MG tablet Take 1 tablet (10 mg total) by mouth 2 (two) times daily with breakfast and lunch. Take in addition to 5 mg tab for TDD of 15 mg bid 60 tablet 0  . amphetamine-dextroamphetamine (ADDERALL) 5 MG tablet Take 1 tablet (5 mg total) by mouth 2 (two) times daily with breakfast and lunch. Take in addition to 10 mg tab for TDD of 15 mg bid 60 tablet 0  . Ascorbic Acid (VITAMIN C PO) Take 500 Units by mouth daily.    Marland Kitchen atenolol (TENORMIN) 50 MG tablet Take 1 tablet (50 mg total) by mouth daily. 90 tablet 3  . BIOTIN 5000 PO Take 1 tablet by mouth daily.    . Flaxseed, Linseed, (FLAX SEEDS) POWD Take 1,000 mg by mouth daily.     . Glucosamine HCl (GLUCOSAMINE PO) Take 1 tablet by mouth daily.    Marland Kitchen  hydrochlorothiazide (HYDRODIURIL) 25 MG tablet Take 1 tablet (25 mg total) by mouth daily. 90 tablet 3  . HYDROcodone-acetaminophen (NORCO/VICODIN) 5-325 MG tablet Take 1 tablet by mouth every 6 (six) hours as needed. 10 tablet 0  . milk thistle 175 MG tablet Take 175 mg by mouth daily.    . Multiple Vitamins-Minerals (ZINC PO) Take by mouth.    . Omega-3 Fatty Acids (FISH OIL PO) Take 1 capsule by mouth daily.    Marland Kitchen omeprazole (PRILOSEC) 40 MG capsule Take 40 mg by mouth daily.     Marland Kitchen POTASSIUM PO Take 1 tablet by mouth daily.    Marland Kitchen ALPRAZolam (XANAX) 0.5 MG tablet Take 0.5-1 tablets (0.25-0.5 mg total) by mouth 2  (two) times daily as needed for anxiety. #30 MUST LAST 90 DAYS 30 tablet 0   No current facility-administered medications for this visit.    Allergies  Allergen Reactions  . Chantix [Varenicline]     Dreams      Review of Systems:  Constitutional:  No  fever, no chills, No recent illness  HEENT: No  headache, no vision change  Cardiac: No  chest pain, No  pressure, No palpitations, No  Orthopnea  Respiratory:  No  shortness of breath. No  Cough  Gastrointestinal: No  abdominal pain,  Musculoskeletal: No new myalgia/arthralgia  Skin: No  Rash  Hem/Onc: No  easy bruising/bleeding,  Neurologic: No  weakness, No  dizziness,  Psychiatric: No  concerns with depression, No  concerns with anxiety,  Exam:  BP 118/71 (BP Location: Left Arm, Patient Position: Sitting, Cuff Size: Normal)   Pulse 61   Temp 97.8 F (36.6 C) (Oral)   Wt 167 lb 0.6 oz (75.8 kg)   LMP  (LMP Unknown) Comment: LMP 2017  BMI 27.80 kg/m   Constitutional: VS see above. General Appearance: alert, well-developed, well-nourished, NAD  Eyes: Normal lids and conjunctive, non-icteric sclera  Ears, Nose, Mouth, Throat:  TM normal bilaterally.   Neck: No masses, trachea midline. No thyroid enlargement. No tenderness/mass appreciated. No lymphadenopathy  Respiratory: Normal respiratory effort. no wheeze, no rhonchi, no rales  Cardiovascular: S1/S2 normal, no murmur, no rub/gallop auscultated. RRR. No lower extremity edema.   Gastrointestinal: Nontender, no masses. No hepatomegaly, no splenomegaly. No hernia appreciated. Bowel sounds normal. Rectal exam deferred.   Musculoskeletal: Gait normal. No clubbing/cyanosis of digits.   Neurological: Normal balance/coordination. No tremor.  Skin: warm, dry, intact. No rash/ulcer. No concerning nevi or subq nodules on limited exam.    Psychiatric: Normal judgment/insight. Normal mood and affect. Oriented x3.    No results found for this or any previous visit  (from the past 72 hour(s)).  No results found.   ASSESSMENT/PLAN: The primary encounter diagnosis was Annual physical exam. Diagnoses of History of cigarette smoking, Essential hypertension, Mixed hyperlipidemia, Adult ADHD, Transaminitis, and Need for shingles vaccine were also pertinent to this visit.   Orders Placed This Encounter  Procedures  . Varicella-zoster vaccine IM (Shingrix)  . CBC  . COMPLETE METABOLIC PANEL WITH GFR  . Lipid panel    Meds ordered this encounter  Medications  . hydrochlorothiazide (HYDRODIURIL) 25 MG tablet    Sig: Take 1 tablet (25 mg total) by mouth daily.    Dispense:  90 tablet    Refill:  3  . atenolol (TENORMIN) 50 MG tablet    Sig: Take 1 tablet (50 mg total) by mouth daily.    Dispense:  90 tablet    Refill:  3  .  ALPRAZolam (XANAX) 0.5 MG tablet    Sig: Take 0.5-1 tablets (0.25-0.5 mg total) by mouth 2 (two) times daily as needed for anxiety. #30 MUST LAST 90 DAYS    Dispense:  30 tablet    Refill:  0    Not to exceed 5 additional fills before 06/09/2015    Patient Instructions  General Preventive Care  Most recent routine screening lipids/other labs: due!  Everyone should have blood pressure checked once per year.   Tobacco: don't!   Alcohol: responsible moderation is ok for most adults - if you have concerns about your alcohol intake, please talk to me!   Exercise: as tolerated to reduce risk of cardiovascular disease and diabetes. Strength training will also prevent osteoporosis.   Mental health: if need for mental health care (medicines, counseling, other), or concerns about moods, please let me know!   Sexual health: if need for STD testing, or if concerns with libido/pain problems, please let me know!   Advanced Directive: Living Will and/or Healthcare Power of Attorney recommended for all adults, regardless of age or health.  Vaccines  Flu vaccine: recommended for almost everyone, every fall.   Shingles vaccine:  Shingrix recommended after age 107. First shot today, second in 2-6 months.   Pneumonia vaccines: Prevnar and Pneumovax recommended after age 80, or sooner if certain medical conditions.  Tetanus booster: Tdap recommended every 10 years. Due 2026.  Cancer screenings   Colon cancer screening: due 2025  Breast cancer screening: mammogram recommended annually after age 75. Due 05/2020  Cervical cancer screening: Pap due 07/2020.  Lung cancer screening: CT chest every year for those age 56 to 56 years old with ?30 pack year smoking history, who either currently smoke or have quit within the past 15 years. Infection screenings . HIV: recommended screening at least once age 6-65, more often as needed. . Gonorrhea/Chlamydia: screening as needed . Hepatitis C: recommended once for anyone born 07-1965. Done 11/2018.  . TB: certain at-risk populations, or depending on work requirements and/or travel history Other . Bone Density Test: recommended for women at age 52        Visit summary with medication list and pertinent instructions was printed for patient to review. All questions at time of visit were answered - patient instructed to contact office with any additional concerns or updates. ER/RTC precautions were reviewed with the patient.     Please note: voice recognition software was used to produce this document, and typos may escape review. Please contact Dr. Sheppard Coil for any needed clarifications.     Follow-up plan: Return in about 3 months (around 10/25/2019) for office w/ Dr Sheppard Coil - maintain ADD Rx, second shingles vaccine .

## 2019-07-27 NOTE — Patient Instructions (Addendum)
General Preventive Care  Most recent routine screening lipids/other labs: due!  Everyone should have blood pressure checked once per year.   Tobacco: don't!   Alcohol: responsible moderation is ok for most adults - if you have concerns about your alcohol intake, please talk to me!   Exercise: as tolerated to reduce risk of cardiovascular disease and diabetes. Strength training will also prevent osteoporosis.   Mental health: if need for mental health care (medicines, counseling, other), or concerns about moods, please let me know!   Sexual health: if need for STD testing, or if concerns with libido/pain problems, please let me know!   Advanced Directive: Living Will and/or Healthcare Power of Attorney recommended for all adults, regardless of age or health.  Vaccines  Flu vaccine: recommended for almost everyone, every fall.   Shingles vaccine: Shingrix recommended after age 37. First shot today, second in 2-6 months.   Pneumonia vaccines: Prevnar and Pneumovax recommended after age 49, or sooner if certain medical conditions.  Tetanus booster: Tdap recommended every 10 years. Due 2026.  Cancer screenings   Colon cancer screening: due 2025  Breast cancer screening: mammogram recommended annually after age 76. Due 05/2020  Cervical cancer screening: Pap due 07/2020.  Lung cancer screening: CT chest every year for those age 32 to 56 years old with ?30 pack year smoking history, who either currently smoke or have quit within the past 15 years. Infection screenings . HIV: recommended screening at least once age 45-65, more often as needed. . Gonorrhea/Chlamydia: screening as needed . Hepatitis C: recommended once for anyone born 62-1967. Done 11/2018.  . TB: certain at-risk populations, or depending on work requirements and/or travel history Other . Bone Density Test: recommended for women at age 23

## 2019-07-28 LAB — CBC
HCT: 42.4 % (ref 35.0–45.0)
Hemoglobin: 14.3 g/dL (ref 11.7–15.5)
MCH: 30.9 pg (ref 27.0–33.0)
MCHC: 33.7 g/dL (ref 32.0–36.0)
MCV: 91.6 fL (ref 80.0–100.0)
MPV: 11.7 fL (ref 7.5–12.5)
Platelets: 234 10*3/uL (ref 140–400)
RBC: 4.63 10*6/uL (ref 3.80–5.10)
RDW: 12.3 % (ref 11.0–15.0)
WBC: 7.2 10*3/uL (ref 3.8–10.8)

## 2019-07-28 LAB — LIPID PANEL
Cholesterol: 237 mg/dL — ABNORMAL HIGH (ref <200)
HDL: 60 mg/dL (ref >=50)
LDL Cholesterol (Calc): 151 mg/dL (calc) — ABNORMAL HIGH
Non-HDL Cholesterol (Calc): 177 mg/dL (calc) — ABNORMAL HIGH (ref <130)
Total CHOL/HDL Ratio: 4 (calc) (ref <5.0)
Triglycerides: 131 mg/dL (ref <150)

## 2019-07-28 LAB — COMPLETE METABOLIC PANEL WITH GFR
AG Ratio: 2.1 (calc) (ref 1.0–2.5)
ALT: 49 U/L — ABNORMAL HIGH (ref 6–29)
AST: 32 U/L (ref 10–35)
Albumin: 4.6 g/dL (ref 3.6–5.1)
Alkaline phosphatase (APISO): 63 U/L (ref 37–153)
BUN: 18 mg/dL (ref 7–25)
CO2: 34 mmol/L — ABNORMAL HIGH (ref 20–32)
Calcium: 9.6 mg/dL (ref 8.6–10.4)
Chloride: 102 mmol/L (ref 98–110)
Creat: 0.84 mg/dL (ref 0.50–1.05)
GFR, Est African American: 91 mL/min/{1.73_m2} (ref >=60)
GFR, Est Non African American: 78 mL/min/{1.73_m2} (ref >=60)
Globulin: 2.2 g/dL (calc) (ref 1.9–3.7)
Glucose, Bld: 95 mg/dL (ref 65–99)
Potassium: 4 mmol/L (ref 3.5–5.3)
Sodium: 144 mmol/L (ref 135–146)
Total Bilirubin: 0.5 mg/dL (ref 0.2–1.2)
Total Protein: 6.8 g/dL (ref 6.1–8.1)

## 2019-09-01 DIAGNOSIS — Z713 Dietary counseling and surveillance: Secondary | ICD-10-CM | POA: Diagnosis not present

## 2019-09-23 ENCOUNTER — Other Ambulatory Visit: Payer: Self-pay | Admitting: Sports Medicine

## 2019-09-23 DIAGNOSIS — M109 Gout, unspecified: Secondary | ICD-10-CM

## 2019-09-23 MED ORDER — ALLOPURINOL 300 MG PO TABS: 300.0000 mg | ORAL_TABLET | Freq: Every day | ORAL | 0 refills | Status: DC

## 2019-10-24 ENCOUNTER — Ambulatory Visit: Payer: BC Managed Care – PPO | Admitting: Osteopathic Medicine

## 2019-10-25 ENCOUNTER — Ambulatory Visit (INDEPENDENT_AMBULATORY_CARE_PROVIDER_SITE_OTHER): Payer: BC Managed Care – PPO | Admitting: Osteopathic Medicine

## 2019-10-25 ENCOUNTER — Telehealth: Payer: Self-pay

## 2019-10-25 ENCOUNTER — Encounter: Payer: Self-pay | Admitting: Osteopathic Medicine

## 2019-10-25 VITALS — BP 105/69 | HR 62 | Temp 98.3°F | Wt 167.0 lb

## 2019-10-25 DIAGNOSIS — I10 Essential (primary) hypertension: Secondary | ICD-10-CM | POA: Diagnosis not present

## 2019-10-25 DIAGNOSIS — Z23 Encounter for immunization: Secondary | ICD-10-CM | POA: Diagnosis not present

## 2019-10-25 DIAGNOSIS — M7581 Other shoulder lesions, right shoulder: Secondary | ICD-10-CM | POA: Diagnosis not present

## 2019-10-25 DIAGNOSIS — F909 Attention-deficit hyperactivity disorder, unspecified type: Secondary | ICD-10-CM | POA: Diagnosis not present

## 2019-10-25 MED ORDER — MELOXICAM 15 MG PO TABS: ORAL_TABLET | ORAL | 3 refills | Status: DC

## 2019-10-25 MED ORDER — HYDROCODONE-ACETAMINOPHEN 5-325 MG PO TABS: 1.0000 | ORAL_TABLET | Freq: Four times a day (QID) | ORAL | 0 refills | Status: DC | PRN

## 2019-10-25 MED ORDER — AMPHETAMINE-DEXTROAMPHETAMINE 10 MG PO TABS: 10.0000 mg | ORAL_TABLET | Freq: Two times a day (BID) | ORAL | 0 refills | Status: DC

## 2019-10-25 MED ORDER — ALLOPURINOL 300 MG PO TABS: 300.0000 mg | ORAL_TABLET | Freq: Every day | ORAL | 3 refills | Status: DC

## 2019-10-25 NOTE — Progress Notes (Signed)
Emily Ponce is a 54 y.o. female who presents to  [place] at [facility]  today, 10/25/19, seeking care for the following: . ADD rx . Shingles vaccine #2 (#1 was 07/27/19) . R shoulder pain - new problem       ASSESSMENT & PLAN with other pertinent history/findings:  The primary encounter diagnosis was Adult ADHD. Diagnoses of Essential hypertension, Need for shingles vaccine, and Right rotator cuff tendinitis were also pertinent to this visit.  1. Adult ADHD Stable Taking 10 mg daily, usually only needs this Refilled 10 mg today  2. Essential hypertension Looks good on current Rx  3. Need for shingles vaccine Done #2 today Confirmed NOT within 14 days of COVID vaccine  4. Right rotator cuff tendinitis PT referral in On physical exam - limited d/t pain, (+)drop arm and (+)crossover, (+)ant/post apprehension  Sports med if no better     Patient Instructions  For shoulder, I have sent in some prescription strength anti-inflammatories and some heavier opiate pain medications to take sparingly as needed.  I have sent a referral for physical therapy.  Would recommend steroid burst if you are able to tolerate it, if you change your mind and would like a prescription, let me know!  If physical therapy is not helpful after a couple of weeks, please call our office to schedule a visit with our sports medicine specialist, Dr. Darene Lamer.  For ADD medications, continue taking it as you are.  Let me know when you are due for refills.  I sent a refill of the 10 mg today.  Since blood pressure has been looking good and you are doing okay on the medications, I think we can follow-up on this every 6 months or so just to keep an eye on blood pressure.      Orders Placed This Encounter  Procedures  . Varicella-zoster vaccine IM (Shingrix)  . Ambulatory referral to Physical Therapy    Meds ordered this encounter  Medications  .  HYDROcodone-acetaminophen (NORCO/VICODIN) 5-325 MG tablet    Sig: Take 1 tablet by mouth every 6 (six) hours as needed.    Dispense:  10 tablet    Refill:  0  . meloxicam (MOBIC) 15 MG tablet    Sig: One tab PO qAM with breakfast for 2 weeks, then daily prn pain.    Dispense:  30 tablet    Refill:  3  . amphetamine-dextroamphetamine (ADDERALL) 10 MG tablet    Sig: Take 1 tablet (10 mg total) by mouth 2 (two) times daily with breakfast and lunch. Take in addition to 5 mg tab for TDD of 15 mg bid    Dispense:  60 tablet    Refill:  0       Follow-up instructions: Return in about 6 months (around 04/25/2020) for IN-OFFICE VISIT blood pressure check on ADD medications, maintain Rx.                                         BP 105/69 (BP Location: Left Arm, Patient Position: Sitting, Cuff Size: Normal)   Pulse 62   Temp 98.3 F (36.8 C) (Oral)   Wt 167 lb 0.6 oz (75.8 kg)   LMP  (LMP Unknown) Comment: LMP 2017  BMI 27.80 kg/m   Current Meds  Medication Sig  . allopurinol (ZYLOPRIM) 300 MG tablet Take 1  tablet (300 mg total) by mouth daily.  Marland Kitchen ALPRAZolam (XANAX) 0.5 MG tablet Take 0.5-1 tablets (0.25-0.5 mg total) by mouth 2 (two) times daily as needed for anxiety. #30 MUST LAST 90 DAYS  . amphetamine-dextroamphetamine (ADDERALL) 10 MG tablet Take 1 tablet (10 mg total) by mouth 2 (two) times daily with breakfast and lunch. Take in addition to 5 mg tab for TDD of 15 mg bid  . amphetamine-dextroamphetamine (ADDERALL) 5 MG tablet Take 1 tablet (5 mg total) by mouth 2 (two) times daily with breakfast and lunch. Take in addition to 10 mg tab for TDD of 15 mg bid  . Ascorbic Acid (VITAMIN C PO) Take 500 Units by mouth daily.  Marland Kitchen atenolol (TENORMIN) 50 MG tablet Take 1 tablet (50 mg total) by mouth daily.  Marland Kitchen BIOTIN 5000 PO Take 1 tablet by mouth daily.  . Flaxseed, Linseed, (FLAX SEEDS) POWD Take 1,000 mg by mouth daily.   . Glucosamine HCl (GLUCOSAMINE  PO) Take 1 tablet by mouth daily.  . hydrochlorothiazide (HYDRODIURIL) 25 MG tablet Take 1 tablet (25 mg total) by mouth daily.  Marland Kitchen HYDROcodone-acetaminophen (NORCO/VICODIN) 5-325 MG tablet Take 1 tablet by mouth every 6 (six) hours as needed.  . milk thistle 175 MG tablet Take 175 mg by mouth daily.  . Multiple Vitamins-Minerals (ZINC PO) Take by mouth.  . Omega-3 Fatty Acids (FISH OIL PO) Take 1 capsule by mouth daily.  Marland Kitchen omeprazole (PRILOSEC) 40 MG capsule Take 40 mg by mouth daily.   Marland Kitchen POTASSIUM PO Take 1 tablet by mouth daily.  . [DISCONTINUED] amphetamine-dextroamphetamine (ADDERALL) 10 MG tablet Take 1 tablet (10 mg total) by mouth 2 (two) times daily with breakfast and lunch. Take in addition to 5 mg tab for TDD of 15 mg bid  . [DISCONTINUED] HYDROcodone-acetaminophen (NORCO/VICODIN) 5-325 MG tablet Take 1 tablet by mouth every 6 (six) hours as needed.    No results found for this or any previous visit (from the past 72 hour(s)).  No results found.  Depression screen [name] 2/9 10/25/2019 07/13/2018 04/13/2018  Decreased Interest 0 0 0  Down, Depressed, Hopeless 0 0 0  PHQ - 2 Score 0 0 0  Altered sleeping 0 - -  Tired, decreased energy 0 - -  Change in appetite 0 - -  Feeling bad or failure about yourself  0 - -  Trouble concentrating 0 - -  Moving slowly or fidgety/restless 0 - -  Suicidal thoughts 0 - -  PHQ-9 Score 0 - -    GAD 7 : Generalized Anxiety Score 10/25/2019 07/13/2018 12/07/2016  Nervous, Anxious, on Edge 0 0 0  Control/stop worrying 0 1 0  Worry too much - different things 0 1 0  Trouble relaxing 0 0 0  Restless 0 0 0  Easily annoyed or irritable 0 0 0  Afraid - awful might happen 0 0 0  Total GAD 7 Score 0 2 0  Anxiety Difficulty - Somewhat difficult -      All questions at time of visit were answered - patient instructed to contact office with any additional concerns or updates.  ER/RTC precautions were reviewed with the patient.  Please note: voice  recognition software was used to produce this document, and typos may escape review. Please contact Dr. Sheppard Coil for any needed clarifications.

## 2019-10-25 NOTE — Patient Instructions (Addendum)
For shoulder, I have sent in some prescription strength anti-inflammatories and some heavier opiate pain medications to take sparingly as needed.  I have sent a referral for physical therapy.  Would recommend steroid burst if you are able to tolerate it, if you change your mind and would like a prescription, let me know!  If physical therapy is not helpful after a couple of weeks, please call our office to schedule a visit with our sports medicine specialist, Dr. Darene Lamer.  For ADD medications, continue taking it as you are.  Let me know when you are due for refills.  I sent a refill of the 10 mg today.  Since blood pressure has been looking good and you are doing okay on the medications, I think we can follow-up on this every 6 months or so just to keep an eye on blood pressure.

## 2019-10-25 NOTE — Telephone Encounter (Signed)
[facility] pharmacy requesting med refill for allopurinol. Rx written by Dr. Darene Lamer. As per Dr. Darene Lamer " I haven't seen her in >a year, does provider want to take over allopurinol? Pls advise, whether appropriate to refill rx. Thanks.

## 2019-12-07 DIAGNOSIS — H6121 Impacted cerumen, right ear: Secondary | ICD-10-CM | POA: Diagnosis not present

## 2020-01-30 ENCOUNTER — Other Ambulatory Visit: Payer: Self-pay

## 2020-01-30 DIAGNOSIS — E782 Mixed hyperlipidemia: Secondary | ICD-10-CM

## 2020-01-30 MED ORDER — OMEPRAZOLE 40 MG PO CPDR: 40.0000 mg | DELAYED_RELEASE_CAPSULE | Freq: Every day | ORAL | 3 refills | Status: DC

## 2020-01-30 NOTE — Telephone Encounter (Signed)
Medication refill request Last Ov- 10/26/2019 Last refill- 10/25/2018 from historical provider,  I checked note to see if this medication should be stopped or continued, I saw neither. Please advise. Medication pended for 30 days 1 refill.

## 2020-02-06 ENCOUNTER — Other Ambulatory Visit: Payer: Self-pay | Admitting: Osteopathic Medicine

## 2020-02-06 DIAGNOSIS — F909 Attention-deficit hyperactivity disorder, unspecified type: Secondary | ICD-10-CM

## 2020-02-06 MED ORDER — AMPHETAMINE-DEXTROAMPHETAMINE 10 MG PO TABS: 10.0000 mg | ORAL_TABLET | Freq: Two times a day (BID) | ORAL | 0 refills | Status: DC

## 2020-02-06 NOTE — Telephone Encounter (Signed)
Last Ov- 10/25/2019  Last refill- 10/25/2019

## 2020-02-10 DIAGNOSIS — H9202 Otalgia, left ear: Secondary | ICD-10-CM | POA: Diagnosis not present

## 2020-02-17 ENCOUNTER — Ambulatory Visit (INDEPENDENT_AMBULATORY_CARE_PROVIDER_SITE_OTHER): Payer: BC Managed Care – PPO | Admitting: Medical-Surgical

## 2020-02-17 ENCOUNTER — Encounter: Payer: Self-pay | Admitting: Medical-Surgical

## 2020-02-17 VITALS — BP 125/77 | HR 64 | Temp 98.1°F | Ht 65.0 in | Wt 165.5 lb

## 2020-02-17 DIAGNOSIS — M109 Gout, unspecified: Secondary | ICD-10-CM | POA: Diagnosis not present

## 2020-02-17 MED ORDER — HYDROCODONE-ACETAMINOPHEN 5-325 MG PO TABS: 1.0000 | ORAL_TABLET | Freq: Four times a day (QID) | ORAL | 0 refills | Status: DC | PRN

## 2020-02-17 NOTE — Progress Notes (Signed)
Subjective:    CC: left great toe pain/redness/swelling  HPI: Pleasant 54 year old female presenting today with reports of 2 days of left great toe pain with redness and swelling.  History of podagra and the left MTP joint.  Reports her current symptoms are consistent with her initial gout flare.  Has been eating red meat regularly and drinking beer.  Was prescribed allopurinol but has only been taking this intermittently.  When pain developed, she began taking her allopurinol daily as prescribed.  Has been taking ibuprofen, naproxen, aspirin with minimal relief.  With her initial flare, she was prescribed colchicine and indomethacin but did not take either of these.  She still has these prescriptions at home.  Just finished a prednisone taper yesterday for an ENT problem.  I reviewed the past medical history, family history, social history, surgical history, and allergies today and no changes were needed.  Please see the problem list section below in epic for further details.  Past Medical History: Past Medical History:  Diagnosis Date  . Anxiety   . Broken neck ([place])    c-7  . GERD (gastroesophageal reflux disease)   . Hypertension   . Ovarian cyst    Past Surgical History: Past Surgical History:  Procedure Laterality Date  . CARPAL TUNNEL RELEASE Left   . COLONOSCOPY     about 5 years ago around 2015. Possibly [place]  . ESOPHAGOGASTRODUODENOSCOPY     about 7 years ago per patient. Around 2013  . esure     . TUBAL LIGATION     Social History: Social History   Socioeconomic History  . Marital status: Married    Spouse name: Not on file  . Number of children: Not on file  . Years of education: Not on file  . Highest education level: Not on file  Occupational History  . Occupation: HR /[employer]  Tobacco Use  . Smoking status: Former Smoker    Quit date: 04/24/2010    Years since quitting: 9.8  . Smokeless tobacco: Never Used  Vaping Use  . Vaping Use: Never  used  Substance and Sexual Activity  . Alcohol use: Yes    Comment: occasional  . Drug use: No  . Sexual activity: Yes    Partners: Male    Birth control/protection: Surgical    Comment: Essure  Other Topics Concern  . Not on file  Social History Narrative  . Not on file   Social Determinants of Health   Financial Resource Strain:   . Difficulty of Paying Living Expenses:   Food Insecurity:   . Worried About Charity fundraiser in the Last Year:   . Arboriculturist in the Last Year:   Transportation Needs:   . Film/video editor (Medical):   Marland Kitchen Lack of Transportation (Non-Medical):   Physical Activity:   . Days of Exercise per Week:   . Minutes of Exercise per Session:   Stress:   . Feeling of Stress :   Social Connections:   . Frequency of Communication with Friends and Family:   . Frequency of Social Gatherings with Friends and Family:   . Attends Religious Services:   . Active Member of Clubs or Organizations:   . Attends Archivist Meetings:   Marland Kitchen Marital Status:    Family History: Family History  Problem Relation Age of Onset  . Heart attack Mother   . Hypertension Mother   . Alzheimer's disease Father   . Hypertension Father   .  Cancer Sister        cervical  . Colon cancer Neg Hx   . Esophageal cancer Neg Hx    Allergies: Allergies  Allergen Reactions  . Chantix [Varenicline]     Dreams   Medications: See med rec.  Review of Systems: No fevers, chills, night sweats, weight loss, chest pain, or shortness of breath.   Objective:    General: Well Developed, well nourished, and in no acute distress.  Neuro: Alert and oriented x3.  HEENT: Normocephalic, atraumatic.  Skin: Warm and dry. Cardiac: Regular rate and rhythm, no murmurs rubs or gallops, no lower extremity edema.  Respiratory: Clear to auscultation bilaterally. Not using accessory muscles, speaking in full sentences. Left foot: Erythema and swelling across the forefoot but  greater at the great toe and first MTP joint.  Range of motion limited by pain.  Tenderness across forefoot, worse at the great toe and first MTP joint.  Gait altered due to pain.  Impression and Recommendations:    1. Gouty arthritis of left great toe/podagra Discussed causes of gout flares and recommendations for treatment.  As she just finished a prednisone taper yesterday, would prefer to not repeat this.  Offered Solu-Medrol or Depo-Medrol injection, patient declined at this time.  As she already has colchicine and indomethacin at home we will not send in new prescriptions but discussed proper dosing of both medications and strongly encouraged her to start both of these.  She also will need to take her allopurinol daily to prevent further flares.  Recommend wearing supportive shoes to help with pain when walking.  Handout sheet on conservative treatment provided with AVS.  Return if symptoms worsen or fail to improve. __________________________________________ Clearnce Sorrel, DNP, APRN, FNP-BC [facility] [place]

## 2020-02-17 NOTE — Patient Instructions (Addendum)
Colchicine- take one tab/capsule three times daily on day 1 then take 2 times daily until flare has resolved x 48 hours.  Indomethacin- use this as prescribed, avoid other ibuprofen medications  Allopurinol- take as prescribed every day  Norco- narcotic pain medication as needed as prescribed  Low-Purine Eating Plan A low-purine eating plan involves making food choices to limit your intake of purine. Purine is a kind of uric acid. Too much uric acid in your blood can cause certain conditions, such as gout and kidney stones. Eating a low-purine diet can help control these conditions. What are tips for following this plan? Reading food labels   Avoid foods with saturated or Trans fat.  Check the ingredient list of grains-based foods, such as bread and cereal, to make sure that they contain whole grains.  Check the ingredient list of sauces or soups to make sure they do not contain meat or fish.  When choosing soft drinks, check the ingredient list to make sure they do not contain high-fructose corn syrup. Shopping  Buy plenty of fresh fruits and vegetables.  Avoid buying canned or fresh fish.  Buy dairy products labeled as low-fat or nonfat.  Avoid buying premade or processed foods. These foods are often high in fat, salt (sodium), and added sugar. Cooking  Use olive oil instead of butter when cooking. Oils like olive oil, canola oil, and sunflower oil contain healthy fats. Meal planning  Learn which foods do or do not affect you. If you find out that a food tends to cause your gout symptoms to flare up, avoid eating that food. You can enjoy foods that do not cause problems. If you have any questions about a food item, talk with your dietitian or health care provider.  Limit foods high in fat, especially saturated fat. Fat makes it harder for your body to get rid of uric acid.  Choose foods that are lower in fat and are lean sources of protein. General guidelines  Limit  alcohol intake to no more than 1 drink a day for nonpregnant women and 2 drinks a day for men. One drink equals 12 oz of beer, 5 oz of wine, or 1 oz of hard liquor. Alcohol can affect the way your body gets rid of uric acid.  Drink plenty of water to keep your urine clear or pale yellow. Fluids can help remove uric acid from your body.  If directed by your health care provider, take a vitamin C supplement.  Work with your health care provider and dietitian to develop a plan to achieve or maintain a healthy weight. Losing weight can help reduce uric acid in your blood. What foods are recommended? The items listed may not be a complete list. Talk with your dietitian about what dietary choices are best for you. Foods low in purines Foods low in purines do not need to be limited. These include:  All fruits.  All low-purine vegetables, pickles, and olives.  Breads, pasta, rice, cornbread, and popcorn. Cake and other baked goods.  All dairy foods.  Eggs, nuts, and nut butters.  Spices and condiments, such as salt, herbs, and vinegar.  Plant oils, butter, and margarine.  Water, sugar-free soft drinks, tea, coffee, and cocoa.  Vegetable-based soups, broths, sauces, and gravies. Foods moderate in purines Foods moderate in purines should be limited to the amounts listed.   cup of asparagus, cauliflower, spinach, mushrooms, or green peas, each day.  2/3 cup uncooked oatmeal, each day.   cup dry  wheat bran or wheat germ, each day.  2-3 ounces of meat or poultry, each day.  4-6 ounces of shellfish, such as crab, lobster, oysters, or shrimp, each day.  1 cup cooked beans, peas, or lentils, each day.  Soup, broths, or bouillon made from meat or fish. Limit these foods as much as possible. What foods are not recommended? The items listed may not be a complete list. Talk with your dietitian about what dietary choices are best for you. Limit your intake of foods high in purines,  including:  Beer and other alcohol.  Meat-based gravy or sauce.  Canned or fresh fish, such as: ? Anchovies, sardines, herring, and tuna. ? Mussels and scallops. ? Codfish, trout, and haddock.  Berniece Salines.  Organ meats, such as: ? Liver or kidney. ? Tripe. ? Sweetbreads (thymus gland or pancreas).  Wild [employer] biochemist.  Yeast or yeast extract supplements.  Drinks sweetened with high-fructose corn syrup. Summary  Eating a low-purine diet can help control conditions caused by too much uric acid in the body, such as gout or kidney stones.  Choose low-purine foods, limit alcohol, and limit foods high in fat.  You will learn over time which foods do or do not affect you. If you find out that a food tends to cause your gout symptoms to flare up, avoid eating that food. This information is not intended to replace advice given to you by your health care provider. Make sure you discuss any questions you have with your health care provider. Document Revised: 06/05/2017 Document Reviewed: 08/06/2016 Elsevier Patient Education  2020 [employer].

## 2020-02-23 ENCOUNTER — Other Ambulatory Visit: Payer: Self-pay | Admitting: Osteopathic Medicine

## 2020-02-23 IMAGING — DX DG CERVICAL SPINE COMPLETE 4+V
6 series · 6 of 6 positions shown · non-contrast
Comparison: 10/30/2015.

CLINICAL DATA: Neck pain for several years. History of C7 fracture.

EXAM:
CERVICAL SPINE - COMPLETE 4+ VIEW

[c-spine lat]
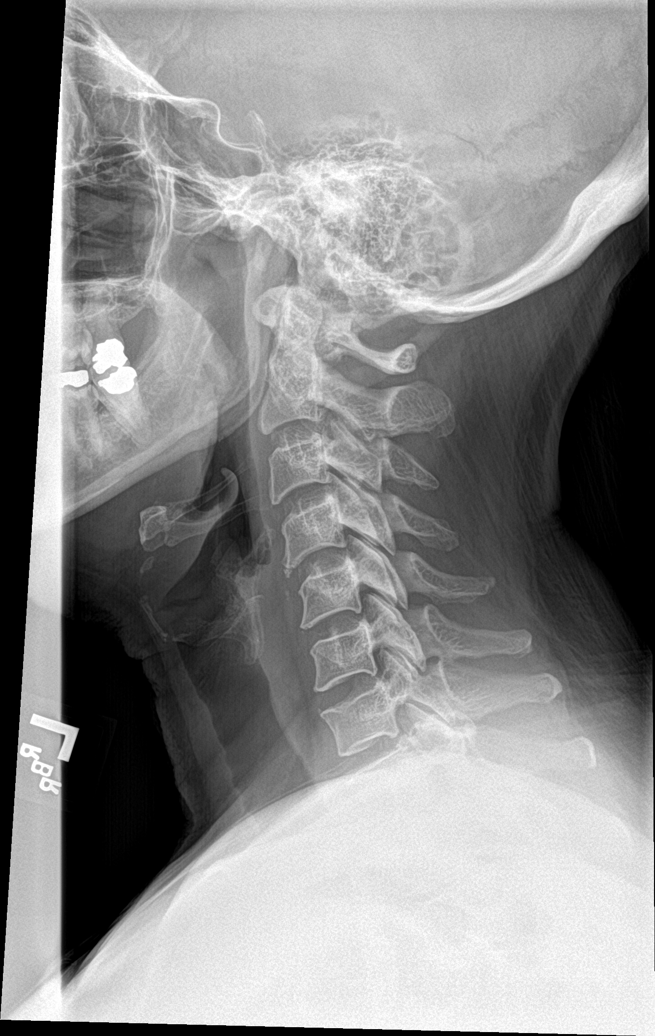

[c-spine obl (1 of 2)]
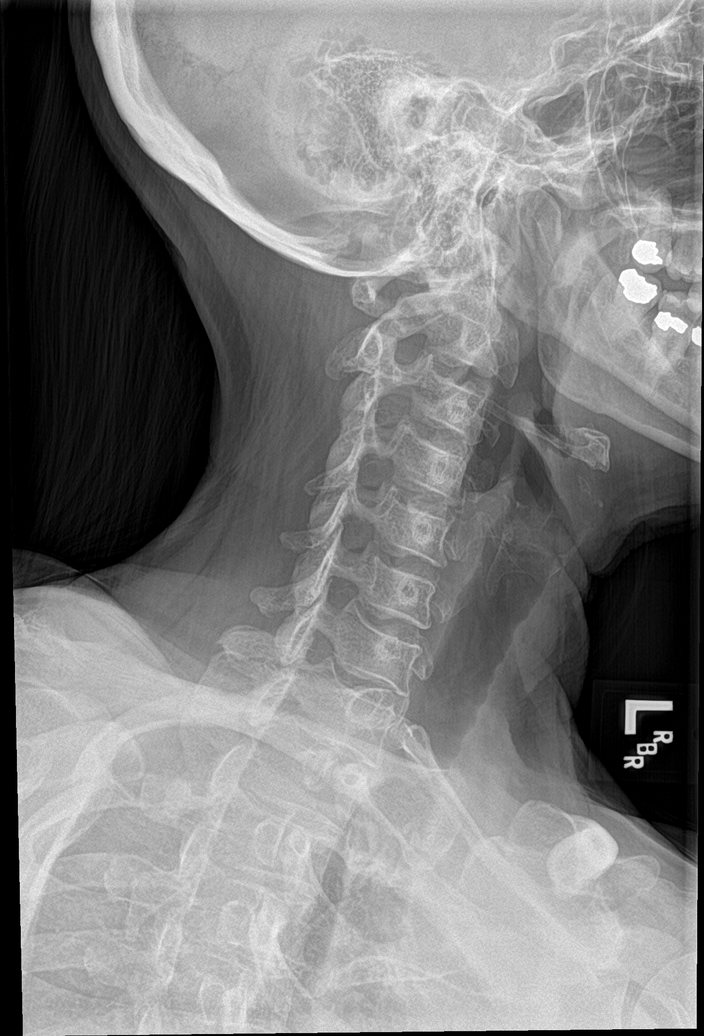

[c-spine obl (2 of 2)]
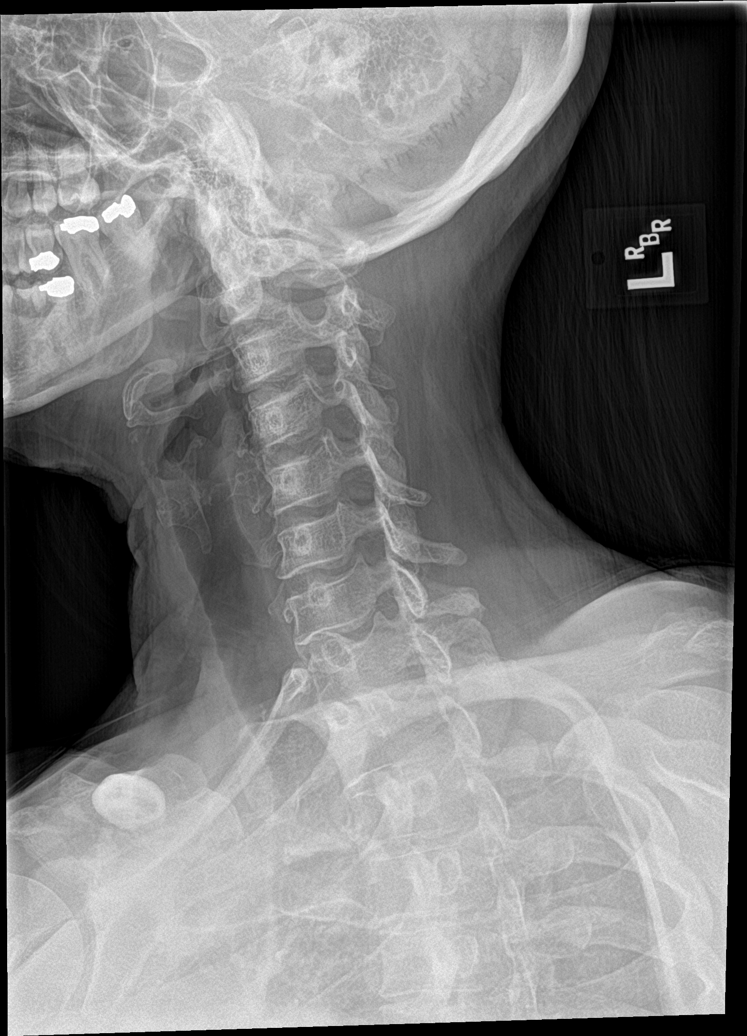

[c-spine ap]
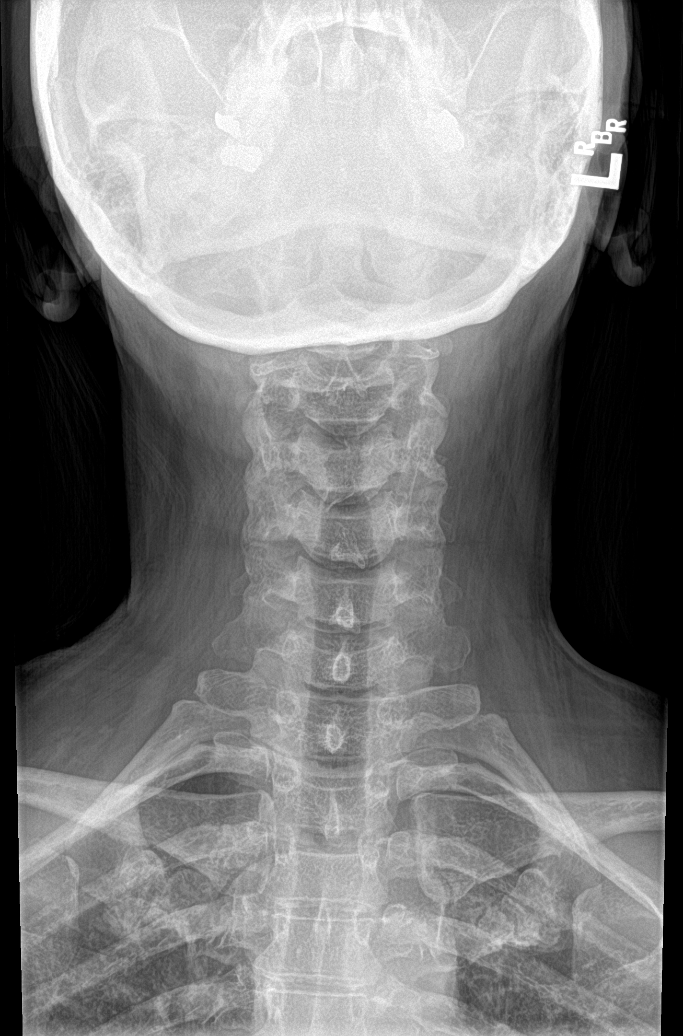

[c-spine open mouth]
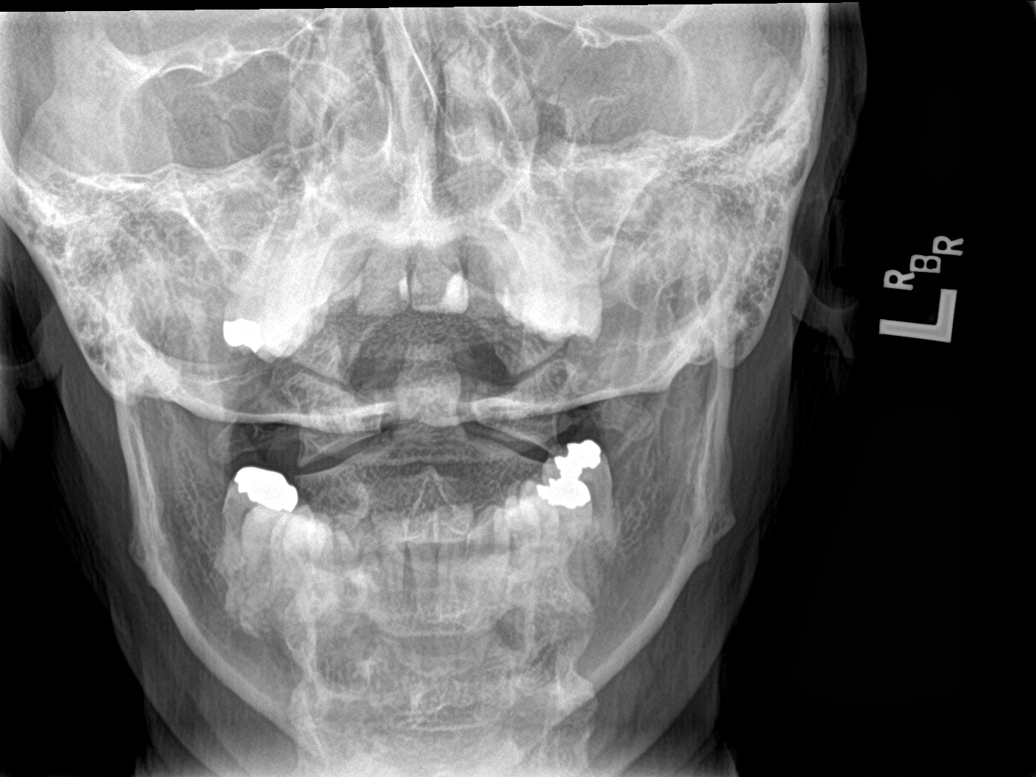

[c-spine swimmers]
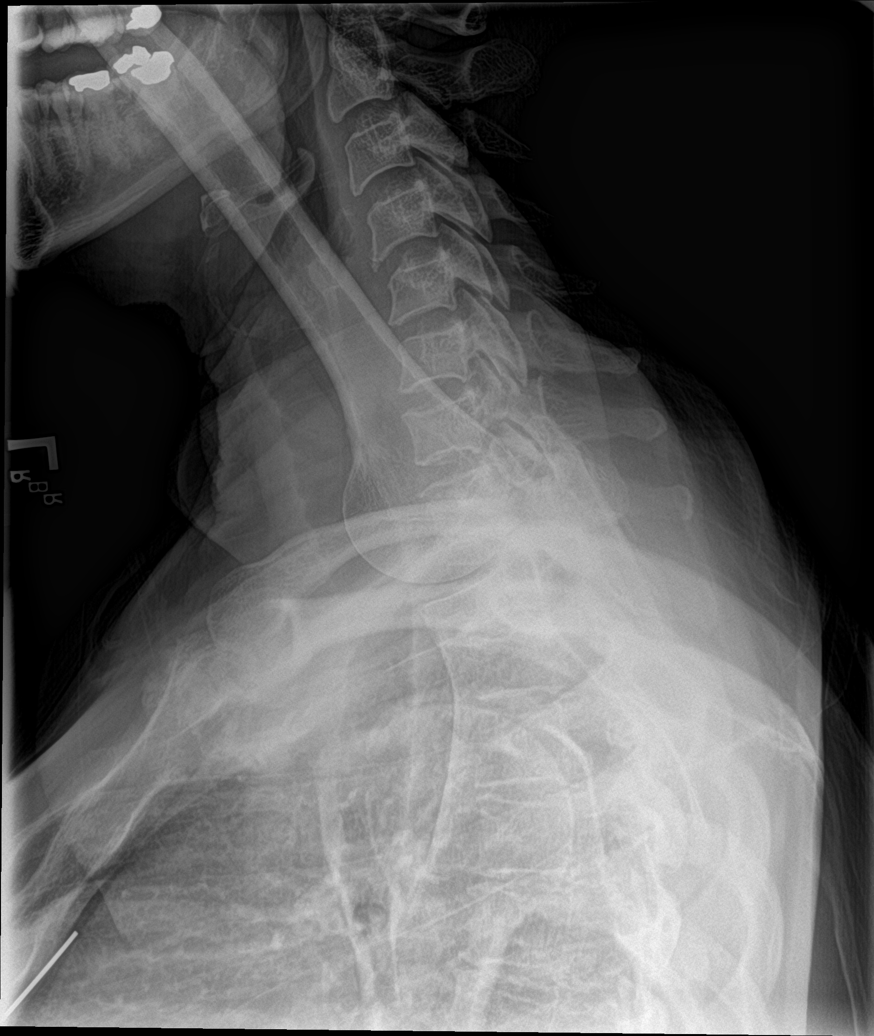

[6 of 6 positions shown; findings below may reference images not displayed]

FINDINGS: There is no evidence of cervical spine fracture or prevertebral soft
tissue swelling. Alignment is normal. No other significant bone
abnormalities are identified.
IMPRESSION: Negative cervical spine radiographs.  No change from priors.

## 2020-02-23 NOTE — Telephone Encounter (Signed)
[facility] pharmacy is requesting med refill for meloxicam. Rx not listed in active med list.

## 2020-03-09 ENCOUNTER — Other Ambulatory Visit: Payer: Self-pay | Admitting: Medical-Surgical

## 2020-03-09 NOTE — Telephone Encounter (Signed)
Routing to provider who last saw her for this.

## 2020-03-09 NOTE — Telephone Encounter (Signed)
Routing to covering provider.  °

## 2020-03-13 NOTE — Telephone Encounter (Signed)
Cannot give refills on a narcotic for an acute condition such as this. If she needs intervention for this, she will need to be seen.

## 2020-04-09 DIAGNOSIS — X32XXXS Exposure to sunlight, sequela: Secondary | ICD-10-CM | POA: Diagnosis not present

## 2020-04-09 DIAGNOSIS — L309 Dermatitis, unspecified: Secondary | ICD-10-CM | POA: Diagnosis not present

## 2020-04-09 DIAGNOSIS — L821 Other seborrheic keratosis: Secondary | ICD-10-CM | POA: Diagnosis not present

## 2020-04-09 DIAGNOSIS — L814 Other melanin hyperpigmentation: Secondary | ICD-10-CM | POA: Diagnosis not present

## 2020-04-09 DIAGNOSIS — L820 Inflamed seborrheic keratosis: Secondary | ICD-10-CM | POA: Diagnosis not present

## 2020-04-09 DIAGNOSIS — L237 Allergic contact dermatitis due to plants, except food: Secondary | ICD-10-CM | POA: Diagnosis not present

## 2020-05-10 DIAGNOSIS — I671 Cerebral aneurysm, nonruptured: Secondary | ICD-10-CM | POA: Diagnosis not present

## 2020-05-10 DIAGNOSIS — I729 Aneurysm of unspecified site: Secondary | ICD-10-CM | POA: Diagnosis not present

## 2020-05-19 ENCOUNTER — Other Ambulatory Visit: Payer: Self-pay | Admitting: Medical-Surgical

## 2020-05-19 DIAGNOSIS — F909 Attention-deficit hyperactivity disorder, unspecified type: Secondary | ICD-10-CM

## 2020-05-21 MED ORDER — AMPHETAMINE-DEXTROAMPHETAMINE 10 MG PO TABS: 10.0000 mg | ORAL_TABLET | Freq: Two times a day (BID) | ORAL | 0 refills | Status: DC

## 2020-05-23 DIAGNOSIS — Z1231 Encounter for screening mammogram for malignant neoplasm of breast: Secondary | ICD-10-CM | POA: Diagnosis not present

## 2020-06-27 ENCOUNTER — Other Ambulatory Visit: Payer: Self-pay

## 2020-06-27 DIAGNOSIS — I10 Essential (primary) hypertension: Secondary | ICD-10-CM

## 2020-06-27 MED ORDER — HYDROCHLOROTHIAZIDE 25 MG PO TABS: 25.0000 mg | ORAL_TABLET | Freq: Every day | ORAL | 0 refills | Status: DC

## 2020-06-27 MED ORDER — ATENOLOL 50 MG PO TABS: 50.0000 mg | ORAL_TABLET | Freq: Every day | ORAL | 0 refills | Status: DC

## 2020-08-15 ENCOUNTER — Other Ambulatory Visit: Payer: Self-pay | Admitting: Osteopathic Medicine

## 2020-08-15 DIAGNOSIS — F909 Attention-deficit hyperactivity disorder, unspecified type: Secondary | ICD-10-CM

## 2020-08-16 NOTE — Telephone Encounter (Signed)
Last written 05/21/2020 #60 with no refills  Last appt 02/17/2020 with Caryl Asp

## 2020-09-22 ENCOUNTER — Other Ambulatory Visit: Payer: Self-pay | Admitting: Osteopathic Medicine

## 2020-09-22 DIAGNOSIS — E782 Mixed hyperlipidemia: Secondary | ICD-10-CM

## 2020-10-19 ENCOUNTER — Emergency Department
Admission: EM | Admit: 2020-10-19 | Discharge: 2020-10-19 | Disposition: A | Discharge status: Home / Self Care | Payer: BC Managed Care – PPO | Source: Home / Self Care | Attending: Emergency Medicine | Admitting: Emergency Medicine

## 2020-10-19 ENCOUNTER — Emergency Department (INDEPENDENT_AMBULATORY_CARE_PROVIDER_SITE_OTHER): Payer: BC Managed Care – PPO

## 2020-10-19 ENCOUNTER — Other Ambulatory Visit: Payer: Self-pay

## 2020-10-19 ENCOUNTER — Encounter: Payer: Self-pay | Admitting: Emergency Medicine

## 2020-10-19 DIAGNOSIS — S93402A Sprain of unspecified ligament of left ankle, initial encounter: Secondary | ICD-10-CM | POA: Diagnosis not present

## 2020-10-19 DIAGNOSIS — M25572 Pain in left ankle and joints of left foot: Secondary | ICD-10-CM

## 2020-10-19 DIAGNOSIS — M7989 Other specified soft tissue disorders: Secondary | ICD-10-CM

## 2020-10-19 DIAGNOSIS — S93602A Unspecified sprain of left foot, initial encounter: Secondary | ICD-10-CM

## 2020-10-19 DIAGNOSIS — M79672 Pain in left foot: Secondary | ICD-10-CM | POA: Diagnosis not present

## 2020-10-19 MED ORDER — HYDROCODONE-ACETAMINOPHEN 5-325 MG PO TABS: 1.0000 | ORAL_TABLET | Freq: Four times a day (QID) | ORAL | 0 refills | Status: DC | PRN

## 2020-10-19 MED ORDER — IBUPROFEN 600 MG PO TABS: 600.0000 mg | ORAL_TABLET | Freq: Four times a day (QID) | ORAL | 0 refills | Status: DC | PRN

## 2020-10-19 MED ORDER — ACETAMINOPHEN 325 MG PO TABS
650.0000 mg | ORAL_TABLET | Freq: Once | ORAL | Status: AC
  Administered 2020-10-19: 650 mg via ORAL

## 2020-10-19 NOTE — ED Triage Notes (Signed)
Patient states that she stepped in a hole in her yard on Wednesday night, having left ankle and foot swelling.  Patient has been icing and elevating foot/ankle, swelling and pain has worsen.

## 2020-10-19 NOTE — Discharge Instructions (Addendum)
Take the ibuprofen 600 mg with a Tylenol-containing product 3-4 times a day.  Either ibuprofen/1000 mg of Tylenol for mild to moderate pain, or ibuprofen with 1-2 Norco for severe pain.  Continue ice, elevation.  Wear the ASO and postop shoe.

## 2020-10-19 NOTE — ED Provider Notes (Signed)
HPI  SUBJECTIVE:  Emily Ponce is a 55 y.o. female who presents with pain, swelling in her left foot and left lateral ankle after stepping in a hole 2 days ago.  Patient states that she rolled her ankle outward.  She describes the pain as burning, constant, throbbing.  States that the pain is worse today, shooting up her leg.  She states that she cannot bend her toes secondary to the pain and has limited ankle range of motion secondary to the pain and swelling.  No distal numbness or tingling, injury to the knee.  She has tried ice, elevation, ibuprofen 400 mg every 4-6 hours.  Symptoms are better with elevation, worse with movement, palpation.  She has a past medical history of gout, hypertension.  No history of diabetes, left ankle foot injury.  XLK:[id number], Lanelle Bal, DO Orthopedics: Dr. Dianah Field    Past Medical History:  Diagnosis Date  . Anxiety   . Broken neck ([place])    c-7  . GERD (gastroesophageal reflux disease)   . Hypertension   . Ovarian cyst     Past Surgical History:  Procedure Laterality Date  . CARPAL TUNNEL RELEASE Left   . COLONOSCOPY     about 5 years ago around 2015. Possibly [place]  . ESOPHAGOGASTRODUODENOSCOPY     about 7 years ago per patient. Around 2013  . esure     . TUBAL LIGATION      Family History  Problem Relation Age of Onset  . Heart attack Mother   . Hypertension Mother   . Alzheimer's disease Father   . Hypertension Father   . Cancer Sister        cervical  . Colon cancer Neg Hx   . Esophageal cancer Neg Hx     Social History   Tobacco Use  . Smoking status: Former Smoker    Quit date: 04/24/2010    Years since quitting: 10.4  . Smokeless tobacco: Never Used  Vaping Use  . Vaping Use: Never used  Substance Use Topics  . Alcohol use: Yes    Comment: occasional  . Drug use: No    No current facility-administered medications for this encounter.  Current Outpatient Medications:  .  allopurinol  (ZYLOPRIM) 300 MG tablet, Take 1 tablet (300 mg total) by mouth daily. (Patient taking differently: Take 300 mg by mouth daily as needed.), Disp: 90 tablet, Rfl: 3 .  Ascorbic Acid (VITAMIN C PO), Take 500 Units by mouth daily., Disp: , Rfl:  .  atenolol (TENORMIN) 50 MG tablet, Take 1 tablet (50 mg total) by mouth daily., Disp: 90 tablet, Rfl: 0 .  BIOTIN 5000 PO, Take 1 tablet by mouth daily., Disp: , Rfl:  .  Flaxseed, Linseed, (FLAX SEEDS) POWD, Take 1,000 mg by mouth daily. , Disp: , Rfl:  .  Glucosamine HCl (GLUCOSAMINE PO), Take 1 tablet by mouth daily., Disp: , Rfl:  .  hydrochlorothiazide (HYDRODIURIL) 25 MG tablet, Take 1 tablet (25 mg total) by mouth daily., Disp: 90 tablet, Rfl: 0 .  HYDROcodone-acetaminophen (NORCO/VICODIN) 5-325 MG tablet, Take 1-2 tablets by mouth every 6 (six) hours as needed for moderate pain or severe pain., Disp: 12 tablet, Rfl: 0 .  ibuprofen (ADVIL) 600 MG tablet, Take 1 tablet (600 mg total) by mouth every 6 (six) hours as needed., Disp: 30 tablet, Rfl: 0 .  milk thistle 175 MG tablet, Take 175 mg by mouth daily., Disp: , Rfl:  .  Omega-3 Fatty Acids (FISH  OIL PO), Take 1 capsule by mouth daily., Disp: , Rfl:  .  omeprazole (PRILOSEC) 40 MG capsule, TAKE 1 CAPSULE(40 MG) BY MOUTH DAILY, Disp: 90 capsule, Rfl: 0 .  amphetamine-dextroamphetamine (ADDERALL) 10 MG tablet, Take 1 tablet (10 mg total) by mouth 2 (two) times daily with breakfast and lunch. Take in addition to 5 mg tab for TDD of 15 mg bid, Disp: 60 tablet, Rfl: 0 .  amphetamine-dextroamphetamine (ADDERALL) 5 MG tablet, Take 1 tablet (5 mg total) by mouth 2 (two) times daily with breakfast and lunch. Take in addition to 10 mg tab for TDD of 15 mg bid, Disp: 60 tablet, Rfl: 0  Allergies  Allergen Reactions  . Chantix [Varenicline]     Dreams     ROS  As noted in HPI.   Physical Exam  BP 126/83 (BP Location: Right Arm)   Pulse 70   LMP  (LMP Unknown) Comment: LMP 2017  SpO2 100%    Constitutional: Well developed, well nourished, no acute distress Eyes:  EOMI, conjunctiva normal bilaterally HENT: Normocephalic, atraumatic,mucus membranes moist Respiratory: Normal inspiratory effort Cardiovascular: Normal rate GI: nondistended skin: No rash, skin intact Musculoskeletal: L Ankle: Diffuse tenderness over the entire ankle.  Lateral soft tissue swelling.  Proximal fibula NT  Distal fibula NT , Medial malleolus  tender,  Deltoid ligaments medially  tender,  ATFL  tender, calcaneofibular ligament  tender, posterior tablofibular ligament  tender  ,  Achilles NT, calcaneus NT,  Proximal 5th metatarsal NT, Midfoot tender, medial foot, MCP tender, diffuse soft tissue swelling of the foot, distal NVI with baseline sensation to foot intact with DP 2+.  Limited range of motion of ankle and toes due to pain and swelling.   Pain with dorsiflexion/plantar flexion. Pain with inversion/eversion. - bruising. - squeeze test.  Ant drawer test stable. Pt not able to bear weight in dept.  Neurologic: Alert & oriented x 3, no focal neuro deficits Psychiatric: Speech and behavior appropriate   ED Course   Medications  acetaminophen (TYLENOL) tablet 650 mg (650 mg Oral Given 10/19/20 0859)    Orders Placed This Encounter  Procedures  . DG Ankle Complete Left    Standing Status:   Standing    Number of Occurrences:   1    Order Specific Question:   Reason for Exam (SYMPTOM  OR DIAGNOSIS REQUIRED)    Answer:   left ankle pain    Order Specific Question:   Is patient pregnant?    Answer:   Yes  . DG Foot Complete Left    Standing Status:   Standing    Number of Occurrences:   1    Order Specific Question:   Reason for Exam (SYMPTOM  OR DIAGNOSIS REQUIRED)    Answer:   left foot pain    Order Specific Question:   Is patient pregnant?    Answer:   No  . Apply ASO ankle    Standing Status:   Standing    Number of Occurrences:   1    Order Specific Question:   Laterality    Answer:    Left    No results found for this or any previous visit (from the past 24 hour(s)). DG Ankle Complete Left  Result Date: 10/19/2020 CLINICAL DATA:  55 year old female stepped in a hole two nights ago with continued pain and swelling. EXAM: LEFT ANKLE COMPLETE - 3+ VIEW COMPARISON:  Left foot series 08/31/2018. FINDINGS: Preserved mortise joint alignment. Bone mineralization  is within normal limits. Talar dome intact. No evidence of joint effusion. Distal tibia, fibula, calcaneus and visible bones of the left foot appear intact. Mild soft tissue swelling. IMPRESSION: Soft tissue swelling with no fracture or dislocation identified about the left ankle. Electronically Signed   By: Genevie Ann M.D.   On: 10/19/2020 09:21   DG Foot Complete Left  Result Date: 10/19/2020 CLINICAL DATA:  55 year old female stepped in a hole two nights ago with continued pain and swelling. EXAM: LEFT FOOT - COMPLETE 3+ VIEW COMPARISON:  Left ankle series today.  Left foot series 08/31/2018. FINDINGS: Bone mineralization is within normal limits. No acute fracture or dislocation identified. Joint spaces and alignment are maintained. There is a chronic ossific fragment at the dorsal TMT joint level visible on the lateral view, unchanged. Mild dorsal tarsal bone degenerative spurring. No discrete soft tissue injury. IMPRESSION: No acute fracture or dislocation identified about the left foot. Electronically Signed   By: Genevie Ann M.D.   On: 10/19/2020 09:22    ED Clinical Impression  1. Sprain of left ankle, unspecified ligament, initial encounter   2. Acute left ankle pain   3. Swelling of left foot   4. Sprain of left foot, initial encounter      ED Assessment/Plan  [place] Narcotic database reviewed for this patient, and feel that the risk/benefit ratio today is favorable for proceeding with a prescription for controlled substance.  Last opiate prescription 8/21.  Reviewed imaging independently.  Soft tissue swelling with no  fracture or dislocation in the ankle.  No acute fracture or dislocation in the foot.  See radiology report for full details.  Patient with a left ankle and foot sprain.  X-rays negative for fracture or dislocation.  Will send home with ibuprofen/Tylenol-containing product, short course of Norco because pain is poorly controlled with ibuprofen 400 mg, continue ice, elevation, placing in ASO.  Patient has a postop shoe that she can wear.  She is to follow-up with Dr. Dianah Field within the week.  Discussed imaging, MDM, treatment plan, and plan for follow-up with patient.. patient agrees with plan.   Meds ordered this encounter  Medications  . acetaminophen (TYLENOL) tablet 650 mg  . HYDROcodone-acetaminophen (NORCO/VICODIN) 5-325 MG tablet    Sig: Take 1-2 tablets by mouth every 6 (six) hours as needed for moderate pain or severe pain.    Dispense:  12 tablet    Refill:  0  . ibuprofen (ADVIL) 600 MG tablet    Sig: Take 1 tablet (600 mg total) by mouth every 6 (six) hours as needed.    Dispense:  30 tablet    Refill:  0      *This clinic note was created using [employer]. Therefore, there may be occasional mistakes despite careful proofreading.  ?    Melynda Ripple, MD 10/20/20 [phone]

## 2020-10-22 ENCOUNTER — Ambulatory Visit: Payer: BC Managed Care – PPO | Admitting: Sports Medicine

## 2020-10-23 ENCOUNTER — Ambulatory Visit: Payer: BC Managed Care – PPO | Admitting: Sports Medicine

## 2020-10-23 ENCOUNTER — Other Ambulatory Visit: Payer: Self-pay

## 2020-10-23 DIAGNOSIS — S92252A Displaced fracture of navicular [scaphoid] of left foot, initial encounter for closed fracture: Secondary | ICD-10-CM | POA: Insufficient documentation

## 2020-10-23 DIAGNOSIS — S92255A Nondisplaced fracture of navicular [scaphoid] of left foot, initial encounter for closed fracture: Secondary | ICD-10-CM | POA: Diagnosis not present

## 2020-10-23 MED ORDER — HYDROCODONE-ACETAMINOPHEN 10-325 MG PO TABS: 1.0000 | ORAL_TABLET | Freq: Three times a day (TID) | ORAL | 0 refills | Status: DC | PRN

## 2020-10-23 NOTE — Progress Notes (Signed)
    Procedures performed today:    None.  Independent interpretation of notes and tests performed by another provider:   X-rays personally reviewed, there does appear to be a dorsal avulsion from the navicular, discussed with radiology, radiology agrees and will addend the report.  Brief History, Exam, Impression, and Recommendations:    Fracture of navicular bone of left foot This is a pleasant 55 year old female, unfortunately 5 days ago she stepped in a hole, rolled her ankle, and had immediate pain, swelling. She was seen in urgent care. Referred to me for further evaluation. On my personal review of the imaging she does have a small fleck of an avulsion fracture from the dorsal navicular, continue boot,    ___________________________________________ Gwen Her. Dianah Field, M.D., ABFM., CAQSM. Primary Care and [place] Instructor of [place] of [facility] of Medicine

## 2020-10-23 NOTE — Assessment & Plan Note (Signed)
This is a pleasant 55 year old female, unfortunately 5 days ago she stepped in a hole, rolled her ankle, and had immediate pain, swelling. She was seen in urgent care. Referred to me for further evaluation. On my personal review of the imaging she does have a small fleck of an avulsion fracture from the dorsal navicular, continue boot,

## 2020-10-30 ENCOUNTER — Telehealth: Payer: Self-pay

## 2020-10-30 NOTE — Telephone Encounter (Signed)
Patient called stating a PA was needed for Hydrocodone. PA completed and submitted. Following message received:   Drug is covered by current benefit plan. No further PA activity needed.

## 2020-10-31 ENCOUNTER — Other Ambulatory Visit: Payer: Self-pay

## 2020-10-31 DIAGNOSIS — S92255A Nondisplaced fracture of navicular [scaphoid] of left foot, initial encounter for closed fracture: Secondary | ICD-10-CM

## 2020-10-31 MED ORDER — HYDROCODONE-ACETAMINOPHEN 10-325 MG PO TABS: 1.0000 | ORAL_TABLET | Freq: Three times a day (TID) | ORAL | 0 refills | Status: DC | PRN

## 2020-10-31 NOTE — Telephone Encounter (Signed)
Patient's insurance will only allow a 7 day course of pain medications at a time. She has gone through the #21 tabs that was filled on the 19th. She is wanting more to get her through the weekend.

## 2020-10-31 NOTE — Telephone Encounter (Signed)
Patient aware prescription was sent to pharmacy.  

## 2020-11-12 ENCOUNTER — Ambulatory Visit: Payer: BC Managed Care – PPO | Admitting: Sports Medicine

## 2020-11-12 ENCOUNTER — Other Ambulatory Visit: Payer: Self-pay

## 2020-11-12 DIAGNOSIS — S92255D Nondisplaced fracture of navicular [scaphoid] of left foot, subsequent encounter for fracture with routine healing: Secondary | ICD-10-CM | POA: Diagnosis not present

## 2020-11-12 DIAGNOSIS — S92255A Nondisplaced fracture of navicular [scaphoid] of left foot, initial encounter for closed fracture: Secondary | ICD-10-CM | POA: Diagnosis not present

## 2020-11-12 DIAGNOSIS — M10071 Idiopathic gout, right ankle and foot: Secondary | ICD-10-CM | POA: Diagnosis not present

## 2020-11-12 MED ORDER — HYDROCODONE-ACETAMINOPHEN 10-325 MG PO TABS: 1.0000 | ORAL_TABLET | Freq: Three times a day (TID) | ORAL | 0 refills | Status: DC | PRN

## 2020-11-12 MED ORDER — INDOMETHACIN 50 MG PO CAPS: 50.0000 mg | ORAL_CAPSULE | Freq: Two times a day (BID) | ORAL | 0 refills | Status: DC

## 2020-11-12 MED ORDER — COLCHICINE 0.6 MG PO TABS: 0.6000 mg | ORAL_TABLET | Freq: Two times a day (BID) | ORAL | 2 refills | Status: DC

## 2020-11-12 NOTE — Assessment & Plan Note (Signed)
Emily Ponce returns, she is a 55 year old female, she is now approximately 3 weeks to 4 weeks post stepping in a hole, inversion injury with x-rays that showed an avulsion fracture from the dorsal navicular. She continues in the boot, she does have an appointment with me in a week, I like her to cancel this and see me in 2 weeks and we can follow-up her gout flare and her navicular fracture at the same time.

## 2020-11-12 NOTE — Progress Notes (Signed)
    Procedures performed today:    None.  Independent interpretation of notes and tests performed by another provider:   None.  Brief History, Exam, Impression, and Recommendations:    Gout flare This is a pleasant 55 year old female, she has a history of gout, she had rapid onset pain in Ponce right second and third MTPs. This is an exacerbation of Ponce chronic disease. On exam she has swelling, redness, warmth. I think this is a gout flare, she has restarted a postop shoe, we will add colchicine, indomethacin, refill Ponce hydrocodone, return to see me in 2 weeks. She does admit to not taking allopurinol as well if she should, I will probably recheck Ponce uric acid levels at the 2-week follow-up.  Fracture of navicular bone of left foot Emily Ponce returns, she is a 55 year old female, she is now approximately 3 weeks to 4 weeks post stepping in a hole, inversion injury with x-rays that showed an avulsion fracture from the dorsal navicular. She continues in the boot, she does have an appointment with me in a week, I like Ponce to cancel this and see me in 2 weeks and we can follow-up Ponce gout flare and Ponce navicular fracture at the same time.    ___________________________________________ Emily Ponce. Dianah Field, M.D., ABFM., CAQSM. Primary Care and [place] Instructor of [place] of [facility] of Medicine

## 2020-11-12 NOTE — Assessment & Plan Note (Addendum)
This is a pleasant 55 year old female, she has a history of gout, she had rapid onset pain in her right second and third MTPs. This is an exacerbation of her chronic disease. On exam she has swelling, redness, warmth. I think this is a gout flare, she has restarted a postop shoe, we will add colchicine, indomethacin, refill her hydrocodone, return to see me in 2 weeks. She does admit to not taking allopurinol as well if she should, I will probably recheck her uric acid levels at the 2-week follow-up.

## 2020-11-20 ENCOUNTER — Encounter: Payer: Self-pay | Admitting: Medical-Surgical

## 2020-11-20 ENCOUNTER — Ambulatory Visit: Payer: BC Managed Care – PPO | Admitting: Sports Medicine

## 2020-11-20 ENCOUNTER — Telehealth (INDEPENDENT_AMBULATORY_CARE_PROVIDER_SITE_OTHER): Payer: BC Managed Care – PPO | Admitting: Medical-Surgical

## 2020-11-20 VITALS — Temp 101.0°F

## 2020-11-20 DIAGNOSIS — Z20822 Contact with and (suspected) exposure to covid-19: Secondary | ICD-10-CM

## 2020-11-20 MED ORDER — ONDANSETRON 8 MG PO TBDP: 8.0000 mg | ORAL_TABLET | Freq: Three times a day (TID) | ORAL | 0 refills | Status: DC | PRN

## 2020-11-20 NOTE — Progress Notes (Signed)
Virtual Visit via Video Note  I connected with Emily Ponce on 11/20/20 at  1:20 PM EDT by a video enabled telemedicine application and verified that I am speaking with the correct person using two identifiers.   I discussed the limitations of evaluation and management by telemedicine and the availability of in person appointments. The patient expressed understanding and agreed to proceed.  Patient location: home Provider locations: office  Subjective:    CC: COVID exposure  HPI: Pleasant 55 year old female presenting via MyChart video with complaints of sudden onset of fever, vomiting, diarrhea, chills, decreased appetite, lethargy, body aches, headache and cough that started yesterday evening.  T-max 101.  Cough productive of clear to white chunky mucus.  Diarrhea 2-3 episodes today.  No blood in emesis or stool.  She has not been eating regularly but has been nibbling on crackers.  Notes her husband tested positive for COVID 5 days ago and has had similar symptoms.  She has tried taking Tylenol PM as well as ibuprofen but these were only minimal help.  They do not have a COVID test in the home to use and she reports she has not tested because they did not feel like leaving the premises to go get 1.  Denies shortness of breath, chest pain, and loss of taste/smell.  Past medical history, Surgical history, Family history not pertinant except as noted below, Social history, Allergies, and medications have been entered into the medical record, reviewed, and corrections made.   Review of Systems: See HPI for pertinent positives and negatives.   Objective:    General: Speaking clearly in complete sentences without any shortness of breath.  Alert and oriented x3.  Normal judgment. No apparent acute distress.  Impression and Recommendations:    1. Suspected COVID-19 virus infection Reviewed outpatient options for treating COVID-19.  She will need to have a positive test before I can  prescribe any oral antivirals.  Reinforced the need for a positive test and advised patient to let us know when she has been able to do that.  Symptomatic treatment discussed with over-the-counter flu and cold preparations.  Advised that these have a risk of increasing blood pressure and hypertensive patients so she should monitor her blood pressure closely and if it begins to rise, switch to Coricidin.  Okay to use Tylenol or ibuprofen for body aches/headache and fever.  Drink small amounts of fluid frequently and slowly increase diet until back to normal.  Sending Zofran 8 mg ODT every 8 hours as needed for nausea.  I discussed the assessment and treatment plan with the patient. The patient was provided an opportunity to ask questions and all were answered. The patient agreed with the plan and demonstrated an understanding of the instructions.   The patient was advised to call back or seek an in-person evaluation if the symptoms worsen or if the condition fails to improve as anticipated.  20 minutes of non-face-to-face time was provided during this encounter.  Return if symptoms worsen or fail to improve.  Clearnce Sorrel, DNP, APRN, FNP-BC [place] [facility]

## 2020-11-22 ENCOUNTER — Telehealth: Payer: Self-pay

## 2020-11-22 MED ORDER — MOLNUPIRAVIR EUA 200MG CAPSULE: 4.0000 | ORAL_CAPSULE | Freq: Two times a day (BID) | ORAL | 0 refills | Status: AC

## 2020-11-22 NOTE — Telephone Encounter (Signed)
Pt aware Rx has been sent to the pharmacy. No further questions or concerns at this time.   

## 2020-11-22 NOTE — Telephone Encounter (Signed)
Pt was instructed to call back with the results of her COVID test, which is positive. Pt would like a Rx for the antiviral med sent to her pharmacy. Instructed pt to call her pharmacy to find out which antiviral med they have in stock and if they do not have anything, to call other pharmacies and then let us know.

## 2020-11-22 NOTE — Telephone Encounter (Signed)
Since she has had a COVID test that resulted positive, sending in [place] 800mg  twice daily x5 days.  This has been sent to [facility] [address] as requested.  Clearnce Sorrel, DNP, APRN, FNP-BC [facility]

## 2020-11-22 NOTE — Telephone Encounter (Signed)
Please send Rx to [facility] on [address]

## 2020-12-07 ENCOUNTER — Other Ambulatory Visit: Payer: Self-pay

## 2020-12-07 ENCOUNTER — Ambulatory Visit: Payer: BC Managed Care – PPO | Admitting: Sports Medicine

## 2020-12-07 DIAGNOSIS — M7741 Metatarsalgia, right foot: Secondary | ICD-10-CM | POA: Diagnosis not present

## 2020-12-07 DIAGNOSIS — M10071 Idiopathic gout, right ankle and foot: Secondary | ICD-10-CM

## 2020-12-07 DIAGNOSIS — S92255A Nondisplaced fracture of navicular [scaphoid] of left foot, initial encounter for closed fracture: Secondary | ICD-10-CM | POA: Diagnosis not present

## 2020-12-07 DIAGNOSIS — S92255D Nondisplaced fracture of navicular [scaphoid] of left foot, subsequent encounter for fracture with routine healing: Secondary | ICD-10-CM

## 2020-12-07 MED ORDER — HYDROCODONE-ACETAMINOPHEN 10-325 MG PO TABS: 1.0000 | ORAL_TABLET | Freq: Three times a day (TID) | ORAL | 0 refills | Status: DC | PRN

## 2020-12-07 MED ORDER — ALLOPURINOL 300 MG PO TABS: 300.0000 mg | ORAL_TABLET | Freq: Every day | ORAL | 3 refills | Status: AC

## 2020-12-07 NOTE — Assessment & Plan Note (Addendum)
Emily Ponce is also having some right third metatarsalgia, she does tend to walk barefoot most of the time. She has breakdown of the transverse arch and abnormal callus. She does have 2 sets of orthotics that I made her several years ago, these are in good shape so at her follow-up visit she will bring them and we will place a metatarsal pad on the right. She can discontinue her postop shoe for now.

## 2020-12-07 NOTE — Progress Notes (Signed)
    Procedures performed today:    None.  Independent interpretation of notes and tests performed by another provider:   None.  Brief History, Exam, Impression, and Recommendations:    Gout flare Emily Ponce returns, she is a pleasant 55 year old female, she had a gout flare in Ponce right second and third MTPs, we treated Ponce aggressively with colchicine, indomethacin, refill hydrocodone, she returns today with Ponce gout flare resolved. We will go ahead and check Ponce uric acid levels today, historically she was not entirely consistent with Ponce allopurinol usage.  Metatarsalgia, right foot Emily Ponce is also having some right third metatarsalgia, she does tend to walk barefoot most of the time. She has breakdown of the transverse arch and abnormal callus. She does have 2 sets of orthotics that I made Ponce several years ago, these are in good shape so at Ponce follow-up visit she will bring them and we will place a metatarsal pad on the right. She can discontinue Ponce postop shoe for now.  Fracture of navicular bone of left foot It is now been just over 4 weeks, navicular avulsion fracture, she should continue the boot for at least another 2 to 3 weeks, return to see me in 3 weeks.    ___________________________________________ Emily Ponce. Emily Ponce, M.D., ABFM., CAQSM. Primary Care and [place] Instructor of [place] of [facility] of Medicine

## 2020-12-07 NOTE — Assessment & Plan Note (Signed)
Emily Ponce returns, she is a pleasant 55 year old female, she had a gout flare in her right second and third MTPs, we treated her aggressively with colchicine, indomethacin, refill hydrocodone, she returns today with her gout flare resolved. We will go ahead and check her uric acid levels today, historically she was not entirely consistent with her allopurinol usage.

## 2020-12-07 NOTE — Assessment & Plan Note (Signed)
It is now been just over 4 weeks, navicular avulsion fracture, she should continue the boot for at least another 2 to 3 weeks, return to see me in 3 weeks.

## 2020-12-08 LAB — COMPREHENSIVE METABOLIC PANEL
AG Ratio: 1.9 (calc) (ref 1.0–2.5)
ALT: 103 U/L — ABNORMAL HIGH (ref 6–29)
AST: 51 U/L — ABNORMAL HIGH (ref 10–35)
Albumin: 4.4 g/dL (ref 3.6–5.1)
Alkaline phosphatase (APISO): 53 U/L (ref 37–153)
BUN: 22 mg/dL (ref 7–25)
CO2: 29 mmol/L (ref 20–32)
Calcium: 9.8 mg/dL (ref 8.6–10.4)
Chloride: 105 mmol/L (ref 98–110)
Creat: 0.82 mg/dL (ref 0.50–1.05)
Globulin: 2.3 g/dL (calc) (ref 1.9–3.7)
Glucose, Bld: 92 mg/dL (ref 65–99)
Potassium: 3.9 mmol/L (ref 3.5–5.3)
Sodium: 143 mmol/L (ref 135–146)
Total Bilirubin: 0.4 mg/dL (ref 0.2–1.2)
Total Protein: 6.7 g/dL (ref 6.1–8.1)

## 2020-12-08 LAB — URIC ACID: Uric Acid, Serum: 4.8 mg/dL (ref 2.5–7.0)

## 2020-12-10 ENCOUNTER — Other Ambulatory Visit: Payer: Self-pay | Admitting: Sports Medicine

## 2020-12-10 DIAGNOSIS — L821 Other seborrheic keratosis: Secondary | ICD-10-CM | POA: Diagnosis not present

## 2020-12-10 DIAGNOSIS — D235 Other benign neoplasm of skin of trunk: Secondary | ICD-10-CM | POA: Diagnosis not present

## 2020-12-10 DIAGNOSIS — M10071 Idiopathic gout, right ankle and foot: Secondary | ICD-10-CM

## 2020-12-10 MED ORDER — INDOMETHACIN 50 MG PO CAPS: 50.0000 mg | ORAL_CAPSULE | Freq: Two times a day (BID) | ORAL | 11 refills | Status: DC

## 2020-12-21 ENCOUNTER — Other Ambulatory Visit: Payer: Self-pay

## 2020-12-21 DIAGNOSIS — I10 Essential (primary) hypertension: Secondary | ICD-10-CM

## 2020-12-21 DIAGNOSIS — E782 Mixed hyperlipidemia: Secondary | ICD-10-CM

## 2020-12-21 MED ORDER — OMEPRAZOLE 40 MG PO CPDR: DELAYED_RELEASE_CAPSULE | ORAL | 0 refills | Status: DC

## 2020-12-21 MED ORDER — ATENOLOL 50 MG PO TABS: 50.0000 mg | ORAL_TABLET | Freq: Every day | ORAL | 0 refills | Status: DC

## 2020-12-21 MED ORDER — HYDROCHLOROTHIAZIDE 25 MG PO TABS: 25.0000 mg | ORAL_TABLET | Freq: Every day | ORAL | 0 refills | Status: DC

## 2020-12-28 ENCOUNTER — Ambulatory Visit: Payer: BC Managed Care – PPO | Admitting: Sports Medicine

## 2020-12-28 ENCOUNTER — Other Ambulatory Visit: Payer: Self-pay

## 2020-12-28 DIAGNOSIS — M7741 Metatarsalgia, right foot: Secondary | ICD-10-CM

## 2020-12-28 DIAGNOSIS — S92255A Nondisplaced fracture of navicular [scaphoid] of left foot, initial encounter for closed fracture: Secondary | ICD-10-CM | POA: Diagnosis not present

## 2020-12-28 MED ORDER — HYDROCODONE-ACETAMINOPHEN 10-325 MG PO TABS: 1.0000 | ORAL_TABLET | Freq: Three times a day (TID) | ORAL | 0 refills | Status: DC | PRN

## 2020-12-28 NOTE — Progress Notes (Signed)
    Procedures performed today:    None.  Independent interpretation of notes and tests performed by another provider:   None.  Brief History, Exam, Impression, and Recommendations:    Metatarsalgia, right foot Emily Ponce returns, she has right third metatarsalgia, she does a lot of barefoot walking. The initial plan was to put metatarsal pads in her orthotics, I had built her 2 sets years ago. Unfortunately she threw away the shoes with the orthotics in them. I placed a metatarsal pad in her sandals, I gave her several others and showed her how to place them in her shoes, I would like her to touch base with Dr. Raeford Razor for custom orthotics and metatarsal pads. I think getting her better will center on getting her to avoid barefoot walking. She admits this is going to be a difficult half century long habit to break. Happy to give her some hydrocodone to hold her over along the way.    ___________________________________________ Gwen Her. Dianah Field, M.D., ABFM., CAQSM. Primary Care and [place] Instructor of [place] of [facility] of Medicine

## 2020-12-28 NOTE — Assessment & Plan Note (Signed)
Emily Ponce returns, she has right third metatarsalgia, she does a lot of barefoot walking. The initial plan was to put metatarsal pads in her orthotics, I had built her 2 sets years ago. Unfortunately she threw away the shoes with the orthotics in them. I placed a metatarsal pad in her sandals, I gave her several others and showed her how to place them in her shoes, I would like her to touch base with Dr. Raeford Razor for custom orthotics and metatarsal pads. I think getting her better will center on getting her to avoid barefoot walking. She admits this is going to be a difficult half century long habit to break. Happy to give her some hydrocodone to hold her over along the way.

## 2021-01-16 ENCOUNTER — Encounter: Payer: BC Managed Care – PPO | Admitting: Osteopathic Medicine

## 2021-01-18 ENCOUNTER — Encounter: Payer: BC Managed Care – PPO | Admitting: Osteopathic Medicine

## 2021-01-20 ENCOUNTER — Other Ambulatory Visit: Payer: Self-pay | Admitting: Osteopathic Medicine

## 2021-01-20 DIAGNOSIS — I10 Essential (primary) hypertension: Secondary | ICD-10-CM

## 2021-01-21 ENCOUNTER — Other Ambulatory Visit: Payer: Self-pay | Admitting: *Deleted

## 2021-01-21 DIAGNOSIS — I10 Essential (primary) hypertension: Secondary | ICD-10-CM

## 2021-01-21 MED ORDER — HYDROCHLOROTHIAZIDE 25 MG PO TABS: 25.0000 mg | ORAL_TABLET | Freq: Every day | ORAL | 0 refills | Status: DC

## 2021-02-12 ENCOUNTER — Ambulatory Visit (INDEPENDENT_AMBULATORY_CARE_PROVIDER_SITE_OTHER): Payer: BC Managed Care – PPO | Admitting: Osteopathic Medicine

## 2021-02-12 ENCOUNTER — Other Ambulatory Visit: Payer: Self-pay

## 2021-02-12 ENCOUNTER — Other Ambulatory Visit (HOSPITAL_COMMUNITY)
Admission: RE | Admit: 2021-02-12 | Discharge: 2021-02-12 | Disposition: A | Discharge status: Home / Self Care | Payer: BC Managed Care – PPO | Source: Ambulatory Visit | Attending: Osteopathic Medicine | Admitting: Osteopathic Medicine

## 2021-02-12 VITALS — BP 121/56 | HR 63 | Temp 98.3°F | Wt 163.0 lb

## 2021-02-12 DIAGNOSIS — I10 Essential (primary) hypertension: Secondary | ICD-10-CM

## 2021-02-12 DIAGNOSIS — N951 Menopausal and female climacteric states: Secondary | ICD-10-CM | POA: Diagnosis not present

## 2021-02-12 DIAGNOSIS — Z0000 Encounter for general adult medical examination without abnormal findings: Secondary | ICD-10-CM | POA: Diagnosis not present

## 2021-02-12 DIAGNOSIS — Z1231 Encounter for screening mammogram for malignant neoplasm of breast: Secondary | ICD-10-CM | POA: Diagnosis not present

## 2021-02-12 DIAGNOSIS — T63461A Toxic effect of venom of wasps, accidental (unintentional), initial encounter: Secondary | ICD-10-CM | POA: Diagnosis not present

## 2021-02-12 DIAGNOSIS — Z124 Encounter for screening for malignant neoplasm of cervix: Secondary | ICD-10-CM

## 2021-02-12 DIAGNOSIS — E782 Mixed hyperlipidemia: Secondary | ICD-10-CM | POA: Diagnosis not present

## 2021-02-12 DIAGNOSIS — F17210 Nicotine dependence, cigarettes, uncomplicated: Secondary | ICD-10-CM

## 2021-02-12 MED ORDER — ESTRADIOL 1 MG PO TABS: 1.0000 mg | ORAL_TABLET | Freq: Every day | ORAL | 3 refills | Status: DC

## 2021-02-12 MED ORDER — LIDOCAINE-PRILOCAINE 2.5-2.5 % EX CREA: 1.0000 "application " | TOPICAL_CREAM | CUTANEOUS | 0 refills | Status: DC | PRN

## 2021-02-12 MED ORDER — ATENOLOL 50 MG PO TABS: 50.0000 mg | ORAL_TABLET | Freq: Every day | ORAL | 3 refills | Status: DC

## 2021-02-12 MED ORDER — OXYCODONE-ACETAMINOPHEN 5-325 MG PO TABS: 1.0000 | ORAL_TABLET | Freq: Four times a day (QID) | ORAL | 0 refills | Status: AC | PRN

## 2021-02-12 MED ORDER — PROGESTERONE MICRONIZED 100 MG PO CAPS: 100.0000 mg | ORAL_CAPSULE | Freq: Every day | ORAL | 3 refills | Status: DC

## 2021-02-12 MED ORDER — OXYCODONE-ACETAMINOPHEN 5-325 MG PO TABS: 1.0000 | ORAL_TABLET | Freq: Four times a day (QID) | ORAL | 0 refills | Status: DC | PRN

## 2021-02-12 MED ORDER — OMEPRAZOLE 40 MG PO CPDR: DELAYED_RELEASE_CAPSULE | ORAL | 3 refills | Status: DC

## 2021-02-12 MED ORDER — HYDROCHLOROTHIAZIDE 25 MG PO TABS
25.0000 mg | ORAL_TABLET | Freq: Every day | ORAL | 3 refills | Status: DC
Start: 2021-02-12 — End: 2021-04-19

## 2021-02-12 NOTE — Patient Instructions (Addendum)
Sting pain: Oxycodone-Acetaminophen may help, the Emla numbing cream may actually be more helpful, this was sent too Menopause: Rx for hormones sent. If moods still a problem after a cpuple months, let us know!   General Preventive Care Most recent routine screening labs: reviewed.  Blood pressure goal 130/80 or less.  Tobacco: don't!  Alcohol: responsible moderation is ok for most adults - if you have concerns about your alcohol intake, please talk to me!  Exercise: as tolerated to reduce risk of cardiovascular disease and diabetes. Strength training will also prevent osteoporosis.  Mental health: if need for mental health care (medicines, counseling, other), or concerns about moods, please let me know!  Sexual / Reproductive health: if need for STD testing, or if concerns with libido/pain problems, please let me know!  Advanced Directive: Living Will and/or Healthcare Power of Attorney recommended for all adults, regardless of age or health.  Vaccines Flu vaccine: for almost everyone, every fall.  Shingles vaccine: all done! Pneumonia vaccines: after age 76. Tetanus booster: every 10 years, due 2026 COVID vaccine: STRONGLY RECOMMENDED! Cancer screenings  Colon cancer screening: due 2025 Breast cancer screening: mammogram ordered Cervical cancer screening: Pap done today, repeat 5 yrs if normal Lung cancer screening: CT chest every year for those aged 59 to 74 years who have a 20 pack-year smoking history and currently smoke or have quit within the past 15 years  Infection screenings  HIV: recommended screening at least once age 85-65,. Gonorrhea/Chlamydia: screening as needed Hepatitis C: recommended once for everyone age 0000000 TB: certain at-risk populations Other Bone Density Test: recommended for women at age 51

## 2021-02-12 NOTE — Progress Notes (Signed)
Emily Ponce is a 55 y.o. female who presents to  [facility]  today, 02/12/21, seeking care for the following:  Annual physical  Menopausal concerns - hot flashes making sleep dfficult Wasp stings on ankles, very painful, causing difficulty w/ sleep   BP Readings from Last 3 Encounters:  02/12/21 (!) 121/56  10/19/20 126/83  02/17/20 125/77          ASSESSMENT & PLAN with other pertinent findings:  The primary encounter diagnosis was Annual physical exam. Diagnoses of Essential hypertension, Mixed hyperlipidemia, Breast cancer screening by mammogram, Cervical cancer screening, Smoking greater than 20 pack years, Wasp sting, accidental or unintentional, initial encounter, and Menopausal symptoms were also pertinent to this visit.    Patient Instructions  Sting pain: Oxycodone-Acetaminophen may help, the Emla numbing cream may actually be more helpful, this was sent too Menopause: Rx for hormones sent. If moods still a problem after a cpuple months, let us know!   General Preventive Care Most recent routine screening labs: reviewed.  Blood pressure goal 130/80 or less.  Tobacco: don't!  Alcohol: responsible moderation is ok for most adults - if you have concerns about your alcohol intake, please talk to me!  Exercise: as tolerated to reduce risk of cardiovascular disease and diabetes. Strength training will also prevent osteoporosis.  Mental health: if need for mental health care (medicines, counseling, other), or concerns about moods, please let me know!  Sexual / Reproductive health: if need for STD testing, or if concerns with libido/pain problems, please let me know!  Advanced Directive: Living Will and/or Healthcare Power of Attorney recommended for all adults, regardless of age or health.  Vaccines Flu vaccine: for almost everyone, every fall.  Shingles vaccine: all done! Pneumonia vaccines: after age  27. Tetanus booster: every 10 years, due 2026 COVID vaccine: STRONGLY RECOMMENDED! Cancer screenings  Colon cancer screening: due 2025 Breast cancer screening: mammogram ordered Cervical cancer screening: Pap done today, repeat 5 yrs if normal Lung cancer screening: CT chest every year for those aged 79 to 41 years who have a 20 pack-year smoking history and currently smoke or have quit within the past 15 years  Infection screenings  HIV: recommended screening at least once age 40-65,. Gonorrhea/Chlamydia: screening as needed Hepatitis C: recommended once for everyone age 0000000 TB: certain at-risk populations Other Bone Density Test: recommended for women at age 49  Orders Placed This Encounter  Procedures   MM 3D [place]   Ambulatory Referral Lung Cancer Screening [facility]    Meds ordered this encounter  Medications   atenolol (TENORMIN) 50 MG tablet    Sig: Take 1 tablet (50 mg total) by mouth daily.    Dispense:  90 tablet    Refill:  3   hydrochlorothiazide (HYDRODIURIL) 25 MG tablet    Sig: Take 1 tablet (25 mg total) by mouth daily.    Dispense:  90 tablet    Refill:  3   omeprazole (PRILOSEC) 40 MG capsule    Sig: TAKE 1 CAPSULE(40 MG) BY MOUTH DAILY    Dispense:  90 capsule    Refill:  3   estradiol (ESTRACE) 1 MG tablet    Sig: Take 1 tablet (1 mg total) by mouth daily.    Dispense:  90 tablet    Refill:  3   progesterone (PROMETRIUM) 100 MG capsule    Sig: Take 1 capsule (100 mg total) by mouth daily.  Dispense:  90 capsule    Refill:  3   DISCONTD: oxyCODONE-acetaminophen (PERCOCET/ROXICET) 5-325 MG tablet    Sig: Take 1-2 tablets by mouth every 6 (six) hours as needed for up to 5 days for severe pain.    Dispense:  10 tablet    Refill:  0   lidocaine-prilocaine (EMLA) cream    Sig: Apply 1 application topically as needed.    Dispense:  30 g    Refill:  0   oxyCODONE-acetaminophen (PERCOCET/ROXICET) 5-325 MG tablet    Sig:  Take 1-2 tablets by mouth every 6 (six) hours as needed for up to 5 days for severe pain.    Dispense:  10 tablet    Refill:  0     See below for relevant physical exam findings  See below for recent lab and imaging results reviewed  Medications, allergies, PMH, PSH, SocH, [place] reviewed below    Follow-up instructions: Return in about 1 year (around 02/12/2022) for [place] 01/2022 TO SET UP ANNUAL IN 02/2022.                                        Exam:  BP (!) 121/56 (BP Location: Left Arm, Patient Position: Sitting, Cuff Size: Normal)   Pulse 63   Temp 98.3 F (36.8 C) (Oral)   Wt 163 lb (73.9 kg)   LMP  (LMP Unknown) Comment: LMP 2017  BMI 27.12 kg/m  Constitutional: VS see above. General Appearance: alert, well-developed, well-nourished, NAD Neck: No masses, trachea midline.  Respiratory: Normal respiratory effort. no wheeze, no rhonchi, no rales Cardiovascular: S1/S2 normal, no murmur, no rub/gallop auscultated. RRR.  Musculoskeletal: Gait normal. Symmetric and independent movement of all extremities Abdominal: non-tender, non-distended, no appreciable organomegaly, neg Murphy's, BS [id number] Neurological: Normal balance/coordination. No tremor. Skin: warm, dry, intact.  Psychiatric: Normal judgment/insight. Normal mood and affect. Oriented x3.  GYN: No lesions/ulcers to external genitalia, normal urethra, normal vaginal mucosa, physiologic discharge, cervix normal without lesions, uterus not enlarged or tender, adnexa no masses and nontender  Current Meds  Medication Sig   allopurinol (ZYLOPRIM) 300 MG tablet Take 1 tablet (300 mg total) by mouth daily.   Ascorbic Acid (VITAMIN C PO) Take 500 Units by mouth daily.   BIOTIN 5000 PO Take 1 tablet by mouth daily.   calcium-vitamin D (OSCAL WITH D) 500-200 MG-UNIT tablet Take 1 tablet by mouth.   colchicine 0.6 MG tablet Take 1 tablet (0.6 mg total) by mouth 2  (two) times daily.   estradiol (ESTRACE) 1 MG tablet Take 1 tablet (1 mg total) by mouth daily.   Flaxseed, Linseed, (FLAX SEEDS) POWD Take 1,000 mg by mouth daily.    Glucosamine HCl (GLUCOSAMINE PO) Take 1 tablet by mouth daily.   indomethacin (INDOCIN) 50 MG capsule Take 1 capsule (50 mg total) by mouth 2 (two) times daily with a meal.   lidocaine-prilocaine (EMLA) cream Apply 1 application topically as needed.   milk thistle 175 MG tablet Take 175 mg by mouth daily.   Omega-3 Fatty Acids (FISH OIL PO) Take 1 capsule by mouth daily.   progesterone (PROMETRIUM) 100 MG capsule Take 1 capsule (100 mg total) by mouth daily.   [DISCONTINUED] atenolol (TENORMIN) 50 MG tablet Take 1 tablet (50 mg total) by mouth daily. NEEDS APPT   [DISCONTINUED] hydrochlorothiazide (HYDRODIURIL) 25 MG tablet Take 1  tablet (25 mg total) by mouth daily.   [DISCONTINUED] HYDROcodone-acetaminophen (NORCO) 10-325 MG tablet Take 1 tablet by mouth every 8 (eight) hours as needed.   [DISCONTINUED] omeprazole (PRILOSEC) 40 MG capsule TAKE 1 CAPSULE(40 MG) BY MOUTH DAILY   [DISCONTINUED] oxyCODONE-acetaminophen (PERCOCET/ROXICET) 5-325 MG tablet Take 1-2 tablets by mouth every 6 (six) hours as needed for up to 5 days for severe pain.    Allergies  Allergen Reactions   Chantix [Varenicline]     Dreams    Patient Active Problem List   Diagnosis Date Noted   Metatarsalgia, right foot 12/07/2020   Fracture of navicular bone of left foot 10/23/2020   Transaminitis 09/08/2018   Gout flare 08/24/2018   Adult ADHD 08/10/2018   Hypertension goal BP (blood pressure) < 130/80 08/10/2018   Slow transit constipation 04/13/2018   Menopausal vasomotor syndrome 10/02/2017   Overweight (BMI 25.0-29.9) 10/02/2017   Family history of heart attack 10/02/2017   History of cervical fracture 10/02/2017   Allergic rhinitis due to allergen 12/22/2016   Oral allergy syndrome 12/22/2016   Mastalgia 06/25/2016   Perimenopausal  vasomotor symptoms 06/25/2016   Cervical radiculitis 10/30/2015   Ovarian mass 01/05/2015   Fibroid 01/05/2015   Hyperlipidemia 07/26/2014   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   Essential hypertension 04/24/2014   Chronic headaches 04/24/2014   Generalized anxiety disorder 04/24/2014   Chronic neck pain 04/24/2014   Chronic cervical pain 01/20/2014   Headache, migraine 07/17/2012    Family History  Problem Relation Age of Onset   Heart attack Mother    Hypertension Mother    Alzheimer's disease Father    Hypertension Father    Cancer Sister        cervical   Colon cancer Neg Hx    Esophageal cancer Neg Hx     Social History   Tobacco Use  Smoking Status Former   Types: Cigarettes   Quit date: 04/24/2010   Years since quitting: 10.8  Smokeless Tobacco Never    Past Surgical History:  Procedure Laterality Date   CARPAL TUNNEL RELEASE Left    COLONOSCOPY     about 5 years ago around 2015. Possibly Cherrie Gauze   ESOPHAGOGASTRODUODENOSCOPY     about 7 years ago per patient. Around 2013   esure      TUBAL LIGATION      Immunization History  Administered Date(s) Administered   Tdap 02/08/2009, 07/25/2014   Zoster Recombinat (Shingrix) 07/27/2019, 10/25/2019    Recent Results (from the past 2160 hour(s))  Comprehensive metabolic panel     Status: Abnormal   Collection Time: 12/07/20  4:15 PM  Result Value Ref Range   Glucose, Bld 92 65 - 99 mg/dL    Comment: .            Fasting reference interval .    BUN 22 7 - 25 mg/dL   Creat 0.82 0.50 - 1.05 mg/dL    Comment: For patients >74 years of age, the reference limit for Creatinine is approximately 13% higher for people identified as African-American. .    BUN/Creatinine Ratio NOT APPLICABLE 6 - 22 (calc)   Sodium 143 135 - 146 mmol/L   Potassium 3.9 3.5 - 5.3 mmol/L   Chloride 105 98 - 110 mmol/L   CO2 29 20 - 32 mmol/L   Calcium 9.8 8.6 - 10.4 mg/dL   Total Protein 6.7 6.1 - 8.1 g/dL   Albumin 4.4 3.6  - 5.1 g/dL   Globulin 2.3 1.9 -  3.7 g/dL (calc)   AG Ratio 1.9 1.0 - 2.5 (calc)   Total Bilirubin 0.4 0.2 - 1.2 mg/dL   Alkaline phosphatase (APISO) 53 37 - 153 U/L   AST 51 (H) 10 - 35 U/L   ALT 103 (H) 6 - 29 U/L  Uric acid     Status: None   Collection Time: 12/07/20  4:15 PM  Result Value Ref Range   Uric Acid, Serum 4.8 2.5 - 7.0 mg/dL    Comment: Therapeutic target for gout patients: <6.0 mg/dL .     No results found.     All questions at time of visit were answered - patient instructed to contact office with any additional concerns or updates. ER/RTC precautions were reviewed with the patient as applicable.   Please note: manual typing as well as voice recognition software may have been used to produce this document - typos may escape review. Please contact Dr. Sheppard Coil for any needed clarifications.

## 2021-02-13 ENCOUNTER — Encounter: Payer: Self-pay | Admitting: Osteopathic Medicine

## 2021-02-15 LAB — CYTOLOGY - PAP
Adequacy: ABSENT
Comment: NEGATIVE
Diagnosis: NEGATIVE
High risk HPV: NEGATIVE

## 2021-04-01 ENCOUNTER — Encounter: Payer: Self-pay | Admitting: Family Medicine

## 2021-04-01 ENCOUNTER — Ambulatory Visit: Payer: BC Managed Care – PPO | Admitting: Family Medicine

## 2021-04-01 ENCOUNTER — Other Ambulatory Visit: Payer: Self-pay

## 2021-04-01 ENCOUNTER — Ambulatory Visit (INDEPENDENT_AMBULATORY_CARE_PROVIDER_SITE_OTHER): Payer: BC Managed Care – PPO

## 2021-04-01 VITALS — BP 127/64 | HR 71 | Temp 98.8°F | Wt 165.1 lb

## 2021-04-01 DIAGNOSIS — M79675 Pain in left toe(s): Secondary | ICD-10-CM

## 2021-04-01 DIAGNOSIS — S99922A Unspecified injury of left foot, initial encounter: Secondary | ICD-10-CM | POA: Diagnosis not present

## 2021-04-01 MED ORDER — HYDROCODONE-ACETAMINOPHEN 5-325 MG PO TABS: 1.0000 | ORAL_TABLET | Freq: Four times a day (QID) | ORAL | 0 refills | Status: AC | PRN

## 2021-04-01 NOTE — Patient Instructions (Addendum)
Xray today - we will update you with results Continue ice, rest, elevation. Wear your post-op shoe/boot and/or buddy tape the toe to neighboring toes. Three days worth of pain meds given. Otherwise, continue ibuprofen and tylenol.  Follow-up in 6 weeks or sooner if needed.

## 2021-04-01 NOTE — Progress Notes (Signed)
Acute Office Visit  Subjective:    Patient ID: Emily Ponce, female    DOB: 1965/09/08, 55 y.o.   MRN: [id number]  Chief Complaint  Patient presents with   Toe Injury    HPI Patient is in today for toe injury.  Last week patient was in [place] and ran into a pallet with most of the force hitting her 3rd left toe. Reports it did bruise and swell pretty significantly. There was an area that opened and bled, so she applied some peroxide to prevent infection and has been doing some warm water soaks. For pain management she has been trying tylenol and ibuprofen but that hasn't really helped much. Swelling and bruising are mostly gone, but pain is still 7/10 at rest in office today and gets significantly worse with weight bearing. States she can't even put on socks or closed toed shoes because of the pain. Pain is described as throbbing, deep ache on the entire toe. She denies any erythema, drainage, other foot pain.     Past Medical History:  Diagnosis Date   Anxiety    Broken neck (HCC)    c-7   GERD (gastroesophageal reflux disease)    Hypertension    Ovarian cyst     Past Surgical History:  Procedure Laterality Date   CARPAL TUNNEL RELEASE Left    COLONOSCOPY     about 5 years ago around 2015. Possibly Cherrie Gauze   ESOPHAGOGASTRODUODENOSCOPY     about 7 years ago per patient. Around 2013   esure      TUBAL LIGATION      Family History  Problem Relation Age of Onset   Heart attack Mother    Hypertension Mother    Alzheimer's disease Father    Hypertension Father    Cancer Sister        cervical   Colon cancer Neg Hx    Esophageal cancer Neg Hx     Social History   Socioeconomic History   Marital status: Married    Spouse name: Not on file   Number of children: Not on file   Years of education: Not on file   Highest education level: Not on file  Occupational History   Occupation: HR /[employer]  Tobacco Use   Smoking status: Former    Types:  Cigarettes    Quit date: 04/24/2010    Years since quitting: 10.9   Smokeless tobacco: Never  Vaping Use   Vaping Use: Never used  Substance and Sexual Activity   Alcohol use: Yes    Comment: occasional   Drug use: No   Sexual activity: Yes    Partners: Male    Birth control/protection: Surgical    Comment: Essure  Other Topics Concern   Not on file  Social History Narrative   Not on file   Social Determinants of Health   Financial Resource Strain: Not on file  Food Insecurity: Not on file  Transportation Needs: Not on file  Physical Activity: Not on file  Stress: Not on file  Social Connections: Not on file  Intimate Partner Violence: Not on file    Outpatient Medications Prior to Visit  Medication Sig Dispense Refill   allopurinol (ZYLOPRIM) 300 MG tablet Take 1 tablet (300 mg total) by mouth daily. 90 tablet 3   Ascorbic Acid (VITAMIN C PO) Take 500 Units by mouth daily.     atenolol (TENORMIN) 50 MG tablet Take 1 tablet (50 mg total) by mouth  daily. 90 tablet 3   BIOTIN 5000 PO Take 1 tablet by mouth daily.     calcium-vitamin D (OSCAL WITH D) 500-200 MG-UNIT tablet Take 1 tablet by mouth.     colchicine 0.6 MG tablet Take 1 tablet (0.6 mg total) by mouth 2 (two) times daily. 60 tablet 2   estradiol (ESTRACE) 1 MG tablet Take 1 tablet (1 mg total) by mouth daily. 90 tablet 3   Flaxseed, Linseed, (FLAX SEEDS) POWD Take 1,000 mg by mouth daily.      Glucosamine HCl (GLUCOSAMINE PO) Take 1 tablet by mouth daily.     hydrochlorothiazide (HYDRODIURIL) 25 MG tablet Take 1 tablet (25 mg total) by mouth daily. 90 tablet 3   indomethacin (INDOCIN) 50 MG capsule Take 1 capsule (50 mg total) by mouth 2 (two) times daily with a meal. 60 capsule 11   lidocaine-prilocaine (EMLA) cream Apply 1 application topically as needed. 30 g 0   milk thistle 175 MG tablet Take 175 mg by mouth daily.     Omega-3 Fatty Acids (FISH OIL PO) Take 1 capsule by mouth daily.     omeprazole (PRILOSEC)  40 MG capsule TAKE 1 CAPSULE(40 MG) BY MOUTH DAILY 90 capsule 3   progesterone (PROMETRIUM) 100 MG capsule Take 1 capsule (100 mg total) by mouth daily. 90 capsule 3   No facility-administered medications prior to visit.    Allergies  Allergen Reactions   Chantix [Varenicline]     Dreams    Review of Systems All review of systems negative except what is listed in the HPI     Objective:    Physical Exam Vitals reviewed.  Constitutional:      Appearance: Normal appearance.  Musculoskeletal:     Right lower leg: No edema.     Left lower leg: No edema.     Comments: Decreased ROM to L 3rd toe, extreme pain with light palpation anywhere on toe  Skin:    Findings: No rash.  Neurological:     Mental Status: She is alert and oriented to person, place, and time.  Psychiatric:        Mood and Affect: Mood normal.        Behavior: Behavior normal.        Thought Content: Thought content normal.        Judgment: Judgment normal.       BP 127/64 (BP Location: Left Arm, Patient Position: Sitting, Cuff Size: Normal)   Pulse 71   Temp 98.8 F (37.1 C) (Oral)   Wt 165 lb 1.9 oz (74.9 kg)   LMP  (LMP Unknown) Comment: LMP 2017  BMI 27.48 kg/m  Wt Readings from Last 3 Encounters:  04/01/21 165 lb 1.9 oz (74.9 kg)  02/12/21 163 lb (73.9 kg)  02/17/20 165 lb 8 oz (75.1 kg)    There are no preventive care reminders to display for this patient.  There are no preventive care reminders to display for this patient.   Lab Results  Component Value Date   TSH 2.04 04/14/2018   Lab Results  Component Value Date   WBC 7.2 07/27/2019   HGB 14.3 07/27/2019   HCT 42.4 07/27/2019   MCV 91.6 07/27/2019   PLT 234 07/27/2019   Lab Results  Component Value Date   NA 143 12/07/2020   K 3.9 12/07/2020   CO2 29 12/07/2020   GLUCOSE 92 12/07/2020   BUN 22 12/07/2020   CREATININE 0.82 12/07/2020   BILITOT 0.4  12/07/2020   ALKPHOS 64 11/19/2018   AST 51 (H) 12/07/2020   ALT 103  (H) 12/07/2020   PROT 6.7 12/07/2020   ALBUMIN 4.6 11/19/2018   CALCIUM 9.8 12/07/2020   Lab Results  Component Value Date   CHOL 237 (H) 07/27/2019   Lab Results  Component Value Date   HDL 60 07/27/2019   Lab Results  Component Value Date   LDLCALC 151 (H) 07/27/2019   Lab Results  Component Value Date   TRIG 131 07/27/2019   Lab Results  Component Value Date   CHOLHDL 4.0 07/27/2019   Lab Results  Component Value Date   HGBA1C 4.9 06/25/2016       Assessment & Plan:   1. Pain of toe of left foot Toe looks pretty good overall, but extremely sensitive to light palpation and causing her limp. Will xray. Reports OTC pain medication is not adequate and she cannot tolerate Tramadol - giving less than 3 days of low dose Norco. PDMP reviewed. Xray today. Continue ice, rest, elevation. States she has a boot/shoe from previous injury she plans to use, can also buddy-tape toes. Will let her know if there are any changes pending xray results. Purcell Nails Olevia Bowens, DNP, FNP-C  - DG Toe 3rd Left; Future - HYDROcodone-acetaminophen (NORCO) 5-325 MG tablet; Take 1 tablet by mouth every 6 (six) hours as needed for up to 3 days for moderate pain.  Dispense: 10 tablet; Refill: 0  Follow-up in 4-6 weeks if not improving.   Purcell Nails Olevia Bowens, DNP, FNP-C

## 2021-04-19 ENCOUNTER — Telehealth: Payer: Self-pay | Admitting: Neurology

## 2021-04-19 DIAGNOSIS — I10 Essential (primary) hypertension: Secondary | ICD-10-CM

## 2021-04-19 MED ORDER — HYDROCHLOROTHIAZIDE 25 MG PO TABS: 25.0000 mg | ORAL_TABLET | Freq: Every day | ORAL | 0 refills | Status: DC

## 2021-04-19 NOTE — Telephone Encounter (Signed)
Patient left vm stating [place]. She was wanting to know if okay to skip one day of medication, she comes back tomorrow. She also asked for a refill to get her til she is due for her next fill. She lost about 3 weeks of medication and can not find it anywhere.   I made patient aware okay to skip one day of medication, to watch salt intake and make sure she is drinking plenty of water. One month of medication sent in to her pharmacy. Aware she will probably have to pay out of pocket but advised she use a goodRX coupon.

## 2021-04-25 ENCOUNTER — Emergency Department (INDEPENDENT_AMBULATORY_CARE_PROVIDER_SITE_OTHER): Payer: BC Managed Care – PPO

## 2021-04-25 ENCOUNTER — Emergency Department
Admission: EM | Admit: 2021-04-25 | Discharge: 2021-04-25 | Disposition: A | Discharge status: Home / Self Care | Payer: BC Managed Care – PPO | Source: Home / Self Care | Attending: Family Medicine | Admitting: Family Medicine

## 2021-04-25 ENCOUNTER — Other Ambulatory Visit: Payer: Self-pay

## 2021-04-25 DIAGNOSIS — R222 Localized swelling, mass and lump, trunk: Secondary | ICD-10-CM | POA: Diagnosis not present

## 2021-04-25 DIAGNOSIS — N6320 Unspecified lump in the left breast, unspecified quadrant: Secondary | ICD-10-CM

## 2021-04-25 DIAGNOSIS — R0781 Pleurodynia: Secondary | ICD-10-CM | POA: Diagnosis not present

## 2021-04-25 DIAGNOSIS — R220 Localized swelling, mass and lump, head: Secondary | ICD-10-CM

## 2021-04-25 MED ORDER — HYDROCODONE-ACETAMINOPHEN 5-325 MG PO TABS: 1.0000 | ORAL_TABLET | Freq: Four times a day (QID) | ORAL | 0 refills | Status: DC | PRN

## 2021-04-25 MED ORDER — PREDNISONE 20 MG PO TABS: ORAL_TABLET | ORAL | 0 refills | Status: DC

## 2021-04-25 MED ORDER — MELOXICAM 15 MG PO TABS: 15.0000 mg | ORAL_TABLET | Freq: Every day | ORAL | 0 refills | Status: DC

## 2021-04-25 NOTE — Discharge Instructions (Addendum)
Take prednisone as prescribed Try reducing salt intake while on prednisone Ice or heat to painful area After you have finished the prednisone, if the pain and swelling persist, take the meloxicam once a day.  This is an anti-inflammatory medicine like ibuprofen or naproxen. Take hydrocodone if needed for severe pain.  Do not drive on hydrocodone

## 2021-04-25 NOTE — ED Triage Notes (Signed)
Pt presents to Urgent Care with c/o pain to R lower chest/RUQ of abdomen and abdominal bloating x several days. States her bowel movements have been smaller than usual; LBM this AM. States she had some nausea 2 days ago, no vomiting.

## 2021-04-25 NOTE — ED Provider Notes (Signed)
[facility]    CSN: [id number] Arrival date & time: 04/25/21  1356      History   Chief Complaint Chief Complaint  Patient presents with   Bloated   R lower chest pain    HPI Emily Ponce is a 55 y.o. female.   HPI  Patient has a swollen area below her right breast has been present for couple days.  Today it is more apparent and worse, more painful.  No pain with deep breath cough or sneeze.  No pain with movement.  No trauma to the area.  No problems with breast.  Mammograms have been normal.  She is here requesting an ultrasound. She also states she feels bloated in her abdomen.  She started a fiber challenge on Saturday and has been taking fiber supplements and eating more fiber the last couple of days.  I told her this is likely the cause of her bloating.  Her bowel movements have changed and are softer.  Had a black bowel movement this morning.  No nausea or vomiting or change in her appetite.  Past Medical History:  Diagnosis Date   Anxiety    Broken neck ([place])    c-7   GERD (gastroesophageal reflux disease)    Hypertension    Ovarian cyst     Patient Active Problem List   Diagnosis Date Noted   Metatarsalgia, right foot 12/07/2020   Fracture of navicular bone of left foot 10/23/2020   Transaminitis 09/08/2018   Gout flare 08/24/2018   Adult ADHD 08/10/2018   Hypertension goal BP (blood pressure) < 130/80 08/10/2018   Slow transit constipation 04/13/2018   Overweight (BMI 25.0-29.9) 10/02/2017   Family history of heart attack 10/02/2017   History of cervical fracture 10/02/2017   Allergic rhinitis due to allergen 12/22/2016   Oral allergy syndrome 12/22/2016   Cervical radiculitis 10/30/2015   Fibroid 01/05/2015   Hyperlipidemia 07/26/2014   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 Possibly Cherrie Gauze   ESOPHAGOGASTRODUODENOSCOPY     about 7 years ago per patient. Around 2013   esure      TUBAL LIGATION      OB History     Gravida  3   Para  2   Term      Preterm      AB  1   Living  2      SAB  1   IAB      Ectopic      Multiple      Live Births               Home Medications    Prior to Admission medications   Medication Sig Start Date End Date Taking? Authorizing Provider  HYDROcodone-acetaminophen (NORCO/VICODIN) 5-325 MG tablet Take 1-2 tablets by mouth every 6 (six) hours as needed. 04/25/21  Yes Raylene Everts, MD  meloxicam (MOBIC) 15 MG tablet Take 1 tablet (15 mg total) by mouth daily. 04/25/21  Yes Raylene Everts, MD  predniSONE (DELTASONE) 20 MG tablet Take 2 a day for 3 days then 1 a day for 3 days 04/25/21  Yes Raylene Everts, MD  allopurinol (ZYLOPRIM) 300 MG tablet Take 1 tablet (300 mg total) by mouth daily. 12/07/20   Silverio Decamp, MD  Ascorbic Acid (VITAMIN C PO) Take 500 Units by mouth daily.    [provider]  atenolol (TENORMIN) 50 MG tablet Take 1 tablet (50 mg total) by mouth daily. 02/12/21   Emeterio Reeve, DO  BIOTIN 5000 PO Take 1 tablet by mouth daily.    [provider]  calcium-vitamin D (OSCAL WITH D) 500-200 MG-UNIT tablet Take 1 tablet by mouth.    [provider]  colchicine 0.6 MG tablet Take 1 tablet (0.6 mg total) by mouth 2 (two) times daily. 11/12/20   Silverio Decamp, MD  Flaxseed, Linseed, (FLAX SEEDS) POWD Take 1,000 mg by mouth daily.     [provider]  Glucosamine HCl (GLUCOSAMINE PO) Take 1 tablet by mouth daily.    [provider]  hydrochlorothiazide (HYDRODIURIL) 25 MG tablet Take 1 tablet (25 mg total) by mouth daily. 04/19/21   Luetta Nutting, DO  milk thistle  175 MG tablet Take 175 mg by mouth daily.    [provider]  Omega-3 Fatty Acids (FISH OIL PO) Take 1 capsule by mouth daily.    [provider]  omeprazole (PRILOSEC) 40 MG capsule TAKE 1 CAPSULE(40 MG) BY MOUTH DAILY 02/12/21   Emeterio Reeve, DO    Family History Family History  Problem Relation Age of Onset   Heart attack Mother    Hypertension Mother    Alzheimer's disease Father    Hypertension Father    Cancer Sister        cervical   Colon cancer Neg Hx    Esophageal cancer Neg Hx     Social History Social History   Tobacco Use   Smoking status: Former    Types: Cigarettes    Quit date: 04/24/2010    Years since quitting: 11.0   Smokeless tobacco: Never  Vaping Use   Vaping Use: Never used  Substance Use Topics   Alcohol use: Yes    Comment: occasional   Drug use: No     Allergies   Chantix [varenicline]   Review of Systems Review of Systems See HPI  Physical Exam Triage Vital Signs ED Triage Vitals  Enc Vitals Group     BP 04/25/21 1412 125/81     Pulse Rate 04/25/21 1412 72     Resp 04/25/21 1412 20     Temp 04/25/21 1412 98.1 F (36.7 C)     Temp Source 04/25/21 1412 Oral     SpO2 04/25/21 1412 99 %     Weight 04/25/21 1407 167 lb (75.8 kg)     Height 04/25/21 1407 5\' 5"  (1.651 m)     Head Circumference --      Peak Flow --      Pain Score 04/25/21 1407 6     Pain Loc --      Pain Edu? --      Excl. in [place]? --    No data found.  Updated Vital Signs BP 125/81 (BP Location: Right Arm)   Pulse 72   Temp 98.1 F (36.7 C) (Oral)   Resp 20   Ht 5\' 5"  (1.651 m)   Wt 75.8 kg   LMP  (LMP Unknown) Comment: LMP 2017  SpO2 99%   BMI 27.79 kg/m      Physical Exam Constitutional:      General: She is not in acute distress.  Appearance: Normal appearance. She is well-developed.  HENT:     Head: Normocephalic and atraumatic.  Eyes:     Conjunctiva/sclera: Conjunctivae normal.     Pupils: Pupils are equal, round,  and reactive to light.  Cardiovascular:     Rate and Rhythm: Normal rate.  Pulmonary:     Effort: Pulmonary effort is normal. No respiratory distress.  Chest:     Chest wall: Mass, swelling and tenderness present.    Abdominal:     General: There is no distension.     Palpations: Abdomen is soft.  Musculoskeletal:        General: Normal range of motion.     Cervical back: Normal range of motion.  Skin:    General: Skin is warm and dry.  Neurological:     Mental Status: She is alert.     UC Treatments / Results  Labs (all labs ordered are listed, but only abnormal results are displayed) Labs Reviewed - No data to display  EKG   Radiology [place] CHEST SOFT TISSUE  Result Date: 04/25/2021 CLINICAL DATA:  Mass in chest inferior RIGHT breast EXAM: ULTRASOUND OF CHEST SOFT TISSUES TECHNIQUE: Ultrasound examination of the chest wall soft tissues was performed in the area of clinical concern. COMPARISON:  None. FINDINGS: Ultrasound examination of the soft tissues of the RIGHT chest wall beneath the breast demonstrate no measurable lesion. No mass or cystic collection. IMPRESSION: No discernible mass lesion identified Electronically Signed   By: Suzy Bouchard M.D.   On: 04/25/2021 16:10    Procedures Procedures (including critical care time)  Medications Ordered in UC Medications - No data to display  Initial Impression / Assessment and Plan / UC Course  I have reviewed the triage vital signs and the nursing notes.  Pertinent labs & imaging results that were available during my care of the patient were reviewed by me and considered in my medical decision making (see chart for details).     This area is visibly swollen but there is no mass under the skin.  I think this is inflammation from her costal cartilage.  We will give her prednisone for couple days and follow with Mobic.  Ice or heat.  Follow-up with primary care. Final Clinical Impressions(s) / UC Diagnoses   Final  diagnoses:  Mass in chest  Rib pain on right side     Discharge Instructions      Take prednisone as prescribed Try reducing salt intake while on prednisone Ice or heat to painful area After you have finished the prednisone, if the pain and swelling persist, take the meloxicam once a day.  This is an anti-inflammatory medicine like ibuprofen or naproxen. Take hydrocodone if needed for severe pain.  Do not drive on hydrocodone     ED Prescriptions     Medication Sig Dispense Auth. Provider   predniSONE (DELTASONE) 20 MG tablet Take 2 a day for 3 days then 1 a day for 3 days 9 tablet Raylene Everts, MD   meloxicam (MOBIC) 15 MG tablet Take 1 tablet (15 mg total) by mouth daily. 15 tablet Raylene Everts, MD   HYDROcodone-acetaminophen (NORCO/VICODIN) 5-325 MG tablet Take 1-2 tablets by mouth every 6 (six) hours as needed. 10 tablet Raylene Everts, MD      I have reviewed the PDMP during this encounter.   Raylene Everts, MD 04/25/21 [phone]

## 2021-05-20 ENCOUNTER — Ambulatory Visit: Payer: BC Managed Care – PPO | Admitting: Physician Assistant

## 2021-05-20 ENCOUNTER — Other Ambulatory Visit: Payer: Self-pay

## 2021-05-20 VITALS — BP 136/70 | HR 72 | Temp 97.8°F | Ht 65.0 in | Wt 170.0 lb

## 2021-05-20 DIAGNOSIS — K1120 Sialoadenitis, unspecified: Secondary | ICD-10-CM

## 2021-05-20 MED ORDER — PREDNISONE 50 MG PO TABS: ORAL_TABLET | ORAL | 0 refills | Status: DC

## 2021-05-20 MED ORDER — DOXYCYCLINE HYCLATE 100 MG PO TABS: 100.0000 mg | ORAL_TABLET | Freq: Two times a day (BID) | ORAL | 0 refills | Status: DC

## 2021-05-20 MED ORDER — HYDROCODONE-ACETAMINOPHEN 5-325 MG PO TABS: 1.0000 | ORAL_TABLET | Freq: Three times a day (TID) | ORAL | 0 refills | Status: AC | PRN

## 2021-05-20 NOTE — Progress Notes (Signed)
Subjective:    Patient ID: Emily Ponce, female    DOB: June 27, 1966, 55 y.o.   MRN: [id number]  HPI Pt is a 55 yo female who presents to the clinic with painful swelling of left jaw and neck for the last 5 days. She noticed the swelling one morning of the left side of face and tenderness and enlargement just under jaw line on the left side of lymph node. No ear pain, ST, cough, sinus pressure, headache. She does have some post nasal drip. No fever, chills, body aches. It is very painful. Ibuprofen and tylenol are not touching the pain. No problems eating or chewing.   .. 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   Essential hypertension 04/24/2014   Chronic headaches 04/24/2014   Generalized anxiety disorder 04/24/2014   Chronic neck pain 04/24/2014   Hyperlipidemia 07/26/2014   Fibroid 01/05/2015   Headache, migraine 07/17/2012   Cervical radiculitis 10/30/2015   Allergic rhinitis due to allergen 12/22/2016   Oral allergy syndrome 12/22/2016   Overweight (BMI 25.0-29.9) 10/02/2017   Family history of heart attack 10/02/2017   History of cervical fracture 10/02/2017   Slow transit constipation 04/13/2018   Adult ADHD 08/10/2018   Hypertension goal BP (blood pressure) < 130/80 08/10/2018   Gout flare 08/24/2018   Transaminitis 09/08/2018   Fracture of navicular bone of left foot 10/23/2020   Metatarsalgia, right foot 12/07/2020   Resolved Ambulatory Problems    Diagnosis Date Noted   Preventive measure 07/06/2014   Right foot pain 11/17/2014   Ovarian mass 01/05/2015   Aneurysm ([place]) 05/19/2011   Chronic cervical pain 01/20/2014   Menometrorrhagia 01/16/2015   Mastalgia 06/25/2016   Tinea corporis 06/25/2016   Perimenopausal vasomotor symptoms 06/25/2016   Insomnia due to other mental disorder 12/05/2016   Hoarseness of voice 01/16/2017   Menopausal vasomotor syndrome 10/02/2017   Precordial chest pain 10/02/2017   Chronic use  of benzodiazepine for therapeutic purpose 10/02/2017   Left hip pain 04/13/2018   Inattention 07/13/2018   Past Medical History:  Diagnosis Date   Anxiety    Broken neck (HCC)    GERD (gastroesophageal reflux disease)    Hypertension    Ovarian cyst      Review of Systems    See HPI.  Objective:   Physical Exam Vitals reviewed.  Constitutional:      Appearance: Normal appearance.  HENT:     Head: Normocephalic.     Right Ear: Tympanic membrane, ear canal and external ear normal. There is no impacted cerumen.     Left Ear: Tympanic membrane, ear canal and external ear normal. There is no impacted cerumen.     Nose: Nose normal.     Mouth/Throat:     Mouth: Mucous membranes are moist.     Pharynx: No posterior oropharyngeal erythema.  Eyes:     Extraocular Movements: Extraocular movements intact.     Conjunctiva/sclera: Conjunctivae normal.     Pupils: Pupils are equal, round, and reactive to light.  Neck:     Comments: Left parotid gland tenderness and enlargement. No redness or warmth.  Cardiovascular:     Rate and Rhythm: Normal rate and regular rhythm.     Pulses: Normal pulses.  Pulmonary:     Effort: Pulmonary effort is normal.     Breath sounds: Normal breath sounds.  Neurological:     General: No focal deficit present.     Mental  Status: She is alert.  Psychiatric:        Mood and Affect: Mood normal.          Assessment & Plan:  Marland KitchenMarland KitchenSihaam was seen today for adenopathy.  Diagnoses and all orders for this visit:  Parotiditis -     doxycycline (VIBRA-TABS) 100 MG tablet; Take 1 tablet (100 mg total) by mouth 2 (two) times daily. -     HYDROcodone-acetaminophen (NORCO/VICODIN) 5-325 MG tablet; Take 1 tablet by mouth every 8 (eight) hours as needed for up to 5 days for moderate pain. -     predniSONE (DELTASONE) 50 MG tablet; Take one tablet for 5 days.  I do not see signs of bacterial infection at this time. Hold doxycycline. Start prednisone, warm  compresses, tylenol and norco for break through pain. HO Given. Encouraged to chew gum and suck on hard candy.  Discussed if symptoms worsening and area getting more red, warm to touch, fever, chills then start doxycycline.  Follow up if symptoms changing or not improving.

## 2021-05-20 NOTE — Patient Instructions (Signed)
Parotitis Parotitis means that you have irritation and swelling (inflammation) in one or both of your parotid glands. These glands make saliva. They are found on each side of your face, below and in front of your earlobes. You may or may not have pain with this condition. What are the causes? This condition may be caused by: Infections from germs (bacteria or viruses). Something blocking the flow of saliva through the parotid glands. This can be a stone, scar tissue, or a tumor. Diseases that cause your body's defense system (immune system) to attack healthy cells in your salivary glands. These are called autoimmune diseases. What increases the risk? Being 57 years old or older. Not drinking enough fluids (being dehydrated). Drinking too much alcohol. Having: A dry mouth. Diabetes. Gout. A long-term illness. Not taking good care of your mouth and teeth (poor oral hygiene). Having had radiation treatments to the head and neck. Taking certain medicines. What are the signs or symptoms? Swelling under and in front of the ear. This may get worse after you eat. Pain and tenderness over the parotid gland. This may get worse after you eat. Redness and warmth of the skin over the parotid gland. Fever or chills. Pus coming from the ducts inside the mouth. Dry mouth. A bad taste in the mouth. How is this treated? Treatment depends on the cause. It may include: Antibiotic medicine for an infection from bacteria. NSAIDs, such as ibuprofen, to treat pain and swelling. Drinking more fluids. Removing a stone or obstruction. Treating a disease that is causing parotitis. Surgery to drain an infection, remove a growth, or remove the whole gland. Treatment may not be needed if the swelling goes away with home care. Follow these instructions at home: Medicines  Take over-the-counter and prescription medicines only as told by your doctor. If you were prescribed an antibiotic medicine, take it as  told by your doctor. Do not stop taking it even if you start to feel better. Managing pain and swelling If told, put heat on the affected area. Do this as often as told by your doctor. Use the heat source that your doctor recommends, such as a moist heat pack or a heating pad. Place a towel between your skin and the heat source. Leave the heat on for 20-30 minutes. Take off the heat if your skin turns bright red. This is very important. If you cannot feel pain, heat, or cold, you have a greater risk of getting burned. Gargle with salt water 3-4 times a day or as needed. To make salt water, dissolve -1 tsp (3-6 g) of salt in 1 cup (237 mL) of warm water. Gently rub your parotid glands as told by your doctor. General instructions  Drink enough fluid to keep your pee (urine) pale yellow. Keep your mouth clean and moist. Suck on sour candy. This may help to: Make your mouth less dry. Make more saliva. Take good care of your mouth: Brush your teeth at least two times a day. Floss your teeth every day. See your dentist regularly. Do not smoke or use any products that contain nicotine or tobacco. If you need help quitting, ask your doctor. Do not drink alcohol. Keep all follow-up visits. Contact a doctor if: You have a fever or chills. You have new symptoms. Your symptoms get worse. Your symptoms do not get better with treatment. Get help right away if: You have trouble breathing or swallowing. These symptoms may be an emergency. Get help right away. Call your local  emergency services (911 in the [place] Do not wait to see if the symptoms will go away. Do not drive yourself to the hospital. Summary Parotitis means that you have irritation and swelling (inflammation) in one or both of your parotid glands. Symptoms include pain and swelling under and in front of the ear. Treatment for parotitis depends on the cause. In some cases, the condition may go away on its own with home care. You  should drink plenty of fluids, take good care of your mouth, and do not use products that contain nicotine or tobacco. This information is not intended to replace advice given to you by your health care provider. Make sure you discuss any questions you have with your health care provider. Document Revised: 11/02/2020 Document Reviewed: 11/02/2020 Elsevier Patient Education  [place].

## 2021-05-21 ENCOUNTER — Encounter: Payer: Self-pay | Admitting: Physician Assistant

## 2021-05-29 ENCOUNTER — Ambulatory Visit: Payer: BC Managed Care – PPO

## 2021-06-05 ENCOUNTER — Ambulatory Visit: Payer: BC Managed Care – PPO

## 2021-06-06 ENCOUNTER — Ambulatory Visit: Payer: BC Managed Care – PPO

## 2021-06-12 ENCOUNTER — Ambulatory Visit (INDEPENDENT_AMBULATORY_CARE_PROVIDER_SITE_OTHER): Payer: BC Managed Care – PPO

## 2021-06-12 ENCOUNTER — Other Ambulatory Visit: Payer: Self-pay

## 2021-06-12 DIAGNOSIS — Z1231 Encounter for screening mammogram for malignant neoplasm of breast: Secondary | ICD-10-CM

## 2021-06-13 NOTE — Progress Notes (Signed)
Please call patient. Normal mammogram.  Repeat in 1 year.  

## 2021-06-21 ENCOUNTER — Telehealth: Payer: BC Managed Care – PPO | Admitting: Physician Assistant

## 2021-06-21 DIAGNOSIS — J218 Acute bronchiolitis due to other specified organisms: Secondary | ICD-10-CM | POA: Diagnosis not present

## 2021-06-21 DIAGNOSIS — B9789 Other viral agents as the cause of diseases classified elsewhere: Secondary | ICD-10-CM

## 2021-06-21 MED ORDER — PREDNISONE 10 MG (21) PO TBPK: ORAL_TABLET | ORAL | 0 refills | Status: DC

## 2021-06-21 MED ORDER — BENZONATATE 100 MG PO CAPS: 100.0000 mg | ORAL_CAPSULE | Freq: Three times a day (TID) | ORAL | 0 refills | Status: DC | PRN

## 2021-06-21 NOTE — Progress Notes (Signed)
We are sorry that you are not feeling well.  Here is how we plan to help!  Based on your presentation I believe you most likely have A cough due to a virus.  This is called viral bronchitis and is best treated by rest, plenty of fluids and control of the cough.  You may use Ibuprofen or Tylenol as directed to help your symptoms.     In addition you may use A prescription cough medication called Tessalon Perles 100mg . You may take 1-2 capsules every 8 hours as needed for your cough.  Prednisone 10 mg daily for 6 days (see taper instructions below)  Directions for 6 day taper: Day 1: 2 tablets before breakfast, 1 after both lunch & dinner and 2 at bedtime Day 2: 1 tab before breakfast, 1 after both lunch & dinner and 2 at bedtime Day 3: 1 tab at each meal & 1 at bedtime Day 4: 1 tab at breakfast, 1 at lunch, 1 at bedtime Day 5: 1 tab at breakfast & 1 tab at bedtime Day 6: 1 tab at breakfast  From your responses in the eVisit questionnaire you describe inflammation in the upper respiratory tract which is causing a significant cough.  This is commonly called Bronchitis and has four common causes:   Allergies Viral Infections Acid Reflux Bacterial Infection Allergies, viruses and acid reflux are treated by controlling symptoms or eliminating the cause. An example might be a cough caused by taking certain blood pressure medications. You stop the cough by changing the medication. Another example might be a cough caused by acid reflux. Controlling the reflux helps control the cough.  USE OF BRONCHODILATOR ("RESCUE") INHALERS: There is a risk from using your bronchodilator too frequently.  The risk is that over-reliance on a medication which only relaxes the muscles surrounding the breathing tubes can reduce the effectiveness of medications prescribed to reduce swelling and congestion of the tubes themselves.  Although you feel brief relief from the bronchodilator inhaler, your asthma may actually be  worsening with the tubes becoming more swollen and filled with mucus.  This can delay other crucial treatments, such as oral steroid medications. If you need to use a bronchodilator inhaler daily, several times per day, you should discuss this with your provider.  There are probably better treatments that could be used to keep your asthma under control.     HOME CARE Only take medications as instructed by your medical team. Complete the entire course of an antibiotic. Drink plenty of fluids and get plenty of rest. Avoid close contacts especially the very young and the elderly Cover your mouth if you cough or cough into your sleeve. Always remember to wash your hands A steam or ultrasonic humidifier can help congestion.   GET HELP RIGHT AWAY IF: You develop worsening fever. You become short of breath You cough up blood. Your symptoms persist after you have completed your treatment plan MAKE SURE YOU  Understand these instructions. Will watch your condition. Will get help right away if you are not doing well or get worse.    Thank you for choosing an e-visit.  Your e-visit answers were reviewed by a board certified advanced clinical practitioner to complete your personal care plan. Depending upon the condition, your plan could have included both over the counter or prescription medications.  Please review your pharmacy choice. Make sure the pharmacy is open so you can pick up prescription now. If there is a problem, you may contact your provider  through [employer] and have the prescription routed to another pharmacy.  Your safety is important to [place]. If you have drug allergies check your prescription carefully.   For the next 24 hours you can use MyChart to ask questions about today's visit, request a non-urgent call back, or ask for a work or school excuse. You will get an email in the next two days asking about your experience. I hope that your e-visit has been valuable and will  speed your recovery.  I provided 5 minutes of non face-to-face time during this encounter for chart review and documentation.

## 2021-07-16 ENCOUNTER — Emergency Department
Admission: EM | Admit: 2021-07-16 | Discharge: 2021-07-16 | Disposition: A | Discharge status: Home / Self Care | Payer: BC Managed Care – PPO | Source: Home / Self Care

## 2021-07-16 ENCOUNTER — Other Ambulatory Visit: Payer: Self-pay

## 2021-07-16 DIAGNOSIS — S46912A Strain of unspecified muscle, fascia and tendon at shoulder and upper arm level, left arm, initial encounter: Secondary | ICD-10-CM

## 2021-07-16 MED ORDER — PREDNISONE 20 MG PO TABS: ORAL_TABLET | ORAL | 0 refills | Status: DC

## 2021-07-16 NOTE — ED Triage Notes (Addendum)
Patient presents to Urgent Care with complaints of left arm pain since Saturday. Pt states pain had resolved and today the pain returned after walking her dog. Pt states she is concerned with possible cardiac event. Treated pain on Saturday with ASA. Pt states she has a hx of left shoulder injury.   Denies chest pain, SOB, N/V.

## 2021-07-16 NOTE — ED Provider Notes (Signed)
[facility]    CSN: [id number] Arrival date & time: 07/16/21  1116      History   Chief Complaint Chief Complaint  Patient presents with   Shoulder Injury    Left arm     HPI Emily Ponce is a 56 y.o. female.   HPI 56 year old female presents of left upper arm pain for 3 days.  Patient reports pain had resolved then returned today after working her dog.  Patient is concerned about cardiac event.  Patient reports history of left shoulder pain/injury.  PMH significant for brain aneurysm, HTN, and chronic neck pain.  Past Medical History:  Diagnosis Date   Anxiety    Broken neck ([place])    c-7   GERD (gastroesophageal reflux disease)    Hypertension    Ovarian cyst     Patient Active Problem List   Diagnosis Date Noted   Metatarsalgia, right foot 12/07/2020   Fracture of navicular bone of left foot 10/23/2020   Transaminitis 09/08/2018   Gout flare 08/24/2018   Adult ADHD 08/10/2018   Hypertension goal BP (blood pressure) < 130/80 08/10/2018   Slow transit constipation 04/13/2018   Overweight (BMI 25.0-29.9) 10/02/2017   Family history of heart attack 10/02/2017   History of cervical fracture 10/02/2017   Allergic rhinitis due to allergen 12/22/2016   Oral allergy syndrome 12/22/2016   Cervical radiculitis 10/30/2015   Fibroid 01/05/2015   Hyperlipidemia 07/26/2014   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   Essential hypertension 04/24/2014   Chronic headaches 04/24/2014   Generalized anxiety disorder 04/24/2014   Chronic neck pain 04/24/2014   Headache, migraine 07/17/2012    Past Surgical History:  Procedure Laterality Date   CARPAL TUNNEL RELEASE Left    COLONOSCOPY     about 5 years ago around 2015. Possibly Cherrie Gauze   ESOPHAGOGASTRODUODENOSCOPY     about 7 years ago per patient. Around 2013   esure      TUBAL LIGATION      OB History     Gravida  3   Para  2   Term      Preterm      AB  1   Living  2      SAB  1    IAB      Ectopic      Multiple      Live Births               Home Medications    Prior to Admission medications   Medication Sig Start Date End Date Taking? Authorizing Provider  predniSONE (DELTASONE) 20 MG tablet Take 3 tabs PO daily x 5 days. 07/16/21  Yes Eliezer Lofts, FNP  allopurinol (ZYLOPRIM) 300 MG tablet Take 1 tablet (300 mg total) by mouth daily. 12/07/20   Silverio Decamp, MD  Ascorbic Acid (VITAMIN C PO) Take 500 Units by mouth daily.    [provider]  atenolol (TENORMIN) 50 MG tablet Take 1 tablet (50 mg total) by mouth daily. 02/12/21   Emeterio Reeve, DO  benzonatate (TESSALON) 100 MG capsule Take 1 capsule (100 mg total) by mouth 3 (three) times daily as needed. 06/21/21   Mar Daring, PA-C  BIOTIN 5000 PO Take 1 tablet by mouth daily.    [provider]  calcium-vitamin D (OSCAL WITH D) 500-200 MG-UNIT tablet Take 1 tablet by mouth.    [provider]  colchicine 0.6 MG tablet Take 1 tablet (0.6 mg total) by  mouth 2 (two) times daily. 11/12/20   Silverio Decamp, MD  doxycycline (VIBRA-TABS) 100 MG tablet Take 1 tablet (100 mg total) by mouth 2 (two) times daily. 05/20/21   Breeback, Jade L, PA-C  Flaxseed, Linseed, (FLAX SEEDS) POWD Take 1,000 mg by mouth daily.     [provider]  Glucosamine HCl (GLUCOSAMINE PO) Take 1 tablet by mouth daily.    [provider]  hydrochlorothiazide (HYDRODIURIL) 25 MG tablet Take 1 tablet (25 mg total) by mouth daily. 04/19/21   Luetta Nutting, DO  meloxicam (MOBIC) 15 MG tablet Take 1 tablet (15 mg total) by mouth daily. 04/25/21   Raylene Everts, MD  milk thistle 175 MG tablet Take 175 mg by mouth daily.    [provider]  Omega-3 Fatty Acids (FISH OIL PO) Take 1 capsule by mouth daily.    [provider]  omeprazole (PRILOSEC) 40 MG capsule TAKE 1 CAPSULE(40 MG) BY MOUTH DAILY 02/12/21   Emeterio Reeve, DO    Family  History Family History  Problem Relation Age of Onset   Heart attack Mother    Hypertension Mother    Alzheimer's disease Father    Hypertension Father    Cancer Sister        cervical   Colon cancer Neg Hx    Esophageal cancer Neg Hx     Social History Social History   Tobacco Use   Smoking status: Former    Types: Cigarettes    Quit date: 04/24/2010    Years since quitting: 11.2   Smokeless tobacco: Never  Vaping Use   Vaping Use: Never used  Substance Use Topics   Alcohol use: Yes    Comment: occasional   Drug use: No     Allergies   Chantix [varenicline]   Review of Systems Review of Systems  Musculoskeletal:        On and off left shoulder pain x3 days    Physical Exam Triage Vital Signs ED Triage Vitals  Enc Vitals Group     BP 07/16/21 1144 (!) 146/79     Pulse Rate 07/16/21 1144 66     Resp 07/16/21 1144 16     Temp 07/16/21 1144 98.3 F (36.8 C)     Temp Source 07/16/21 1144 Oral     SpO2 07/16/21 1144 99 %     Weight --      Height --      Head Circumference --      Peak Flow --      Pain Score 07/16/21 1145 7     Pain Loc --      Pain Edu? --      Excl. in [place]? --    No data found.  Updated Vital Signs BP (!) 146/79 (BP Location: Left Arm)    Pulse 66    Temp 98.3 F (36.8 C) (Oral)    Resp 16    LMP  (LMP Unknown) Comment: LMP 2017   SpO2 99%      Physical Exam Vitals and nursing note reviewed.  Constitutional:      General: She is not in acute distress.    Appearance: Normal appearance. She is obese.  HENT:     Head: Normocephalic and atraumatic.     Mouth/Throat:     Mouth: Mucous membranes are moist.     Pharynx: Oropharynx is clear.  Eyes:     Extraocular Movements: Extraocular movements intact.  Conjunctiva/sclera: Conjunctivae normal.     Pupils: Pupils are equal, round, and reactive to light.  Cardiovascular:     Rate and Rhythm: Normal rate and regular rhythm.     Pulses: Normal pulses.     Heart sounds:  Normal heart sounds.  Pulmonary:     Effort: Pulmonary effort is normal.     Breath sounds: Normal breath sounds.  Musculoskeletal:     Cervical back: Normal range of motion and neck supple.     Comments: Left shoulder (anterior/medial aspect): Non-TTP over GH and AC joints, limited range of motion with forward flexion, horizontal abduction, and external rotation  Skin:    General: Skin is warm and dry.  Neurological:     General: No focal deficit present.     Mental Status: She is alert and oriented to person, place, and time.     UC Treatments / Results  Labs (all labs ordered are listed, but only abnormal results are displayed) Labs Reviewed - No data to display  EKG   Radiology No results found.  Procedures Procedures (including critical care time)  Medications Ordered in UC Medications - No data to display  Initial Impression / Assessment and Plan / UC Course  I have reviewed the triage vital signs and the nursing notes.  Pertinent labs & imaging results that were available during my care of the patient were reviewed by me and considered in my medical decision making (see chart for details).     MDM: 1.  Left shoulder strain, initial encounter-Advised patient EKG was within normal limits this afternoon, normal sinus rhythm.  Advised patient to take medication as directed with food to completion.  Encouraged patient increase daily water intake while taking this medication.  Discharged home, hemodynamically stable. Final Clinical Impressions(s) / UC Diagnoses   Final diagnoses:  Left shoulder strain, initial encounter     Discharge Instructions      Advised patient EKG was within normal limits this afternoon, normal sinus rhythm.  Advised patient to take medication as directed with food to completion.  Encouraged patient increase daily water intake while taking this medication.     ED Prescriptions     Medication Sig Dispense Auth. Provider   predniSONE  (DELTASONE) 20 MG tablet Take 3 tabs PO daily x 5 days. 15 tablet Eliezer Lofts, FNP      PDMP not reviewed this encounter.   Eliezer Lofts, [place] 07/16/21 1249

## 2021-07-16 NOTE — Discharge Instructions (Addendum)
Advised patient EKG was within normal limits this afternoon, normal sinus rhythm.  Advised patient to take medication as directed with food to completion.  Encouraged patient increase daily water intake while taking this medication.

## 2021-08-19 DIAGNOSIS — R7989 Other specified abnormal findings of blood chemistry: Secondary | ICD-10-CM | POA: Diagnosis not present

## 2021-08-19 DIAGNOSIS — Z87891 Personal history of nicotine dependence: Secondary | ICD-10-CM | POA: Diagnosis not present

## 2021-08-19 DIAGNOSIS — R079 Chest pain, unspecified: Secondary | ICD-10-CM | POA: Diagnosis not present

## 2021-08-19 DIAGNOSIS — Z888 Allergy status to other drugs, medicaments and biological substances status: Secondary | ICD-10-CM | POA: Diagnosis not present

## 2021-08-19 DIAGNOSIS — Z79899 Other long term (current) drug therapy: Secondary | ICD-10-CM | POA: Diagnosis not present

## 2021-08-19 DIAGNOSIS — R0602 Shortness of breath: Secondary | ICD-10-CM | POA: Diagnosis not present

## 2021-08-19 DIAGNOSIS — R10819 Abdominal tenderness, unspecified site: Secondary | ICD-10-CM | POA: Diagnosis not present

## 2021-08-19 DIAGNOSIS — R945 Abnormal results of liver function studies: Secondary | ICD-10-CM | POA: Diagnosis not present

## 2021-08-19 DIAGNOSIS — R7401 Elevation of levels of liver transaminase levels: Secondary | ICD-10-CM | POA: Diagnosis not present

## 2021-08-19 DIAGNOSIS — K760 Fatty (change of) liver, not elsewhere classified: Secondary | ICD-10-CM | POA: Diagnosis not present

## 2021-08-19 DIAGNOSIS — R0789 Other chest pain: Secondary | ICD-10-CM | POA: Diagnosis not present

## 2021-08-19 DIAGNOSIS — I10 Essential (primary) hypertension: Secondary | ICD-10-CM | POA: Diagnosis not present

## 2021-08-20 DIAGNOSIS — R0789 Other chest pain: Secondary | ICD-10-CM | POA: Diagnosis not present

## 2021-09-05 ENCOUNTER — Ambulatory Visit: Payer: BC Managed Care – PPO | Admitting: Medical-Surgical

## 2021-09-05 ENCOUNTER — Other Ambulatory Visit: Payer: Self-pay

## 2021-09-05 ENCOUNTER — Encounter: Payer: Self-pay | Admitting: Medical-Surgical

## 2021-09-05 VITALS — BP 115/68 | HR 62 | Temp 97.8°F | Ht 65.0 in | Wt 163.0 lb

## 2021-09-05 DIAGNOSIS — Z7689 Persons encountering health services in other specified circumstances: Secondary | ICD-10-CM

## 2021-09-05 DIAGNOSIS — I10 Essential (primary) hypertension: Secondary | ICD-10-CM | POA: Diagnosis not present

## 2021-09-05 DIAGNOSIS — R748 Abnormal levels of other serum enzymes: Secondary | ICD-10-CM

## 2021-09-05 NOTE — Progress Notes (Signed)
?  HPI with pertinent ROS:  ? ?CC: transfer of care ? ?HPI: ?Pleasant 56 year old female presenting for transfer of care to a new PCP. She has been doing well overall and has no significant concerns today. Recently seen in the ED for sudden onset, severe chest pain. Was evaluated fully with no concerning findings outside of elevated liver enzymes. Has not had a recurrence of chest pain since then. Feels that maybe it was related to eating too fast.  Also notes that she had participated in a celebration for the Super Bowl and wonders if alcohol consumption during that time could have elevated her liver enzymes.  Takes Tylenol rarely and otherwise does not drink. ? ?Hypertension-taking atenolol 50 mg daily and hydrochlorothiazide 25 mg daily, tolerating well without side effects. Denies CP, SOB, palpitations, lower extremity edema, dizziness, headaches, or vision changes. ? ?I reviewed the past medical history, family history, social history, surgical history, and allergies today and no changes were needed.  Please see the problem list section below in epic for further details. ? ?Physical exam:  ? ?General: Well Developed, well nourished, and in no acute distress.  ?Neuro: Alert and oriented x3.  ?HEENT: Normocephalic, atraumatic.  ?Skin: Warm and dry. ?Cardiac: Regular rate and rhythm, no murmurs rubs or gallops, no lower extremity edema.  ?Respiratory: Clear to auscultation bilaterally. Not using accessory muscles, speaking in full sentences. ? ?Impression and Recommendations:   ? ?1. Encounter to establish care ?Reviewed available information and discussed care concerns with patient.  ? ?2. Essential hypertension ?Well-controlled at 115/68.  Continue atenolol and hydrochlorothiazide as prescribed. ? ?3. Elevated liver enzymes ?On review of labs in care everywhere, ALT at 260, AST at 148 with normal bilirubin and alkaline phosphatase.  Rechecking liver enzymes today. ?- COMPLETE METABOLIC PANEL WITH GFR ? ?Return in  about 6 months (around 03/08/2022) for chronic disease follow up. ?___________________________________________ ?Clearnce Sorrel, DNP, APRN, FNP-BC ?Primary Care and Sports Medicine ?[place] ?

## 2021-09-06 ENCOUNTER — Encounter: Payer: Self-pay | Admitting: Medical-Surgical

## 2021-09-06 LAB — COMPLETE METABOLIC PANEL WITH GFR
AG Ratio: 1.8 (calc) (ref 1.0–2.5)
ALT: 310 U/L — ABNORMAL HIGH (ref 6–29)
AST: 205 U/L — ABNORMAL HIGH (ref 10–35)
Albumin: 4.8 g/dL (ref 3.6–5.1)
Alkaline phosphatase (APISO): 64 U/L (ref 37–153)
BUN: 21 mg/dL (ref 7–25)
CO2: 30 mmol/L (ref 20–32)
Calcium: 10.3 mg/dL (ref 8.6–10.4)
Chloride: 98 mmol/L (ref 98–110)
Creat: 0.92 mg/dL (ref 0.50–1.03)
Globulin: 2.7 g/dL (calc) (ref 1.9–3.7)
Glucose, Bld: 90 mg/dL (ref 65–99)
Potassium: 4.1 mmol/L (ref 3.5–5.3)
Sodium: 140 mmol/L (ref 135–146)
Total Bilirubin: 0.5 mg/dL (ref 0.2–1.2)
Total Protein: 7.5 g/dL (ref 6.1–8.1)
eGFR: 73 mL/min/{1.73_m2} (ref >=60)

## 2021-09-06 NOTE — Addendum Note (Signed)
Addended bySamuel Bouche on: 09/06/2021 07:45 AM ? ? Modules accepted: Orders ? ?

## 2021-09-18 ENCOUNTER — Encounter: Payer: Self-pay | Admitting: Medical-Surgical

## 2021-09-18 DIAGNOSIS — R748 Abnormal levels of other serum enzymes: Secondary | ICD-10-CM | POA: Diagnosis not present

## 2021-09-19 LAB — LIPID PANEL
Cholesterol: 238 mg/dL — ABNORMAL HIGH (ref <200)
HDL: 49 mg/dL — ABNORMAL LOW (ref >=50)
LDL Cholesterol (Calc): 149 mg/dL (calc) — ABNORMAL HIGH
Non-HDL Cholesterol (Calc): 189 mg/dL (calc) — ABNORMAL HIGH (ref <130)
Total CHOL/HDL Ratio: 4.9 (calc) (ref <5.0)
Triglycerides: 263 mg/dL — ABNORMAL HIGH (ref <150)

## 2021-09-19 LAB — COMPLETE METABOLIC PANEL WITH GFR
AG Ratio: 1.7 (calc) (ref 1.0–2.5)
ALT: 175 U/L — ABNORMAL HIGH (ref 6–29)
AST: 110 U/L — ABNORMAL HIGH (ref 10–35)
Albumin: 4.6 g/dL (ref 3.6–5.1)
Alkaline phosphatase (APISO): 64 U/L (ref 37–153)
BUN: 18 mg/dL (ref 7–25)
CO2: 30 mmol/L (ref 20–32)
Calcium: 10 mg/dL (ref 8.6–10.4)
Chloride: 100 mmol/L (ref 98–110)
Creat: 0.96 mg/dL (ref 0.50–1.03)
Globulin: 2.7 g/dL (calc) (ref 1.9–3.7)
Glucose, Bld: 80 mg/dL (ref 65–139)
Potassium: 3.9 mmol/L (ref 3.5–5.3)
Sodium: 141 mmol/L (ref 135–146)
Total Bilirubin: 0.5 mg/dL (ref 0.2–1.2)
Total Protein: 7.3 g/dL (ref 6.1–8.1)
eGFR: 69 mL/min/{1.73_m2} (ref >=60)

## 2021-09-19 LAB — FERRITIN: Ferritin: 215 ng/mL (ref 16–232)

## 2021-09-19 LAB — GAMMA GT: GGT: 31 U/L (ref 3–70)

## 2021-09-19 LAB — CP4508-PT/INR AND PTT
INR: 1
Prothrombin Time: 10.1 s (ref 9.0–11.5)
aPTT: 25 s (ref 23–32)

## 2021-09-19 LAB — TSH: TSH: 1.53 mIU/L (ref 0.40–4.50)

## 2021-12-09 DIAGNOSIS — L820 Inflamed seborrheic keratosis: Secondary | ICD-10-CM | POA: Diagnosis not present

## 2021-12-09 DIAGNOSIS — L814 Other melanin hyperpigmentation: Secondary | ICD-10-CM | POA: Diagnosis not present

## 2021-12-09 DIAGNOSIS — X32XXXS Exposure to sunlight, sequela: Secondary | ICD-10-CM | POA: Diagnosis not present

## 2021-12-09 DIAGNOSIS — L237 Allergic contact dermatitis due to plants, except food: Secondary | ICD-10-CM | POA: Diagnosis not present

## 2021-12-09 DIAGNOSIS — Z129 Encounter for screening for malignant neoplasm, site unspecified: Secondary | ICD-10-CM | POA: Diagnosis not present

## 2021-12-09 DIAGNOSIS — L570 Actinic keratosis: Secondary | ICD-10-CM | POA: Diagnosis not present

## 2022-02-02 ENCOUNTER — Other Ambulatory Visit: Payer: Self-pay | Admitting: Osteopathic Medicine

## 2022-02-02 DIAGNOSIS — E782 Mixed hyperlipidemia: Secondary | ICD-10-CM

## 2022-02-02 DIAGNOSIS — I10 Essential (primary) hypertension: Secondary | ICD-10-CM

## 2022-03-11 ENCOUNTER — Ambulatory Visit: Payer: BC Managed Care – PPO | Admitting: Medical-Surgical

## 2022-04-04 ENCOUNTER — Encounter: Payer: Self-pay | Admitting: Medical-Surgical

## 2022-04-04 ENCOUNTER — Ambulatory Visit: Payer: BC Managed Care – PPO | Admitting: Medical-Surgical

## 2022-04-04 VITALS — BP 120/70 | HR 62 | Resp 20 | Ht 65.0 in | Wt 165.9 lb

## 2022-04-04 DIAGNOSIS — I10 Essential (primary) hypertension: Secondary | ICD-10-CM | POA: Diagnosis not present

## 2022-04-04 DIAGNOSIS — R748 Abnormal levels of other serum enzymes: Secondary | ICD-10-CM | POA: Diagnosis not present

## 2022-04-04 DIAGNOSIS — K219 Gastro-esophageal reflux disease without esophagitis: Secondary | ICD-10-CM

## 2022-04-04 DIAGNOSIS — E782 Mixed hyperlipidemia: Secondary | ICD-10-CM

## 2022-04-04 DIAGNOSIS — Z23 Encounter for immunization: Secondary | ICD-10-CM

## 2022-04-04 DIAGNOSIS — F909 Attention-deficit hyperactivity disorder, unspecified type: Secondary | ICD-10-CM

## 2022-04-04 DIAGNOSIS — F411 Generalized anxiety disorder: Secondary | ICD-10-CM

## 2022-04-04 DIAGNOSIS — R519 Headache, unspecified: Secondary | ICD-10-CM

## 2022-04-04 DIAGNOSIS — Z2821 Immunization not carried out because of patient refusal: Secondary | ICD-10-CM

## 2022-04-04 DIAGNOSIS — K5901 Slow transit constipation: Secondary | ICD-10-CM

## 2022-04-04 MED ORDER — OMEPRAZOLE 40 MG PO CPDR: DELAYED_RELEASE_CAPSULE | ORAL | 1 refills | Status: DC

## 2022-04-04 MED ORDER — ATENOLOL 50 MG PO TABS
ORAL_TABLET | ORAL | 1 refills | Status: DC
Start: 2022-04-04 — End: 2022-10-20

## 2022-04-04 MED ORDER — HYDROCHLOROTHIAZIDE 25 MG PO TABS: ORAL_TABLET | ORAL | 1 refills | Status: DC

## 2022-04-04 NOTE — Progress Notes (Signed)
   Established Patient Office Visit  Subjective   Patient ID: Emily Ponce, female   DOB: [birth year] Age: 56 y.o. MRN: [id number]   Chief Complaint  Patient presents with  . Follow-up    HPI    Objective:    Vitals:   04/04/22 1556  BP: 120/70  Pulse: 62  Resp: 20  Height: '5\' 5"'$  (1.651 m)  Weight: 165 lb 14.4 oz (75.3 kg)  SpO2: 97%  BMI (Calculated): 27.61    Physical Exam   No results found for this or any previous visit (from the past 24 hour(s)).   {Labs (Optional):23779}  The 10-year ASCVD risk score (Arnett DK, et al., 2019) is: 4%   Values used to calculate the score:     Age: 51 years     Sex: Female     Is Non-Hispanic African American: No     Diabetic: No     Tobacco smoker: No     Systolic Blood Pressure: 568 mmHg     Is BP treated: Yes     HDL Cholesterol: 49 mg/dL     Total Cholesterol: 238 mg/dL   Assessment & Plan:   No problem-specific Assessment & Plan notes found for this encounter.   No follow-ups on file.  ___________________________________________ Clearnce Sorrel, DNP, APRN, FNP-BC Primary Care and [place]

## 2022-04-05 LAB — HEPATIC FUNCTION PANEL
AG Ratio: 2 (calc) (ref 1.0–2.5)
ALT: 328 U/L — ABNORMAL HIGH (ref 6–29)
AST: 178 U/L — ABNORMAL HIGH (ref 10–35)
Albumin: 4.6 g/dL (ref 3.6–5.1)
Alkaline phosphatase (APISO): 61 U/L (ref 37–153)
Bilirubin, Direct: 0.1 mg/dL (ref 0.0–0.2)
Globulin: 2.3 g/dL (calc) (ref 1.9–3.7)
Indirect Bilirubin: 0.7 mg/dL (calc) (ref 0.2–1.2)
Total Bilirubin: 0.8 mg/dL (ref 0.2–1.2)
Total Protein: 6.9 g/dL (ref 6.1–8.1)

## 2022-05-03 ENCOUNTER — Other Ambulatory Visit: Payer: Self-pay | Admitting: Osteopathic Medicine

## 2022-05-07 NOTE — Telephone Encounter (Signed)
[facility] requesting med refills for progesterone and estradiol. Rxs not listed in active med list.

## 2022-05-15 DIAGNOSIS — R92333 Mammographic heterogeneous density, bilateral breasts: Secondary | ICD-10-CM | POA: Diagnosis not present

## 2022-05-15 DIAGNOSIS — Z1231 Encounter for screening mammogram for malignant neoplasm of breast: Secondary | ICD-10-CM | POA: Diagnosis not present

## 2022-05-15 LAB — HM MAMMOGRAPHY

## 2022-06-03 ENCOUNTER — Encounter: Payer: Self-pay | Admitting: Medical-Surgical

## 2022-07-02 ENCOUNTER — Encounter: Payer: Self-pay | Admitting: Family Medicine

## 2022-07-02 ENCOUNTER — Telehealth (INDEPENDENT_AMBULATORY_CARE_PROVIDER_SITE_OTHER): Payer: BC Managed Care – PPO | Admitting: Family Medicine

## 2022-07-02 VITALS — BP 132/81 | HR 106 | Temp 98.9°F

## 2022-07-02 DIAGNOSIS — A084 Viral intestinal infection, unspecified: Secondary | ICD-10-CM

## 2022-07-02 MED ORDER — ONDANSETRON 8 MG PO TBDP: 8.0000 mg | ORAL_TABLET | Freq: Three times a day (TID) | ORAL | 0 refills | Status: DC | PRN

## 2022-07-02 NOTE — Progress Notes (Signed)
I connected with  Emily Ponce on 07/02/22 by a video enabled telemedicine application and verified that I am speaking with the correct person using two identifiers.   I discussed the limitations of evaluation and management by telemedicine. The patient expressed understanding and agreed to proceed.  Acute Office Visit  Subjective:     Patient ID: Emily Ponce, female    DOB: [birth year], 56 y.o.   MRN: [id number]  Chief Complaint  Patient presents with   Acute Visit    Virtual visit- fever ( highest 101.7) , chills, diarrhea, vomiting, x yesterday a.m. - covid test at home yesterday was negative( oatient states test was expired x 1 year)     HPI Patient is in today for acute visit. Pt reports on Tuesday morning, she woke up around 2 am to use the restroom. At that time, she had diarrhea and vomiting at the same time. She says that whole day she had diarrhea with headaches and chills. Her highest temp was 101.7. she reports she has bilateral rib pain from vomiting which has since subsided. She still has diarrhea. Denies URI symptoms. Had family gathering at her home on Christmas day. Unsure of any sick contacts.   Review of Systems  Constitutional:  Positive for malaise/fatigue.  Respiratory:  Negative for cough.   Gastrointestinal:  Positive for diarrhea and vomiting.  All other systems reviewed and are negative.      Objective:    BP 132/81 Comment: patient self reporting  Pulse (!) 106 Comment: patient self reporting  Temp 98.9 F (37.2 C) Comment: patient self reporting - and has taken medication prior to testing  LMP  (LMP Unknown) Comment: LMP 2017   Physical Exam Vitals and nursing note reviewed.  Constitutional:      Appearance: Normal appearance. She is normal weight.  HENT:     Head: Normocephalic and atraumatic.     Right Ear: External ear normal.     Left Ear: External ear normal.  Eyes:     Extraocular Movements: Extraocular movements intact.      Conjunctiva/sclera: Conjunctivae normal.  Cardiovascular:     Rate and Rhythm: Tachycardia present.  Pulmonary:     Effort: Pulmonary effort is normal.  Neurological:     General: No focal deficit present.     Mental Status: She is alert and oriented to person, place, and time. Mental status is at baseline.  Psychiatric:        Mood and Affect: Mood normal.        Behavior: Behavior normal.        Thought Content: Thought content normal.        Judgment: Judgment normal.   No results found for any visits on 07/02/22.      Assessment & Plan:   Problem List Items Addressed This Visit   None Visit Diagnoses     Viral gastroenteritis    -  Primary   Relevant Medications   ondansetron (ZOFRAN-ODT) 8 MG disintegrating tablet       Meds ordered this encounter  Medications   ondansetron (ZOFRAN-ODT) 8 MG disintegrating tablet    Sig: Take 1 tablet (8 mg total) by mouth every 8 (eight) hours as needed for nausea.    Dispense:  21 tablet    Refill:  0  Likely viral gastroenteritis. Avoid anti-diarrheal medicines as this will prolong the viral course. Drink plenty of fluids along with electrolyte replacement drinks I.e gatorade.  Zofran ODT sent to use  prn for nausea although pt says she hasn't vomited since last night. Advance diet as tolerated.  No follow-ups on file.  Leeanne Rio, MD

## 2022-08-06 ENCOUNTER — Encounter: Payer: Self-pay | Admitting: Nurse Practitioner

## 2022-09-05 ENCOUNTER — Ambulatory Visit: Payer: BC Managed Care – PPO | Admitting: Nurse Practitioner

## 2022-09-16 DIAGNOSIS — I10 Essential (primary) hypertension: Secondary | ICD-10-CM | POA: Diagnosis not present

## 2022-09-16 DIAGNOSIS — Z7982 Long term (current) use of aspirin: Secondary | ICD-10-CM | POA: Diagnosis not present

## 2022-09-16 DIAGNOSIS — Z888 Allergy status to other drugs, medicaments and biological substances status: Secondary | ICD-10-CM | POA: Diagnosis not present

## 2022-09-16 DIAGNOSIS — Z87891 Personal history of nicotine dependence: Secondary | ICD-10-CM | POA: Diagnosis not present

## 2022-09-16 DIAGNOSIS — F419 Anxiety disorder, unspecified: Secondary | ICD-10-CM | POA: Diagnosis not present

## 2022-09-16 DIAGNOSIS — Z79899 Other long term (current) drug therapy: Secondary | ICD-10-CM | POA: Diagnosis not present

## 2022-09-16 DIAGNOSIS — R519 Headache, unspecified: Secondary | ICD-10-CM | POA: Diagnosis not present

## 2022-09-22 ENCOUNTER — Ambulatory Visit: Payer: BC Managed Care – PPO | Admitting: Medical-Surgical

## 2022-10-07 ENCOUNTER — Encounter: Payer: BC Managed Care – PPO | Admitting: Medical-Surgical

## 2022-10-10 ENCOUNTER — Ambulatory Visit: Payer: BC Managed Care – PPO | Admitting: Physician Assistant

## 2022-10-20 ENCOUNTER — Encounter: Payer: Self-pay | Admitting: Medical-Surgical

## 2022-10-20 ENCOUNTER — Ambulatory Visit (INDEPENDENT_AMBULATORY_CARE_PROVIDER_SITE_OTHER): Payer: BC Managed Care – PPO | Admitting: Medical-Surgical

## 2022-10-20 VITALS — BP 154/84 | HR 62 | Temp 98.6°F | Ht 65.0 in | Wt 169.9 lb

## 2022-10-20 DIAGNOSIS — I10 Essential (primary) hypertension: Secondary | ICD-10-CM | POA: Diagnosis not present

## 2022-10-20 DIAGNOSIS — Z0000 Encounter for general adult medical examination without abnormal findings: Secondary | ICD-10-CM | POA: Diagnosis not present

## 2022-10-20 DIAGNOSIS — E782 Mixed hyperlipidemia: Secondary | ICD-10-CM

## 2022-10-20 DIAGNOSIS — R635 Abnormal weight gain: Secondary | ICD-10-CM

## 2022-10-20 MED ORDER — HYDROCHLOROTHIAZIDE 25 MG PO TABS
ORAL_TABLET | ORAL | 1 refills | Status: DC
Start: 2022-10-20 — End: 2023-03-27

## 2022-10-20 MED ORDER — ATENOLOL 50 MG PO TABS
ORAL_TABLET | ORAL | 1 refills | Status: DC
Start: 2022-10-20 — End: 2023-03-27

## 2022-10-20 MED ORDER — TOPIRAMATE 25 MG PO TABS: 25.0000 mg | ORAL_TABLET | Freq: Two times a day (BID) | ORAL | 1 refills | Status: DC

## 2022-10-20 NOTE — Progress Notes (Signed)
Complete physical exam  Patient: Emily Ponce   DOB: Nov 02, 1965   58 y.o. Female  MRN: [id number]  Subjective:    Chief Complaint  Patient presents with   Annual Exam    Emily Ponce is a 57 y.o. female who presents today for a complete physical exam. She reports consuming a general diet. Home exercise routine includes treadmill. She generally feels well. She reports sleeping fairly well. She does have additional problems to discuss today.   Most recent fall risk assessment:    09/05/2021    3:33 PM  Fall Risk   Falls in the past year? 1  Number falls in past yr: 0  Injury with Fall? 0  Risk for fall due to : No Fall Risks  Follow up Falls evaluation completed     Most recent depression screenings:    10/20/2022   11:03 AM 04/04/2022    4:00 PM  PHQ 2/9 Scores  PHQ - 2 Score 0 0    Vision:Within last year, Dental: No current dental problems and Receives regular dental care, and STD: The patient denies history of sexually transmitted disease.    Patient Care Team: Christen Butter, NP as PCP - General (Nurse Practitioner)   Outpatient Medications Prior to Visit  Medication Sig   allopurinol (ZYLOPRIM) 300 MG tablet Take 1 tablet (300 mg total) by mouth daily.   Ascorbic Acid (VITAMIN C ER PO) Take by mouth. (Patient not taking: Reported on 10/20/2022)   BIOTIN 5000 PO Take 1 tablet by mouth daily.   calcium-vitamin D (OSCAL WITH D) 500-200 MG-UNIT tablet Take 1 tablet by mouth.   colchicine 0.6 MG tablet Take 1 tablet (0.6 mg total) by mouth 2 (two) times daily.   Flaxseed, Linseed, (FLAX SEEDS) POWD Take 1,000 mg by mouth daily.    folic acid (FOLVITE) 1 MG tablet Take 1 mg by mouth daily.   Glucosamine HCl (GLUCOSAMINE PO) Take 1 tablet by mouth daily.   milk thistle 175 MG tablet Take 175 mg by mouth daily.   Omega-3 Fatty Acids (FISH OIL PO) Take 1 capsule by mouth daily.   omeprazole (PRILOSEC) 40 MG capsule TAKE 1 CAPSULE(40 MG) BY MOUTH DAILY    ondansetron (ZOFRAN-ODT) 8 MG disintegrating tablet Take 1 tablet (8 mg total) by mouth every 8 (eight) hours as needed for nausea. (Patient not taking: Reported on 10/20/2022)   pyridOXINE (VITAMIN B-6) 25 MG tablet Take by mouth. (Patient not taking: Reported on 07/02/2022)   SELENIUM PO Take by mouth.   ZINC CITRATE PO Take by mouth.   [DISCONTINUED] atenolol (TENORMIN) 50 MG tablet TAKE 1 TABLET(50 MG) BY MOUTH DAILY   [DISCONTINUED] hydrochlorothiazide (HYDRODIURIL) 25 MG tablet TAKE 1 TABLET(25 MG) BY MOUTH DAILY   No facility-administered medications prior to visit.   Review of Systems  Constitutional:  Negative for chills, fever, malaise/fatigue and weight loss.  HENT:  Negative for congestion, ear pain, hearing loss, sinus pain and sore throat.   Eyes:  Negative for blurred vision, photophobia and pain.  Respiratory:  Negative for cough, shortness of breath and wheezing.   Cardiovascular:  Negative for chest pain, palpitations and leg swelling.  Gastrointestinal:  Negative for abdominal pain, constipation, diarrhea, heartburn, nausea and vomiting.  Genitourinary:  Negative for dysuria, frequency and urgency.  Musculoskeletal:  Negative for falls and neck pain.  Skin:  Negative for itching and rash.  Neurological:  Negative for dizziness, weakness and headaches.  Endo/Heme/Allergies:  Negative for polydipsia.  Does not bruise/bleed easily.  Psychiatric/Behavioral:  Negative for depression, substance abuse and suicidal ideas. The patient is not nervous/anxious.      Objective:    BP (!) 154/84   Pulse 62   Temp 98.6 F (37 C) (Oral)   Ht 5\' 5"  (1.651 m)   Wt 169 lb 14.4 oz (77.1 kg)   LMP  (LMP Unknown) Comment: LMP 2017  SpO2 99%   BMI 28.27 kg/m    Physical Exam Constitutional:      General: She is not in acute distress.    Appearance: Normal appearance. She is not ill-appearing.  HENT:     Head: Normocephalic and atraumatic.     Right Ear: Tympanic membrane, ear  canal and external ear normal. There is no impacted cerumen.     Left Ear: Tympanic membrane, ear canal and external ear normal. There is no impacted cerumen.     Nose: Nose normal. No congestion or rhinorrhea.     Mouth/Throat:     Mouth: Mucous membranes are moist.     Pharynx: No oropharyngeal exudate or posterior oropharyngeal erythema.  Eyes:     General: No scleral icterus.       Right eye: No discharge.        Left eye: No discharge.     Extraocular Movements: Extraocular movements intact.     Conjunctiva/sclera: Conjunctivae normal.     Pupils: Pupils are equal, round, and reactive to light.  Neck:     Thyroid: No thyromegaly.     Vascular: No carotid bruit or JVD.     Trachea: Trachea normal.  Cardiovascular:     Rate and Rhythm: Normal rate and regular rhythm.     Pulses: Normal pulses.     Heart sounds: Normal heart sounds. No murmur heard.    No friction rub. No gallop.  Pulmonary:     Effort: Pulmonary effort is normal. No respiratory distress.     Breath sounds: Normal breath sounds. No wheezing.  Abdominal:     General: Bowel sounds are normal. There is no distension.     Palpations: Abdomen is soft.     Tenderness: There is no abdominal tenderness. There is no guarding.  Musculoskeletal:        General: Normal range of motion.     Cervical back: Normal range of motion and neck supple.  Lymphadenopathy:     Cervical: No cervical adenopathy.  Skin:    General: Skin is warm and dry.  Neurological:     Mental Status: She is alert and oriented to person, place, and time.     Cranial Nerves: No cranial nerve deficit.  Psychiatric:        Mood and Affect: Mood normal.        Behavior: Behavior normal.        Thought Content: Thought content normal.        Judgment: Judgment normal.      No results found for any visits on 10/20/22.     Assessment & Plan:    Routine Health Maintenance and Physical Exam  Immunization History  Administered Date(s)  Administered   Tdap 02/08/2009, 07/25/2014   Zoster Recombinat (Shingrix) 07/27/2019, 10/25/2019    Health Maintenance  Topic Date Due   COVID-19 Vaccine (1) Never done   Lung Cancer Screening  10/20/2023 (Originally 11/06/2013)   INFLUENZA VACCINE  02/05/2023   MAMMOGRAM  05/15/2024   COLONOSCOPY (Pts 45-53yrs Insurance coverage will need to be confirmed)  06/22/2024   DTaP/Tdap/Td (3 - Td or Tdap) 07/25/2024   PAP SMEAR-Modifier  02/12/2026   Hepatitis C Screening  Completed   Zoster Vaccines- Shingrix  Completed   HPV VACCINES  Aged Out    Discussed health benefits of physical activity, and encouraged her to engage in regular exercise appropriate for her age and condition.  1. Annual physical exam Checking labs as below. UTD on preventative care. Wellness information provided with AVS. - Lipid panel - COMPLETE METABOLIC PANEL WITH GFR - CBC with Differential/Platelet  2. Essential hypertension BP elevated on arrival. Recently had elevated readings prompting a visit to the ED. Admits some dietary/lifestyle indiscretions. Checking labs. Continue Atenolol and HCTZ. Recommend continuing exercise but work on lower sodium food choices and avoid alcohol intake. Plan to work on weight loss as below.  - Lipid panel - COMPLETE METABOLIC PANEL WITH GFR - CBC with Differential/Platelet - atenolol (TENORMIN) 50 MG tablet; TAKE 1 TABLET(50 MG) BY MOUTH DAILY  Dispense: 90 tablet; Refill: 1 - hydrochlorothiazide (HYDRODIURIL) 25 MG tablet; TAKE 1 TABLET(25 MG) BY MOUTH DAILY  Dispense: 90 tablet; Refill: 1  3. Mixed hyperlipidemia Checking labs.  - Lipid panel - COMPLETE METABOLIC PANEL WITH GFR  4. Weight gain Checking TSH. Reviewed options for medications to help with weight loss. Avoiding phentermine with BP elevations. Starting Topamax  BID. Discussed dietary modifications and recommendations for weighing/logging portions.  - TSH  Return in about 6 weeks (around 12/01/2022) for  weight check.   Christen Butter, NP

## 2022-10-21 LAB — COMPLETE METABOLIC PANEL WITH GFR
AG Ratio: 1.8 (calc) (ref 1.0–2.5)
ALT: 256 U/L — ABNORMAL HIGH (ref 6–29)
AST: 142 U/L — ABNORMAL HIGH (ref 10–35)
Albumin: 4.9 g/dL (ref 3.6–5.1)
Alkaline phosphatase (APISO): 67 U/L (ref 37–153)
BUN: 16 mg/dL (ref 7–25)
CO2: 28 mmol/L (ref 20–32)
Calcium: 9.6 mg/dL (ref 8.6–10.4)
Chloride: 97 mmol/L — ABNORMAL LOW (ref 98–110)
Creat: 0.79 mg/dL (ref 0.50–1.03)
Globulin: 2.8 g/dL (calc) (ref 1.9–3.7)
Glucose, Bld: 88 mg/dL (ref 65–99)
Potassium: 3.6 mmol/L (ref 3.5–5.3)
Sodium: 139 mmol/L (ref 135–146)
Total Bilirubin: 0.7 mg/dL (ref 0.2–1.2)
Total Protein: 7.7 g/dL (ref 6.1–8.1)
eGFR: 87 mL/min/{1.73_m2} (ref >=60)

## 2022-10-21 LAB — CBC WITH DIFFERENTIAL/PLATELET
Absolute Monocytes: 585 cells/uL (ref 200–950)
Basophils Absolute: 76 cells/uL (ref 0–200)
Basophils Relative: 1 %
Eosinophils Absolute: 122 cells/uL (ref 15–500)
Eosinophils Relative: 1.6 %
HCT: 44.5 % (ref 35.0–45.0)
Hemoglobin: 15.5 g/dL (ref 11.7–15.5)
Lymphs Abs: 2265 cells/uL (ref 850–3900)
MCH: 32.5 pg (ref 27.0–33.0)
MCHC: 34.8 g/dL (ref 32.0–36.0)
MCV: 93.3 fL (ref 80.0–100.0)
MPV: 11.9 fL (ref 7.5–12.5)
Monocytes Relative: 7.7 %
Neutro Abs: 4552 cells/uL (ref 1500–7800)
Neutrophils Relative %: 59.9 %
Platelets: 207 10*3/uL (ref 140–400)
RBC: 4.77 10*6/uL (ref 3.80–5.10)
RDW: 12.3 % (ref 11.0–15.0)
Total Lymphocyte: 29.8 %
WBC: 7.6 10*3/uL (ref 3.8–10.8)

## 2022-10-21 LAB — LIPID PANEL
Cholesterol: 258 mg/dL — ABNORMAL HIGH (ref <200)
HDL: 70 mg/dL (ref >=50)
LDL Cholesterol (Calc): 157 mg/dL (calc) — ABNORMAL HIGH
Non-HDL Cholesterol (Calc): 188 mg/dL (calc) — ABNORMAL HIGH (ref <130)
Total CHOL/HDL Ratio: 3.7 (calc) (ref <5.0)
Triglycerides: 172 mg/dL — ABNORMAL HIGH (ref <150)

## 2022-10-21 LAB — TSH: TSH: 2.18 mIU/L (ref 0.40–4.50)

## 2022-10-23 NOTE — Progress Notes (Signed)
10/23/2022 Emily Ponce [id number] [birth year]   CHIEF COMPLAINT: Elevated LFTs  HISTORY OF PRESENT ILLNESS:Emily Ponce is a 57 year old female with a past medical history of anxiety, hypertension, GERD and elevated LFTs. She presents to our office today as referred by Christen Butter NP for further evaluation regarding elevated LFTs.   She was initially seen by Dr. Chales Abrahams in office 11/08/2018 due to elevated LFTs and RUQ imaging which showed evidence of hepatic steatosis. At that time her AST level was 84 and ALT level was 130.  Hepatitis A, B and C serologies were negative.  She did not have immunity to hepatitis A or hepatitis B therefore vaccinations were recommended.  She has not been seen in our office since then.  Her most recent laboratory studies 10/20/2022 showed an AST level of 142 and ALT 256.  Normal total bili and alk phos levels.  She denies receiving the hepatitis A or B vaccination as previously recommended.  No nausea or vomiting.  No issues with confusion.  No itchiness or jaundice.  No abdominal pain.  She is passing normal brown bowel movement most days.  No rectal bleeding or black stools.  She [facility] which [facility].  No significant weight gain or weight loss over the past year.  Prior to the COVID pandemic, she drank 2-3 beers every other day and switched to drinking 2-4 shots of tequila on ice 2 nights weekly/on the weekends.  No drug use.  Infrequent NSAID use.  No herbal supplements.  No active GERD symptoms on Omeprazole 40 mg daily.  She recently started exercising on a home treadmill.  No known family history of liver disease.      Latest Ref Rng & Units 10/20/2022   11:37 AM 04/04/2022    4:22 PM 09/18/2021   12:00 AM  CMP  Glucose 65 - 99 mg/dL 88   80   BUN 7 - 25 mg/dL 16   18   Creatinine 6.57 - 1.03 mg/dL 8.46   9.62   Sodium 952 - 146 mmol/L 139   141   Potassium 3.5 - 5.3 mmol/L 3.6   3.9    Chloride 98 - 110 mmol/L 97   100   CO2 20 - 32 mmol/L 28   30   Calcium 8.6 - 10.4 mg/dL 9.6   84.1   Total Protein 6.1 - 8.1 g/dL 7.7  6.9  7.3   Total Bilirubin 0.2 - 1.2 mg/dL 0.7  0.8  0.5   AST 10 - 35 U/L 142  178  110   ALT 6 - 29 U/L 256  328  175   Alk phos 76.     Latest Ref Rng & Units 10/20/2022   11:37 AM 07/27/2019    8:59 AM 08/31/2018    1:53 PM  CBC  WBC 3.8 - 10.8 Thousand/uL 7.6  7.2  9.4   Hemoglobin 11.7 - 15.5 g/dL 32.4  40.1  02.7   Hematocrit 35.0 - 45.0 % 44.5  42.4  43.0   Platelets 140 - 400 Thousand/uL 207  234  271      Labs 11/19/2018: Hepatitis A nonreactive.  Hepatitis B surface antigen nonreactive.  Hepatitis B surface antibody nonreactive.  Otitis C antibody nonreactive.  RUQ sonogram 09/20/2018: FINDINGS: Gallbladder: No gallstones or wall thickening visualized. No sonographic Murphy sign noted by sonographer.   Common bile duct: Diameter: 3 mm   Liver:  Heterogeneously increased echogenicity of the liver parenchyma. No nodular contour. Centralized region of the liver on transverse images demonstrate shadowing with relative increased echogenicity. Portal vein is patent on color Doppler imaging with normal direction of blood flow towards the liver.   Additional: Hyperechoic focus at the interpolar region of the right kidney with shadowing and ring down, likely nonobstructive stone. No hydronephrosis.   IMPRESSION: Heterogeneously increased echogenicity of the liver suggesting steatosis.   There are central regions of the liver which demonstrate posterior acoustic shadowing of uncertain significance. This may represent gas within the biliary system, or potentially calcifications. Correlation with plain film imaging may be useful as well as a history of biliary intervention. If there is concern for acute abdominal pathology, CT imaging may be considered.  PAST GI PROCEDURES:  [facility]: Normal colonoscopy  Recall colonoscopy 10 years    Past Medical History:  Diagnosis Date   Anxiety    Broken neck    c-7   GERD (gastroesophageal reflux disease)    Hypertension    Ovarian cyst    Past Surgical History:  Procedure Laterality Date   CARPAL TUNNEL RELEASE Left    COLONOSCOPY     about 5 years ago around 2015. Possibly Fran Lowes   ESOPHAGOGASTRODUODENOSCOPY     about 7 years ago per patient. Around 2013   esure      TUBAL LIGATION     Social History: She is married.  She smoked cigarettes 1/2 ppd x 20 years, quite 2011 or 2012. She drank 2 to 3 beers every other day prior to the Covid pandemic then switched to drinking  2 to 4 shots of tequila on ice on the weekends.  No drug use.  Family History:  Maternal uncle died 12's kidney cancer. Sister had cervical cancer.  Father with history of hypertension and Alzheimer's.  Mother with history of hypertension and CAD/MI.  No family history of liver disease or colorectal cancer.   Allergies  Allergen Reactions   Chantix [Varenicline]     Dreams      Outpatient Encounter Medications as of 10/24/2022  Medication Sig   allopurinol (ZYLOPRIM) 300 MG tablet Take 1 tablet (300 mg total) by mouth daily.   Ascorbic Acid (VITAMIN C ER PO) Take by mouth. (Patient not taking: Reported on 10/20/2022)   atenolol (TENORMIN) 50 MG tablet TAKE 1 TABLET(50 MG) BY MOUTH DAILY   BIOTIN 5000 PO Take 1 tablet by mouth daily.   calcium-vitamin D (OSCAL WITH D) 500-200 MG-UNIT tablet Take 1 tablet by mouth.   colchicine 0.6 MG tablet Take 1 tablet (0.6 mg total) by mouth 2 (two) times daily.   Flaxseed, Linseed, (FLAX SEEDS) POWD Take 1,000 mg by mouth daily.    folic acid (FOLVITE) 1 MG tablet Take 1 mg by mouth daily.   Glucosamine HCl (GLUCOSAMINE PO) Take 1 tablet by mouth daily.   hydrochlorothiazide (HYDRODIURIL) 25 MG tablet TAKE 1 TABLET(25 MG) BY MOUTH DAILY   milk thistle 175 MG tablet Take 175 mg by  mouth daily.   Omega-3 Fatty Acids (FISH OIL PO) Take 1 capsule by mouth daily.   omeprazole (PRILOSEC) 40 MG capsule TAKE 1 CAPSULE(40 MG) BY MOUTH DAILY   ondansetron (ZOFRAN-ODT) 8 MG disintegrating tablet Take 1 tablet (8 mg total) by mouth every 8 (eight) hours as needed for nausea. (Patient not taking: Reported on 10/20/2022)   pyridOXINE (VITAMIN B-6) 25 MG tablet Take by mouth. (Patient not  taking: Reported on 07/02/2022)   SELENIUM PO Take by mouth.   topiramate (TOPAMAX) 25 MG tablet Take 1 tablet (25 mg total) by mouth 2 (two) times daily.   ZINC CITRATE PO Take by mouth.   No facility-administered encounter medications on file as of 10/24/2022.    REVIEW OF SYSTEMS:  Gen: Denies fever, sweats or chills. No weight loss.  CV: Denies chest pain, palpitations or edema. Resp: Denies cough, shortness of breath of hemoptysis.  GI: See HPI. GU : Denies urinary burning, blood in urine, increased urinary frequency or incontinence. MS: Denies joint pain, muscles aches or weakness. Derm: Denies rash, itchiness, skin lesions or unhealing ulcers. Psych: Denies depression, anxiety, memory loss or confusion. Heme: Denies bruising, easy bleeding. Neuro:  Denies headaches, dizziness or paresthesias. Endo:  Denies any problems with DM, thyroid or adrenal function.  PHYSICAL EXAM: BP 124/80   Pulse 61   Ht 5\' 5"  (1.651 m)   Wt 169 lb (76.7 kg)   LMP  (LMP Unknown) Comment: LMP 2017  BMI 28.12 kg/m   LMP  (LMP Unknown) Comment: LMP 2017 General: 57 year old female in no acute distress. Head: Normocephalic and atraumatic. Eyes:  Sclerae non-icteric, conjunctive pink. Ears: Normal auditory acuity. Mouth: Dentition intact. No ulcers or lesions.  Neck: Supple, no lymphadenopathy or thyromegaly.  Lungs: Clear bilaterally to auscultation without wheezes, crackles or rhonchi. Heart: Regular rate and rhythm. No murmur, rub or gallop appreciated.  Abdomen: Soft, nontender, nondistended. No  masses. No hepatosplenomegaly. Normoactive bowel sounds x 4 quadrants.  Rectal: Deferred.  Musculoskeletal: Symmetrical with no gross deformities. Skin: Warm and dry. No rash or lesions on visible extremities. Extremities: No edema. Neurological: Alert oriented x 4, no focal deficits.  Psychological:  Alert and cooperative. Normal mood and affect.  ASSESSMENT AND PLAN:  57 year old female with chronically elevated AST/ALT levels with normal T. Bili and Alk phos levels. RUQ sono 09/2018 showed evidence of hepatic steatosis central regions of the liver which demonstrated posterior acoustic shadowing of uncertain significance.  Negative hepatitis A, B and C serologies. 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  Alcohol intake may be a contributing factor. -Hepatitis A and B vaccinations recommended, patient prefers to schedule vaccination appoint with PCP -ANA, SMA, AMA, IgG, ceruloplasmin, Hep B core total antibody, PT/INR, alpha-1 antitrypsin, iron, ferritin, TTG, IgA -Liver MRI with and without contrast  -Recommended no alcohol -Reduce carbohydrates in diet i.e. bread/pasta/potato/rice/sweets, exercise as tolerated and lose weight to reduce the risk of developing progressive steatotic liver disease -Further recommendations to be determined after the above valuation completed  Colon cancer screening.  Normal colonoscopy 06/22/2014. Next colonoscopy due 06/2024  GERD, stable on Omeprazole 40 mg daily.    CC:  Christen Butter, NP

## 2022-10-24 ENCOUNTER — Other Ambulatory Visit (INDEPENDENT_AMBULATORY_CARE_PROVIDER_SITE_OTHER): Payer: BC Managed Care – PPO

## 2022-10-24 ENCOUNTER — Ambulatory Visit: Payer: BC Managed Care – PPO | Admitting: Nurse Practitioner

## 2022-10-24 ENCOUNTER — Encounter: Payer: Self-pay | Admitting: Nurse Practitioner

## 2022-10-24 VITALS — BP 124/80 | HR 61 | Ht 65.0 in | Wt 169.0 lb

## 2022-10-24 DIAGNOSIS — R7989 Other specified abnormal findings of blood chemistry: Secondary | ICD-10-CM | POA: Diagnosis not present

## 2022-10-24 DIAGNOSIS — K760 Fatty (change of) liver, not elsewhere classified: Secondary | ICD-10-CM

## 2022-10-24 LAB — FERRITIN: Ferritin: 262.1 ng/mL (ref 10.0–291.0)

## 2022-10-24 LAB — PROTIME-INR
INR: 1.2 ratio — ABNORMAL HIGH (ref 0.8–1.0)
Prothrombin Time: 12.2 s (ref 9.6–13.1)

## 2022-10-24 NOTE — Patient Instructions (Signed)
Schedule Hep A & Hep B with your primary care doctor.  Reduce carbohydrates in diet.  Your provider has requested that you go to the basement level for lab work before leaving today. Press "B" on the elevator. The lab is located at the first door on the left as you exit the elevator.  Recommend no alcohol.  You have been scheduled for an MRI at [facility] on 10/31/22. Your appointment time is 5:00 pm. Please arrive to admitting (at main entrance of the hospital) 30 minutes prior to your appointment time for registration purposes. Please make certain not to have anything to eat or drink 4 hours prior to your test. In addition, if you have any metal in your body, have a pacemaker or defibrillator, please be sure to let your ordering physician know. This test typically takes 45 minutes to 1 hour to complete. Should you need to reschedule, please call [phone] to do so.  Due to recent changes in healthcare laws, you may see the results of your imaging and laboratory studies on MyChart before your provider has had a chance to review them.  We understand that in some cases there may be results that are confusing or concerning to you. Not all laboratory results come back in the same time frame and the provider may be waiting for multiple results in order to interpret others.  Please give [place] 48 hours in order for your provider to thoroughly review all the results before contacting the office for clarification of your results.    Thank you for trusting me with your gastrointestinal care!   Alcide Evener, CRNP

## 2022-10-25 LAB — IRON: Iron: 97 ug/dL (ref 42–145)

## 2022-10-25 LAB — TISSUE TRANSGLUTAMINASE ABS,IGG,IGA: (tTG) Ab, IgG: <1 U/mL

## 2022-10-26 LAB — ANA: Anti Nuclear Antibody (ANA): POSITIVE — AB

## 2022-10-26 LAB — HEPATITIS B CORE ANTIBODY, TOTAL: Hep B Core Total Ab: REACTIVE — AB

## 2022-10-27 ENCOUNTER — Telehealth: Payer: Self-pay | Admitting: Nurse Practitioner

## 2022-10-27 LAB — ANTI-NUCLEAR AB-TITER (ANA TITER): ANA Titer 1: 1:40 {titer} — ABNORMAL HIGH

## 2022-10-27 LAB — TISSUE TRANSGLUTAMINASE ABS,IGG,IGA: (tTG) Ab, IgA: <1 U/mL

## 2022-10-27 LAB — CERULOPLASMIN: Ceruloplasmin: 29 mg/dL (ref 18–53)

## 2022-10-27 LAB — ALPHA-1-ANTITRYPSIN: A-1 Antitrypsin, Ser: 130 mg/dL (ref 83–199)

## 2022-10-27 NOTE — Telephone Encounter (Signed)
[employer] is calling to have MRI order faxed to [facility]  [phone]

## 2022-10-28 NOTE — Telephone Encounter (Signed)
Pt was contacted in regard to previous message stating that an order needs to be faxed to [facility] for her to have the MRI done. Chart reviewed and noted that pt is scheduled to have MRI on 11/18/2022 at 5:20 PM. Pt  was aware but thought that an order needed to be faxed. Pt was notified that the order is in and no order needs to be faxed. Pt verbalized understanding with all questions answered.

## 2022-10-29 LAB — ANTI-NUCLEAR AB-TITER (ANA TITER): ANA TITER: 1:40 {titer} — ABNORMAL HIGH

## 2022-10-29 LAB — IGG: IgG (Immunoglobin G), Serum: 866 mg/dL (ref 600–1640)

## 2022-10-29 LAB — IGA: Immunoglobulin A: 232 mg/dL (ref 47–310)

## 2022-10-29 LAB — ANTI-SMOOTH MUSCLE ANTIBODY, IGG: Actin (Smooth Muscle) Antibody (IGG): <20 U (ref <20)

## 2022-10-29 LAB — MITOCHONDRIAL ANTIBODIES: Mitochondrial M2 Ab, IgG: <=20 U (ref <=20.0)

## 2022-10-29 NOTE — Progress Notes (Signed)
Liver MRI would be much better  agree with assessment/plan.  Edman Circle, MD [facility] [phone]

## 2022-10-31 ENCOUNTER — Ambulatory Visit (HOSPITAL_COMMUNITY): Payer: BC Managed Care – PPO

## 2022-11-10 ENCOUNTER — Telehealth: Payer: Self-pay

## 2022-11-10 NOTE — Telephone Encounter (Signed)
Contact pt & pt verbalized understanding of lab results & has already seen the results via MyChart.

## 2022-11-18 ENCOUNTER — Ambulatory Visit
Admission: RE | Admit: 2022-11-18 | Discharge: 2022-11-18 | Disposition: A | Discharge status: Home / Self Care | Payer: BC Managed Care – PPO | Source: Ambulatory Visit | Attending: Nurse Practitioner | Admitting: Nurse Practitioner

## 2022-11-18 DIAGNOSIS — K760 Fatty (change of) liver, not elsewhere classified: Secondary | ICD-10-CM

## 2022-11-18 DIAGNOSIS — R7989 Other specified abnormal findings of blood chemistry: Secondary | ICD-10-CM

## 2022-11-18 DIAGNOSIS — R7401 Elevation of levels of liver transaminase levels: Secondary | ICD-10-CM | POA: Diagnosis not present

## 2022-11-18 MED ORDER — GADOPICLENOL 0.5 MMOL/ML IV SOLN
7.5000 mL | Freq: Once | INTRAVENOUS | Status: AC | PRN
  Administered 2022-11-18: 7.5 mL via INTRAVENOUS

## 2022-11-24 ENCOUNTER — Other Ambulatory Visit: Payer: Self-pay

## 2022-11-24 DIAGNOSIS — R7989 Other specified abnormal findings of blood chemistry: Secondary | ICD-10-CM

## 2022-11-24 DIAGNOSIS — K760 Fatty (change of) liver, not elsewhere classified: Secondary | ICD-10-CM

## 2022-11-25 ENCOUNTER — Telehealth: Payer: Self-pay | Admitting: Medical-Surgical

## 2022-11-25 ENCOUNTER — Ambulatory Visit: Payer: BC Managed Care – PPO | Admitting: Medical-Surgical

## 2022-11-25 ENCOUNTER — Encounter: Payer: Self-pay | Admitting: Medical-Surgical

## 2022-11-25 VITALS — BP 134/82 | HR 65 | Temp 97.9°F | Ht 65.0 in | Wt 168.9 lb

## 2022-11-25 DIAGNOSIS — E782 Mixed hyperlipidemia: Secondary | ICD-10-CM

## 2022-11-25 DIAGNOSIS — R635 Abnormal weight gain: Secondary | ICD-10-CM

## 2022-11-25 DIAGNOSIS — E663 Overweight: Secondary | ICD-10-CM

## 2022-11-25 DIAGNOSIS — I10 Essential (primary) hypertension: Secondary | ICD-10-CM

## 2022-11-25 DIAGNOSIS — K219 Gastro-esophageal reflux disease without esophagitis: Secondary | ICD-10-CM | POA: Diagnosis not present

## 2022-11-25 MED ORDER — WEGOVY 0.25 MG/0.5ML ~~LOC~~ SOAJ
0.2500 mg | SUBCUTANEOUS | 0 refills | Status: DC
Start: 2022-11-25 — End: 2023-03-13

## 2022-11-25 MED ORDER — PHENTERMINE HCL 37.5 MG PO TABS: ORAL_TABLET | ORAL | 0 refills | Status: DC

## 2022-11-25 MED ORDER — OMEPRAZOLE 40 MG PO CPDR
DELAYED_RELEASE_CAPSULE | ORAL | 1 refills | Status: DC
Start: 2022-11-25 — End: 2023-03-27

## 2022-11-25 NOTE — Telephone Encounter (Signed)
Patient called stated her insurance covers weight loss medication Wagovy or Ozempic please advise

## 2022-11-25 NOTE — Patient Instructions (Addendum)
Wegovy Zepbound Ozempic [employer]

## 2022-11-25 NOTE — Progress Notes (Signed)
        Established patient visit  History, exam, impression, and plan:  1. Weight gain Pleasant 57 year old female presenting today for follow-up on weight gain.  Approximately 6 weeks ago, she was started on Topamax however stopped this 1 day after starting it due to numbness and tingling in the extremities.  Has not been on any medications for weight loss since then.  Has been practicing portion control and logging her foods but admits that she has been eating out a lot and needs to make some significant dietary/lifestyle changes.  Interested in trialing a different medication for weight loss.  After discussing options, would like to try phentermine since she has done well on this before.  Has a blood pressure cuff and plans to monitor her blood pressure at home.  Currently doing well on her atenolol and hydrochlorothiazide.  Discussed the nature of phentermine and the importance of short-term treatment for safety.  Starting phentermine 37.5 mg daily.  If well-tolerated and no significant blood pressure elevations, plan to continue this for 3 months.  In the meantime, she will check with her insurance to see if there are any injectables for weight loss covered.  2. Gastroesophageal reflux disease, unspecified whether esophagitis present Has been taking omeprazole 40 g daily as needed, tolerating well without side effects.  Had difficulty at the pharmacy getting the refill that was sent on this and so resending today. - omeprazole (PRILOSEC) 40 MG capsule; TAKE 1 CAPSULE(40 MG) BY MOUTH DAILY  Dispense: 90 capsule; Refill: 1  Procedures performed this visit: None.  Return in about 4 weeks (around 12/23/2022) for weight check.  __________________________________ Thayer Ohm, DNP, APRN, FNP-BC Primary Care and Sports Medicine [facility] [place]

## 2022-11-25 NOTE — Telephone Encounter (Signed)
Sending over Wegovy 0.25 mg weekly to see if we can process this through prior authorization and get it approved.  If approved, we will complete 4 weeks of this before going to the next dose up. ___________________________________________ Thayer Ohm, DNP, APRN, FNP-BC Primary Care and Sports Medicine [facility] [place]

## 2022-11-27 ENCOUNTER — Telehealth: Payer: Self-pay | Admitting: Medical-Surgical

## 2022-11-27 NOTE — Telephone Encounter (Signed)
Pt called. She is following up on PA for 2 weight loss meds, ZOXWRU and other med for weight loww(can't remember name of med). Pharmacy: [facility] [place] FYI: I let patients know that you have been working with other Providers reason being for delay in PA's.

## 2022-12-02 ENCOUNTER — Telehealth: Payer: Self-pay | Admitting: Medical-Surgical

## 2022-12-02 NOTE — Telephone Encounter (Signed)
Patient requests update/needs prior authorization for ;  [facility] 0.25/0,84ml patient is following up on status please advise if approved please submit [facility] on [address] [place] [phone]

## 2022-12-04 NOTE — Telephone Encounter (Signed)
Pt called. I explained to her that message has been sent.

## 2022-12-05 ENCOUNTER — Telehealth: Payer: Self-pay

## 2022-12-05 NOTE — Telephone Encounter (Signed)
Initiated Prior authorization ZOX:WRUEAV 0.25MG /0.5ML auto-injectors Via: Covermymeds Case/Key:[id number] Status: approved  as of 12/05/22 Reason:Coverage Start Date:11/05/2022;Coverage End Date:07/03/2023;. Authorization Expiration Date: July 03, 2023. Notified Pt via: Mychart

## 2022-12-23 ENCOUNTER — Ambulatory Visit: Payer: BC Managed Care – PPO | Admitting: Medical-Surgical

## 2022-12-23 VITALS — BP 128/72 | HR 64 | Resp 20 | Ht 65.0 in | Wt 166.3 lb

## 2022-12-23 DIAGNOSIS — R635 Abnormal weight gain: Secondary | ICD-10-CM | POA: Diagnosis not present

## 2022-12-23 DIAGNOSIS — M545 Low back pain, unspecified: Secondary | ICD-10-CM | POA: Diagnosis not present

## 2022-12-23 MED ORDER — HYDROCODONE-ACETAMINOPHEN 5-325 MG PO TABS: 1.0000 | ORAL_TABLET | Freq: Three times a day (TID) | ORAL | 0 refills | Status: DC | PRN

## 2022-12-23 MED ORDER — KETOROLAC TROMETHAMINE 60 MG/2ML IM SOLN
60.0000 mg | Freq: Once | INTRAMUSCULAR | Status: AC
Start: 2022-12-23 — End: 2022-12-23
  Administered 2022-12-23: 60 mg via INTRAMUSCULAR

## 2022-12-23 MED ORDER — METHYLPREDNISOLONE ACETATE 80 MG/ML IJ SUSP
80.0000 mg | Freq: Once | INTRAMUSCULAR | Status: AC
Start: 2022-12-23 — End: 2022-12-23
  Administered 2022-12-23: 80 mg via INTRAMUSCULAR

## 2022-12-23 MED ORDER — WEGOVY 0.5 MG/0.5ML ~~LOC~~ SOAJ: 0.5000 mg | SUBCUTANEOUS | 0 refills | Status: DC

## 2022-12-23 NOTE — Progress Notes (Signed)
        Established patient visit  History, exam, impression, and plan:  1. Weight gain Pleasant 57 year old female presenting today for follow-up on weight gain.  Approximately 4 weeks ago, we started phentermine 37.5 mg daily which she has been taking as prescribed, tolerating well without side effects.  We also initiated a prior authorization request for Wegovy to see if she would qualify.  She was able to get the [facility] and took her second injection of 0.25 mg weekly today.  Tolerating well without side effects.  She stopped the phentermine when she started the [facility].  Notes a little reduction in her overall appetite and has lost a couple of pounds since her last visit here.  Working on staying physically active and making healthier dietary modifications.  Since she is doing well so far, discontinue phentermine.  Continue Wegovy 0.25 mg for the full 4 weeks then increase to 0.5 mg weekly for 4 weeks.  Advised to reach out to me should she have any issues between now and then but if she is ready to move up to the next dose when nearing the end of 0.5 mg weekly dosing, she should let me know so I can send in the appropriate prescriptions.  Plan to follow-up in person in 3 months.  2. Acute midline low back pain without sciatica Has been cleaning out her back porch and doing some heavy lifting as well as scrubbing walls and such.  Yesterday evening noted that her midline low back was very painful.  This is made it hard to sit, stand, walk, etc.  Has taken Tylenol and Advil PM with no relief.  Had baclofen from an old prescription at home which was not helpful.  Intolerant to tramadol and would like a prescription for something to help with the acute pain over the next couple of days.  No red flag symptoms today.  Does not do well with oral steroids due to weight gain so giving Depo-Medrol 80 mg IM x 1 in office.  Also treating with Toradol 60 mg IM x 1.  Sending a short quantity of hydrocodone for sparing  as needed use.  If symptoms do not improve over the next several days, we may need to do imaging as well as referral for physical therapy but we will hold off for now and she will let me know. - methylPREDNISolone acetate (DEPO-MEDROL) injection 80 mg - ketorolac (TORADOL) injection 60 mg  Procedures performed this visit: None.  Return in about 3 months (around 03/25/2023) for weight check.  __________________________________ Thayer Ohm, DNP, APRN, FNP-BC Primary Care and Sports Medicine [facility] [place]

## 2023-01-21 DIAGNOSIS — N764 Abscess of vulva: Secondary | ICD-10-CM | POA: Diagnosis not present

## 2023-01-21 DIAGNOSIS — L732 Hidradenitis suppurativa: Secondary | ICD-10-CM | POA: Diagnosis not present

## 2023-02-02 ENCOUNTER — Ambulatory Visit: Payer: BC Managed Care – PPO | Admitting: Nurse Practitioner

## 2023-02-09 ENCOUNTER — Other Ambulatory Visit: Payer: Self-pay | Admitting: Medical-Surgical

## 2023-03-01 ENCOUNTER — Other Ambulatory Visit: Payer: Self-pay | Admitting: Medical-Surgical

## 2023-03-03 ENCOUNTER — Ambulatory Visit: Payer: BC Managed Care – PPO | Admitting: Nurse Practitioner

## 2023-03-07 ENCOUNTER — Other Ambulatory Visit: Payer: Self-pay | Admitting: Medical-Surgical

## 2023-03-07 DIAGNOSIS — I10 Essential (primary) hypertension: Secondary | ICD-10-CM

## 2023-03-10 ENCOUNTER — Encounter: Payer: Self-pay | Admitting: Medical-Surgical

## 2023-03-13 ENCOUNTER — Telehealth: Payer: Self-pay | Admitting: Medical-Surgical

## 2023-03-13 ENCOUNTER — Other Ambulatory Visit: Payer: Self-pay | Admitting: Medical-Surgical

## 2023-03-13 MED ORDER — WEGOVY 1 MG/0.5ML ~~LOC~~ SOAJ: 1.0000 mg | SUBCUTANEOUS | 0 refills | Status: DC

## 2023-03-13 NOTE — Telephone Encounter (Signed)
Per insurance requirements, it looks like we will need to escalate to the next dose.  Wegovy 1 mg weekly has been sent to the pharmacy.

## 2023-03-13 NOTE — Telephone Encounter (Signed)
Patient called in stating she needed another PA for [facility] as stated by the pharmacy.

## 2023-03-13 NOTE — Telephone Encounter (Signed)
Requesting rx rf of wegovy Last written as 0.5mg  03/02/2023 Last OV 12/23/2022 Upcoming appt 03/27/2023

## 2023-03-25 ENCOUNTER — Ambulatory Visit: Payer: BC Managed Care – PPO | Admitting: Medical-Surgical

## 2023-03-27 ENCOUNTER — Ambulatory Visit: Payer: BC Managed Care – PPO | Admitting: Medical-Surgical

## 2023-03-27 ENCOUNTER — Encounter: Payer: Self-pay | Admitting: Medical-Surgical

## 2023-03-27 VITALS — BP 116/73 | HR 70 | Resp 20 | Ht 65.0 in | Wt 154.6 lb

## 2023-03-27 DIAGNOSIS — M10071 Idiopathic gout, right ankle and foot: Secondary | ICD-10-CM | POA: Diagnosis not present

## 2023-03-27 DIAGNOSIS — I10 Essential (primary) hypertension: Secondary | ICD-10-CM | POA: Diagnosis not present

## 2023-03-27 DIAGNOSIS — R635 Abnormal weight gain: Secondary | ICD-10-CM

## 2023-03-27 DIAGNOSIS — K219 Gastro-esophageal reflux disease without esophagitis: Secondary | ICD-10-CM | POA: Diagnosis not present

## 2023-03-27 MED ORDER — COLCHICINE 0.6 MG PO TABS
0.6000 mg | ORAL_TABLET | Freq: Two times a day (BID) | ORAL | 2 refills | Status: AC
Start: 2023-03-27 — End: ?

## 2023-03-27 MED ORDER — HYDROCHLOROTHIAZIDE 25 MG PO TABS
ORAL_TABLET | ORAL | 1 refills | Status: DC
Start: 2023-03-27 — End: 2023-05-27

## 2023-03-27 MED ORDER — OMEPRAZOLE 40 MG PO CPDR
DELAYED_RELEASE_CAPSULE | ORAL | 1 refills | Status: DC
Start: 2023-03-27 — End: 2023-05-27

## 2023-03-27 MED ORDER — WEGOVY 1.7 MG/0.75ML ~~LOC~~ SOAJ: 1.7000 mg | SUBCUTANEOUS | 0 refills | Status: DC

## 2023-03-27 MED ORDER — ATENOLOL 50 MG PO TABS
ORAL_TABLET | ORAL | 1 refills | Status: DC
Start: 2023-03-27 — End: 2023-05-27

## 2023-03-27 NOTE — Progress Notes (Signed)
        Established patient visit  History, exam, impression, and plan:  1. Weight gain Emily Ponce 57 year old female presenting today for weight check.  She is using Wegovy 1 mg weekly, tolerating well without side effects.  Has done this for 2 weeks and has 2 weeks more on this dose.  She continues to see weight loss and has a goal of approximately 140 pounds.  Continues to experience appetite suppression and is making healthy dietary choices.  Exercising sporadically but not as much as she was previously.  Has plans to get this back on track.  Notes that she did have to go 2 weeks without the medication due to pharmacy/insurance approval issues.  Discussed various options.  She would like to increase to 1.7 mg weekly after she completes the next 2 doses and stay there for a couple of months.  Continue dietary modification and work on increasing regular intentional exercise.  2. Essential hypertension History of hypertension currently well-managed with atenolol 50 mg daily and hydrochlorothiazide 25 mg daily.  Blood pressure at goal.  Continue current therapy. - atenolol (TENORMIN) 50 MG tablet; TAKE 1 TABLET(50 MG) BY MOUTH DAILY  Dispense: 90 tablet; Refill: 1 - hydrochlorothiazide (HYDRODIURIL) 25 MG tablet; TAKE 1 TABLET(25 MG) BY MOUTH DAILY  Dispense: 90 tablet; Refill: 1  3. Acute idiopathic gout of right foot Intermittent issues with acute gout of the right foot.  She has not had any recent flares but would like a refill of colchicine to have on hand should this occur.  Sending refill. - colchicine 0.6 MG tablet; Take 1 tablet (0.6 mg total) by mouth 2 (two) times daily.  Dispense: 60 tablet; Refill: 2  4. Gastroesophageal reflux disease, unspecified whether esophagitis present Continues to take omeprazole 40 mg daily which works well to control her reflux symptoms.  Aware of risks of long-term treatment with PPIs.  Continue omeprazole as prescribed. - omeprazole (PRILOSEC) 40 MG capsule;  TAKE 1 CAPSULE(40 MG) BY MOUTH DAILY  Dispense: 90 capsule; Refill: 1  Procedures performed this visit: None.  Return in about 2 months (around 05/27/2023) for weight check.  __________________________________ Thayer Ohm, DNP, APRN, FNP-BC Primary Care and Sports Medicine [facility] [place]

## 2023-04-24 DIAGNOSIS — R92333 Mammographic heterogeneous density, bilateral breasts: Secondary | ICD-10-CM | POA: Diagnosis not present

## 2023-04-24 DIAGNOSIS — Z1231 Encounter for screening mammogram for malignant neoplasm of breast: Secondary | ICD-10-CM | POA: Diagnosis not present

## 2023-04-24 LAB — HM MAMMOGRAPHY

## 2023-04-27 ENCOUNTER — Encounter: Payer: Self-pay | Admitting: Medical-Surgical

## 2023-05-05 ENCOUNTER — Telehealth: Payer: Self-pay | Admitting: Medical-Surgical

## 2023-05-05 DIAGNOSIS — R928 Other abnormal and inconclusive findings on diagnostic imaging of breast: Secondary | ICD-10-CM

## 2023-05-05 NOTE — Telephone Encounter (Signed)
[facility] to verify mammogram orders scheduled on 05-07-23  Please contact [phone]

## 2023-05-05 NOTE — Telephone Encounter (Signed)
Please contact.

## 2023-05-06 NOTE — Telephone Encounter (Signed)
Orders faxed to [facility] at [phone]. Attempted to contact [facility], no answer and unable to leave message.

## 2023-05-06 NOTE — Addendum Note (Signed)
Addended byChristen Butter on: 05/06/2023 02:19 PM   Modules accepted: Orders

## 2023-05-06 NOTE — Telephone Encounter (Signed)
Received incoming call from [facility] requesting orders for 3D additional view of Left beast and Ultrasound order. Patient has an appointment 05/07/23 at 7 am for Diagnostic Mammogram.  [phone]  Fax orders to [phone]  Please advise.

## 2023-05-07 DIAGNOSIS — R92332 Mammographic heterogeneous density, left breast: Secondary | ICD-10-CM | POA: Diagnosis not present

## 2023-05-07 DIAGNOSIS — R928 Other abnormal and inconclusive findings on diagnostic imaging of breast: Secondary | ICD-10-CM | POA: Diagnosis not present

## 2023-05-27 ENCOUNTER — Encounter: Payer: Self-pay | Admitting: Medical-Surgical

## 2023-05-27 ENCOUNTER — Ambulatory Visit: Payer: BC Managed Care – PPO | Admitting: Medical-Surgical

## 2023-05-27 VITALS — BP 138/77 | HR 73 | Resp 20 | Ht 65.0 in | Wt 149.8 lb

## 2023-05-27 DIAGNOSIS — K219 Gastro-esophageal reflux disease without esophagitis: Secondary | ICD-10-CM | POA: Diagnosis not present

## 2023-05-27 DIAGNOSIS — I10 Essential (primary) hypertension: Secondary | ICD-10-CM | POA: Diagnosis not present

## 2023-05-27 DIAGNOSIS — Z7689 Persons encountering health services in other specified circumstances: Secondary | ICD-10-CM | POA: Diagnosis not present

## 2023-05-27 MED ORDER — WEGOVY 1.7 MG/0.75ML ~~LOC~~ SOAJ: 1.7000 mg | SUBCUTANEOUS | 0 refills | Status: DC

## 2023-05-27 MED ORDER — ATENOLOL 50 MG PO TABS
ORAL_TABLET | ORAL | 1 refills | Status: DC
Start: 2023-05-27 — End: 2023-12-04

## 2023-05-27 MED ORDER — HYDROCHLOROTHIAZIDE 25 MG PO TABS
ORAL_TABLET | ORAL | 1 refills | Status: DC
Start: 2023-05-27 — End: 2023-12-01

## 2023-05-27 MED ORDER — OMEPRAZOLE 40 MG PO CPDR
DELAYED_RELEASE_CAPSULE | ORAL | 1 refills | Status: DC
Start: 2023-05-27 — End: 2023-12-04

## 2023-05-27 NOTE — Progress Notes (Signed)
        Established patient visit  History, exam, impression, and plan:  1. Encounter for weight management Very pleasant 57 year old female presenting today for follow-up on weight management.  Is taking Wegovy 1.7 mg weekly, tolerating well without side effects.  Feels that the medication still working well for her.  Has some intermittent constipation but this is manageable.  Occasionally notes chills which can be medication side effect but also could be related to low glucose given her limited dietary intake.  She is making healthy dietary choices and eating smaller portions.  She has done very well with weight loss.  Continue Wegovy 1.7 mg weekly.  Discussed the importance of exercise for maintenance of optimal health and weight loss.  2. Essential hypertension Blood pressure is elevated on arrival today.  She is taking atenolol 50 mg daily and hydrochlorothiazide 25 mg daily.  Notes that she took her medicines less than an hour ago and also got bad news from her sister while she was on the way here.  Recheck of blood pressure 138/77.  Continue atenolol and hydrochlorothiazide as prescribed.  Recommend monitoring blood pressure at home with a goal of 130/80 or less. - atenolol (TENORMIN) 50 MG tablet; TAKE 1 TABLET(50 MG) BY MOUTH DAILY  Dispense: 90 tablet; Refill: 1 - hydrochlorothiazide (HYDRODIURIL) 25 MG tablet; TAKE 1 TABLET(25 MG) BY MOUTH DAILY  Dispense: 90 tablet; Refill: 1  3. Gastroesophageal reflux disease, unspecified whether esophagitis present Taking omeprazole 40 mg daily which works very well for her.  Without it, she has recurrence of GERD symptoms.  Aware of risks of long-term PPI treatment.  Continue omeprazole as prescribed. - omeprazole (PRILOSEC) 40 MG capsule; TAKE 1 CAPSULE(40 MG) BY MOUTH DAILY  Dispense: 90 capsule; Refill: 1  Procedures performed this visit: None.  Return in about 6 months (around 11/24/2023) for weight check or sooner if  needed.  __________________________________ Thayer Ohm, DNP, APRN, FNP-BC Primary Care and Sports Medicine [facility] [place]

## 2023-06-10 DIAGNOSIS — I671 Cerebral aneurysm, nonruptured: Secondary | ICD-10-CM | POA: Diagnosis not present

## 2023-06-28 ENCOUNTER — Encounter: Payer: Self-pay | Admitting: Medical-Surgical

## 2023-06-29 ENCOUNTER — Other Ambulatory Visit: Payer: Self-pay | Admitting: Medical-Surgical

## 2023-06-30 ENCOUNTER — Other Ambulatory Visit: Payer: Self-pay | Admitting: Medical-Surgical

## 2023-07-02 MED ORDER — WEGOVY 1.7 MG/0.75ML ~~LOC~~ SOAJ: 1.7000 mg | SUBCUTANEOUS | 0 refills | Status: DC

## 2023-07-21 ENCOUNTER — Encounter: Payer: Self-pay | Admitting: Medical-Surgical

## 2023-07-23 DIAGNOSIS — I671 Cerebral aneurysm, nonruptured: Secondary | ICD-10-CM | POA: Diagnosis not present

## 2023-07-23 DIAGNOSIS — Z1330 Encounter for screening examination for mental health and behavioral disorders, unspecified: Secondary | ICD-10-CM | POA: Diagnosis not present

## 2023-07-23 MED ORDER — WEGOVY 2.4 MG/0.75ML ~~LOC~~ SOAJ: 2.4000 mg | SUBCUTANEOUS | 3 refills | Status: DC

## 2023-08-06 ENCOUNTER — Encounter: Payer: Self-pay | Admitting: Medical-Surgical

## 2023-08-31 ENCOUNTER — Ambulatory Visit: Payer: BC Managed Care – PPO | Admitting: Medical-Surgical

## 2023-08-31 VITALS — BP 126/77 | HR 79 | Resp 20 | Ht 65.0 in | Wt 146.9 lb

## 2023-08-31 DIAGNOSIS — S0990XA Unspecified injury of head, initial encounter: Secondary | ICD-10-CM | POA: Diagnosis not present

## 2023-08-31 MED ORDER — CYCLOBENZAPRINE HCL 10 MG PO TABS: 5.0000 mg | ORAL_TABLET | Freq: Three times a day (TID) | ORAL | 0 refills | Status: AC | PRN

## 2023-08-31 MED ORDER — HYDROCODONE-ACETAMINOPHEN 5-325 MG PO TABS: 0.5000 | ORAL_TABLET | Freq: Three times a day (TID) | ORAL | 0 refills | Status: DC | PRN

## 2023-08-31 NOTE — Progress Notes (Signed)
        Established patient visit  History, exam, impression, and plan:  1. Traumatic injury of head, initial encounter (Primary) Pleasant 58 year old female presenting today with complaints of a fall that occurred approximately 5 days ago when she was at home. 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 She was only wearing socks and when she went to step, she slipped and fell backwards onto the coffee table where she hit her neck, side of her face, and the back of her head. No LOC and did not seek medical care. Was able to get up and get back to her regular activities that afternoon although a bit sore. She missed work on Thursday due to the fall but was able to go in on Friday. Over the course of Friday to today, she notes that mentally she is somewhat "disconnected" and has been having a strange headache that feels like too much pressure in her head. Also feels unbalanced. 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 No neurological deficits noted today. Speech clear. Gait normal. MAE x 4, equal strength and normal ROM. Does note tenderness/pain to the cervical paraspinal and the trapezius muscles bilaterally worse with movements and when lying down at night. Suspect symptoms are related to muscle spasm and bruising from the fall. Feelings of off balance and disconnect likely from a mild concussion however with her MCA aneurysm history, plan for CT head w/o contrast STAT. Discussed concussion precautions and monitoring. If symptoms worsen, recommend ED evaluation.  - CT HEAD WO CONTRAST ( ); Future  Procedures performed this visit: None.  Return if symptoms worsen or fail to improve.  __________________________________ Thayer Ohm, DNP, APRN, FNP-BC Primary Care and Sports Medicine [facility] [place]

## 2023-09-01 ENCOUNTER — Encounter: Payer: Self-pay | Admitting: Medical-Surgical

## 2023-09-01 ENCOUNTER — Ambulatory Visit: Payer: BC Managed Care – PPO

## 2023-09-01 DIAGNOSIS — S0990XA Unspecified injury of head, initial encounter: Secondary | ICD-10-CM | POA: Diagnosis not present

## 2023-09-01 DIAGNOSIS — G44309 Post-traumatic headache, unspecified, not intractable: Secondary | ICD-10-CM | POA: Diagnosis not present

## 2023-09-01 DIAGNOSIS — S065X0A Traumatic subdural hemorrhage without loss of consciousness, initial encounter: Secondary | ICD-10-CM | POA: Diagnosis not present

## 2023-09-02 ENCOUNTER — Encounter: Payer: Self-pay | Admitting: Medical-Surgical

## 2023-09-02 ENCOUNTER — Telehealth: Payer: Self-pay

## 2023-09-02 NOTE — Telephone Encounter (Signed)
 Spoke with patient . She has read results on Mychart  Seen by patient Emily Ponce on 09/02/2023 11:13 AM  She is requesting a doctors note to be out of work - she states she can not work the way she feels currently.  She has sent provider a Mychart message regarding this.

## 2023-09-02 NOTE — Telephone Encounter (Signed)
 Copied from CRM [id number]. Topic: Clinical - Medical Advice >> Sep 02, 2023  8:08 AM Everette C wrote: Reason for CRM: The patient would like to be contacted to review their CT imaging results from yesterday 09/01/23  Please contact the patient further when possible

## 2023-09-03 NOTE — Telephone Encounter (Signed)
 Note completed and sent to patient via MyChart.

## 2023-09-03 NOTE — Telephone Encounter (Signed)
 Patient states she was out of work yesterday 09/02/23 and today 09/03/23. She would like to be out on Monday, Tuesday, Wednesday of next week and  try to go back to work next Thursday 09/10/23.

## 2023-09-03 NOTE — Telephone Encounter (Signed)
 I will be happy to provide a note for her to give her some time. She requested it be sent by email but this is prohibited due to HIPAA. MyChart message sent regarding this issue. Please contact her to see what dates she has missed from work and an estimated timeline for when she would like to try to go back. With her symptoms, I can certainly justify 1-2 weeks if needed.  Thanks, Ander Slade

## 2023-09-04 DIAGNOSIS — S060X0A Concussion without loss of consciousness, initial encounter: Secondary | ICD-10-CM | POA: Diagnosis not present

## 2023-09-04 DIAGNOSIS — S065XAA Traumatic subdural hemorrhage with loss of consciousness status unknown, initial encounter: Secondary | ICD-10-CM | POA: Diagnosis not present

## 2023-09-04 DIAGNOSIS — Z87891 Personal history of nicotine dependence: Secondary | ICD-10-CM | POA: Diagnosis not present

## 2023-09-04 DIAGNOSIS — Y92019 Unspecified place in single-family (private) house as the place of occurrence of the external cause: Secondary | ICD-10-CM | POA: Diagnosis not present

## 2023-09-04 DIAGNOSIS — S0990XA Unspecified injury of head, initial encounter: Secondary | ICD-10-CM | POA: Diagnosis not present

## 2023-09-04 DIAGNOSIS — S065X0A Traumatic subdural hemorrhage without loss of consciousness, initial encounter: Secondary | ICD-10-CM | POA: Diagnosis not present

## 2023-09-04 DIAGNOSIS — G44319 Acute post-traumatic headache, not intractable: Secondary | ICD-10-CM | POA: Diagnosis not present

## 2023-09-04 DIAGNOSIS — I10 Essential (primary) hypertension: Secondary | ICD-10-CM | POA: Diagnosis not present

## 2023-09-04 DIAGNOSIS — S065X9A Traumatic subdural hemorrhage with loss of consciousness of unspecified duration, initial encounter: Secondary | ICD-10-CM | POA: Diagnosis not present

## 2023-09-04 DIAGNOSIS — W010XXA Fall on same level from slipping, tripping and stumbling without subsequent striking against object, initial encounter: Secondary | ICD-10-CM | POA: Diagnosis not present

## 2023-09-04 DIAGNOSIS — Z888 Allergy status to other drugs, medicaments and biological substances status: Secondary | ICD-10-CM | POA: Diagnosis not present

## 2023-09-04 DIAGNOSIS — Z79899 Other long term (current) drug therapy: Secondary | ICD-10-CM | POA: Diagnosis not present

## 2023-09-04 DIAGNOSIS — R110 Nausea: Secondary | ICD-10-CM | POA: Diagnosis not present

## 2023-09-04 DIAGNOSIS — Z8679 Personal history of other diseases of the circulatory system: Secondary | ICD-10-CM | POA: Diagnosis not present

## 2023-09-10 DIAGNOSIS — S065XAA Traumatic subdural hemorrhage with loss of consciousness status unknown, initial encounter: Secondary | ICD-10-CM | POA: Diagnosis not present

## 2023-10-30 NOTE — Telephone Encounter (Signed)
 The Wegovy  was Approved;Review Type:Prior Auth;Coverage Start Date:07/07/2023;Coverage End Date:08/05/2024;. Authorization Expiration Date: August 05, 2024.

## 2023-11-11 ENCOUNTER — Other Ambulatory Visit: Payer: Self-pay | Admitting: Medical-Surgical

## 2023-11-29 ENCOUNTER — Other Ambulatory Visit: Payer: Self-pay | Admitting: Medical-Surgical

## 2023-11-29 DIAGNOSIS — I10 Essential (primary) hypertension: Secondary | ICD-10-CM

## 2023-12-01 ENCOUNTER — Encounter: Payer: Self-pay | Admitting: Medical-Surgical

## 2023-12-01 MED ORDER — WEGOVY 2.4 MG/0.75ML ~~LOC~~ SOAJ: 2.4000 mg | SUBCUTANEOUS | 2 refills | Status: DC

## 2023-12-01 NOTE — Telephone Encounter (Signed)
 Resent per protocol  wegovy  2.4mg  to requested pharmacy [facility].

## 2023-12-04 ENCOUNTER — Other Ambulatory Visit: Payer: Self-pay

## 2023-12-04 ENCOUNTER — Other Ambulatory Visit: Payer: Self-pay | Admitting: Medical-Surgical

## 2023-12-04 DIAGNOSIS — K219 Gastro-esophageal reflux disease without esophagitis: Secondary | ICD-10-CM

## 2023-12-04 DIAGNOSIS — I10 Essential (primary) hypertension: Secondary | ICD-10-CM

## 2023-12-04 MED ORDER — ATENOLOL 50 MG PO TABS
ORAL_TABLET | ORAL | 0 refills | Status: DC
Start: 2023-12-04 — End: 2023-12-11

## 2023-12-11 ENCOUNTER — Ambulatory Visit (INDEPENDENT_AMBULATORY_CARE_PROVIDER_SITE_OTHER): Admitting: Medical-Surgical

## 2023-12-11 ENCOUNTER — Encounter: Payer: Self-pay | Admitting: Medical-Surgical

## 2023-12-11 VITALS — BP 109/67 | HR 74 | Resp 20 | Ht 65.0 in | Wt 144.1 lb

## 2023-12-11 DIAGNOSIS — Z0000 Encounter for general adult medical examination without abnormal findings: Secondary | ICD-10-CM | POA: Diagnosis not present

## 2023-12-11 DIAGNOSIS — K219 Gastro-esophageal reflux disease without esophagitis: Secondary | ICD-10-CM

## 2023-12-11 DIAGNOSIS — I10 Essential (primary) hypertension: Secondary | ICD-10-CM | POA: Diagnosis not present

## 2023-12-11 MED ORDER — OMEPRAZOLE 20 MG PO CPDR: DELAYED_RELEASE_CAPSULE | ORAL | 3 refills | Status: AC

## 2023-12-11 MED ORDER — ATENOLOL 50 MG PO TABS: ORAL_TABLET | ORAL | 0 refills | Status: DC

## 2023-12-11 MED ORDER — HYDROCHLOROTHIAZIDE 25 MG PO TABS: ORAL_TABLET | ORAL | 0 refills | Status: DC

## 2023-12-11 NOTE — Progress Notes (Signed)
 Complete physical exam  Patient: Emily Ponce   DOB: 03/13/1966   58 y.o. Female  MRN: [id number]  Subjective:     Chief Complaint  Patient presents with   Annual Exam   Medication Refill    Renatta Kuczinski Ivy is a 58 y.o. female who presents today for a complete physical exam. She reports consuming a general diet. Doing regular exercise including strength training.  She generally feels well. She reports sleeping well. She does not have additional problems to discuss today.    Most recent fall risk assessment:    11/25/2022    9:04 AM  Fall Risk   Falls in the past year? 0  Number falls in past yr: 0  Injury with Fall? 0  Risk for fall due to : No Fall Risks  Follow up Falls evaluation completed     Most recent depression screenings:    12/11/2023    3:00 PM 11/25/2022    9:04 AM  PHQ 2/9 Scores  PHQ - 2 Score 0 0    Vision:Within last year and Dental: No current dental problems    Patient Care Team: Cherre Cornish, NP as PCP - General (Nurse Practitioner)   Outpatient Medications Prior to Visit  Medication Sig   allopurinol  (ZYLOPRIM ) 300 MG tablet Take 1 tablet (300 mg total) by mouth daily.   Ascorbic Acid (VITAMIN C ER PO) Take by mouth.   BIOTIN 5000 PO Take 1 tablet by mouth daily.   calcium -vitamin D  (OSCAL WITH D) 500-200 MG-UNIT tablet Take 1 tablet by mouth.   colchicine  0.6 MG tablet Take 1 tablet (0.6 mg total) by mouth 2 (two) times daily.   cyclobenzaprine  (FLEXERIL ) 10 MG tablet Take 0.5-1 tablets (5-10 mg total) by mouth 3 (three) times daily as needed.   Glucosamine HCl (GLUCOSAMINE PO) Take 1 tablet by mouth daily.   HYDROcodone -acetaminophen  (NORCO/VICODIN) 5-325 MG tablet Take 0.5-1 tablets by mouth every 8 (eight) hours as needed for moderate pain (pain score 4-6).   milk thistle 175 MG tablet Take 175 mg by mouth daily.   Omega-3 Fatty Acids (FISH OIL PO) Take 1 capsule by mouth daily.   SELENIUM PO Take by mouth.   WEGOVY  2.4  MG/0.75ML SOAJ Inject 2.4 mg into the skin once a week.   ZINC CITRATE PO Take by mouth.   [DISCONTINUED] atenolol  (TENORMIN ) 50 MG tablet TAKE 1 TABLET(50 MG) BY MOUTH DAILY   [DISCONTINUED] hydrochlorothiazide  (HYDRODIURIL ) 25 MG tablet TAKE 1 TABLET(25 MG) BY MOUTH DAILY   [DISCONTINUED] omeprazole  (PRILOSEC) 40 MG capsule TAKE 1 CAPSULE(40 MG) BY MOUTH DAILY   No facility-administered medications prior to visit.    Review of Systems  Constitutional:  Negative for chills, fever, malaise/fatigue and weight loss.  HENT:  Negative for congestion, ear pain, hearing loss, sinus pain and sore throat.   Eyes:  Negative for blurred vision, photophobia and pain.  Respiratory:  Negative for cough, shortness of breath and wheezing.   Cardiovascular:  Negative for chest pain, palpitations and leg swelling.  Gastrointestinal:  Negative for abdominal pain, constipation, diarrhea, heartburn, nausea and vomiting.  Genitourinary:  Negative for dysuria, frequency and urgency.  Musculoskeletal:  Negative for falls and neck pain.  Skin:  Negative for itching and rash.  Neurological:  Negative for dizziness, weakness and headaches.  Endo/Heme/Allergies:  Negative for polydipsia. Does not bruise/bleed easily.  Psychiatric/Behavioral:  Negative for depression, substance abuse and suicidal ideas. The patient is not nervous/anxious and does not have  insomnia.           Objective:     BP 109/67 (BP Location: Left Arm, Cuff Size: Normal)   Pulse 74   Resp 20   Ht 5\' 5"  (1.651 m)   Wt 144 lb 1.9 oz (65.4 kg)   LMP  (LMP Unknown) Comment: LMP 2017  SpO2 96%   BMI 23.98 kg/m    Physical Exam Vitals reviewed.  Constitutional:      General: She is not in acute distress.    Appearance: Normal appearance. She is normal weight. She is not ill-appearing.  HENT:     Head: Normocephalic and atraumatic.     Right Ear: Tympanic membrane, ear canal and external ear normal. There is no impacted cerumen.      Left Ear: Tympanic membrane, ear canal and external ear normal. There is no impacted cerumen.     Nose: Nose normal. No congestion or rhinorrhea.     Mouth/Throat:     Mouth: Mucous membranes are moist.     Pharynx: No oropharyngeal exudate or posterior oropharyngeal erythema.  Eyes:     General: No scleral icterus.       Right eye: No discharge.        Left eye: No discharge.     Extraocular Movements: Extraocular movements intact.     Conjunctiva/sclera: Conjunctivae normal.     Pupils: Pupils are equal, round, and reactive to light.  Neck:     Thyroid : No thyromegaly.     Vascular: No carotid bruit or JVD.     Trachea: Trachea normal.  Cardiovascular:     Rate and Rhythm: Normal rate and regular rhythm.     Pulses: Normal pulses.     Heart sounds: Normal heart sounds. No murmur heard.    No friction rub. No gallop.  Pulmonary:     Effort: Pulmonary effort is normal. No respiratory distress.     Breath sounds: Normal breath sounds. No wheezing.  Abdominal:     General: Bowel sounds are normal. There is no distension.     Palpations: Abdomen is soft.     Tenderness: There is no abdominal tenderness. There is no guarding.  Musculoskeletal:        General: Normal range of motion.     Cervical back: Normal range of motion and neck supple.  Lymphadenopathy:     Cervical: No cervical adenopathy.  Skin:    General: Skin is warm and dry.  Neurological:     Mental Status: She is alert and oriented to person, place, and time.     Cranial Nerves: No cranial nerve deficit.  Psychiatric:        Mood and Affect: Mood normal.        Behavior: Behavior normal.        Thought Content: Thought content normal.        Judgment: Judgment normal.      No results found for any visits on 12/11/23.     Assessment & Plan:    Routine Health Maintenance and Physical Exam  Immunization History  Administered Date(s) Administered   Tdap 02/08/2009, 07/25/2014   Zoster  Recombinant(Shingrix) 07/27/2019, 10/25/2019    Health Maintenance  Topic Date Due   INFLUENZA VACCINE  02/05/2024   Colonoscopy  06/22/2024   DTaP/Tdap/Td (3 - Td or Tdap) 07/25/2024   MAMMOGRAM  04/23/2025   Cervical Cancer Screening (HPV/Pap Cotest)  02/12/2026   Hepatitis C Screening  Completed   Zoster Vaccines-  Shingrix  Completed   HPV VACCINES  Aged Out   Meningococcal B Vaccine  Aged Out   COVID-19 Vaccine  Discontinued    Discussed health benefits of physical activity, and encouraged her to engage in regular exercise appropriate for her age and condition.  1. Annual physical exam (Primary) Checking labs as below.  Up-to-date on preventative care.  Wellness information provided with AVS. - CBC - CMP14+EGFR - Lipid panel  2. Essential hypertension Stable and well-controlled.  Continue atenolol  and hydrochlorothiazide  as prescribed. - atenolol  (TENORMIN ) 50 MG tablet; TAKE 1 TABLET(50 MG) BY MOUTH DAILY  Dispense: 90 tablet; Refill: 0 - hydrochlorothiazide  (HYDRODIURIL ) 25 MG tablet; TAKE 1 TABLET(25 MG) BY MOUTH DAILY  Dispense: 90 tablet; Refill: 0  3. Gastroesophageal reflux disease, unspecified whether esophagitis present Discussed recommendations for using the lowest effective dose.  We will trial coming down to 20 mg daily. - omeprazole  (PRILOSEC) 20 MG capsule; TAKE 1 CAPSULE(20 MG) BY MOUTH DAILY  Dispense: 901 capsule; Refill: 3   Return in about 6 months (around 06/11/2024) for chronic disease follow up.     Karen Huhta, NP

## 2023-12-11 NOTE — Patient Instructions (Signed)
 Preventive Care 16-60 Years Old, Female  Preventive care refers to lifestyle choices and visits with your health care provider that can promote health and wellness. Preventive care visits are also called wellness exams.  What can I expect for my preventive care visit?  Counseling  Your health care provider may ask you questions about your:  Medical history, including:  Past medical problems.  Family medical history.  Pregnancy history.  Current health, including:  Menstrual cycle.  Method of birth control.  Emotional well-being.  Home life and relationship well-being.  Sexual activity and sexual health.  Lifestyle, including:  Alcohol, nicotine or tobacco, and drug use.  Access to firearms.  Diet, exercise, and sleep habits.  Work and work Astronomer.  Sunscreen use.  Safety issues such as seatbelt and bike helmet use.  Physical exam  Your health care provider will check your:  Height and weight. These may be used to calculate your BMI (body mass index). BMI is a measurement that tells if you are at a healthy weight.  Waist circumference. This measures the distance around your waistline. This measurement also tells if you are at a healthy weight and may help predict your risk of certain diseases, such as type 2 diabetes and high blood pressure.  Heart rate and blood pressure.  Body temperature.  Skin for abnormal spots.  What immunizations do I need?    Vaccines are usually given at various ages, according to a schedule. Your health care provider will recommend vaccines for you based on your age, medical history, and lifestyle or other factors, such as travel or where you work.  What tests do I need?  Screening  Your health care provider may recommend screening tests for certain conditions. This may include:  Lipid and cholesterol levels.  Diabetes screening. This is done by checking your blood sugar (glucose) after you have not eaten for a while (fasting).  Pelvic exam and Pap test.  Hepatitis B test.  Hepatitis C  test.  HIV (human immunodeficiency virus) test.  STI (sexually transmitted infection) testing, if you are at risk.  Lung cancer screening.  Colorectal cancer screening.  Mammogram. Talk with your health care provider about when you should start having regular mammograms. This may depend on whether you have a family history of breast cancer.  BRCA-related cancer screening. This may be done if you have a family history of breast, ovarian, tubal, or peritoneal cancers.  Bone density scan. This is done to screen for osteoporosis.  Talk with your health care provider about your test results, treatment options, and if necessary, the need for more tests.  Follow these instructions at home:  Eating and drinking    Eat a diet that includes fresh fruits and vegetables, whole grains, lean protein, and low-fat dairy products.  Take vitamin and mineral supplements as recommended by your health care provider.  Do not drink alcohol if:  Your health care provider tells you not to drink.  You are pregnant, may be pregnant, or are planning to become pregnant.  If you drink alcohol:  Limit how much you have to 0-1 drink a day.  Know how much alcohol is in your drink. In the [place] one drink equals one 12 oz bottle of beer (355 mL), one 5 oz glass of wine (148 mL), or one 1 oz glass of hard liquor (44 mL).  Lifestyle  Brush your teeth every morning and night with fluoride toothpaste. Floss one time each day.  Exercise for at least  30 minutes 5 or more days each week.  Do not use any products that contain nicotine or tobacco. These products include cigarettes, chewing tobacco, and vaping devices, such as e-cigarettes. If you need help quitting, ask your health care provider.  Do not use drugs.  If you are sexually active, practice safe sex. Use a condom or other form of protection to prevent STIs.  If you do not wish to become pregnant, use a form of birth control. If you plan to become pregnant, see your health care provider for a  prepregnancy visit.  Take aspirin only as told by your health care provider. Make sure that you understand how much to take and what form to take. Work with your health care provider to find out whether it is safe and beneficial for you to take aspirin daily.  Find healthy ways to manage stress, such as:  Meditation, yoga, or listening to music.  Journaling.  Talking to a trusted person.  Spending time with friends and family.  Minimize exposure to UV radiation to reduce your risk of skin cancer.  Safety  Always wear your seat belt while driving or riding in a vehicle.  Do not drive:  If you have been drinking alcohol. Do not ride with someone who has been drinking.  When you are tired or distracted.  While texting.  If you have been using any mind-altering substances or drugs.  Wear a helmet and other protective equipment during sports activities.  If you have firearms in your house, make sure you follow all gun safety procedures.  Seek help if you have been physically or sexually abused.  What's next?  Visit your health care provider once a year for an annual wellness visit.  Ask your health care provider how often you should have your eyes and teeth checked.  Stay up to date on all vaccines.  This information is not intended to replace advice given to you by your health care provider. Make sure you discuss any questions you have with your health care provider.  Document Revised: 12/19/2020 Document Reviewed: 12/19/2020  Elsevier Patient Education  2024 [employer].

## 2024-01-26 ENCOUNTER — Encounter: Payer: Self-pay | Admitting: Medical-Surgical

## 2024-01-26 MED ORDER — WEGOVY 2.4 MG/0.75ML ~~LOC~~ SOAJ: 2.4000 mg | SUBCUTANEOUS | 2 refills | Status: AC

## 2024-03-31 ENCOUNTER — Encounter: Payer: Self-pay | Admitting: Medical-Surgical

## 2024-03-31 MED ORDER — ALPRAZOLAM 0.25 MG PO TABS: 0.2500 mg | ORAL_TABLET | Freq: Every day | ORAL | 0 refills | Status: AC | PRN

## 2024-04-26 DIAGNOSIS — R92333 Mammographic heterogeneous density, bilateral breasts: Secondary | ICD-10-CM | POA: Diagnosis not present

## 2024-04-26 DIAGNOSIS — Z1231 Encounter for screening mammogram for malignant neoplasm of breast: Secondary | ICD-10-CM | POA: Diagnosis not present

## 2024-04-26 LAB — HM MAMMOGRAPHY

## 2024-04-27 ENCOUNTER — Encounter: Payer: Self-pay | Admitting: Medical-Surgical

## 2024-06-07 ENCOUNTER — Other Ambulatory Visit: Payer: Self-pay | Admitting: Medical-Surgical

## 2024-06-07 DIAGNOSIS — I10 Essential (primary) hypertension: Secondary | ICD-10-CM

## 2024-06-09 ENCOUNTER — Other Ambulatory Visit: Payer: Self-pay | Admitting: Medical-Surgical

## 2024-06-09 ENCOUNTER — Other Ambulatory Visit: Payer: Self-pay

## 2024-06-09 DIAGNOSIS — I10 Essential (primary) hypertension: Secondary | ICD-10-CM

## 2024-06-09 MED ORDER — ATENOLOL 50 MG PO TABS: ORAL_TABLET | ORAL | 0 refills | Status: AC

## 2024-06-10 ENCOUNTER — Encounter: Payer: Self-pay | Admitting: Medical-Surgical

## 2024-06-24 ENCOUNTER — Ambulatory Visit: Admitting: Medical-Surgical

## 2024-06-24 ENCOUNTER — Encounter: Payer: Self-pay | Admitting: Medical-Surgical

## 2024-06-24 VITALS — BP 115/71 | HR 71 | Resp 20 | Ht 65.0 in | Wt 144.1 lb

## 2024-06-24 DIAGNOSIS — E782 Mixed hyperlipidemia: Secondary | ICD-10-CM

## 2024-06-24 DIAGNOSIS — K219 Gastro-esophageal reflux disease without esophagitis: Secondary | ICD-10-CM

## 2024-06-24 DIAGNOSIS — I10 Essential (primary) hypertension: Secondary | ICD-10-CM | POA: Diagnosis not present

## 2024-06-24 DIAGNOSIS — Z6828 Body mass index (BMI) 28.0-28.9, adult: Secondary | ICD-10-CM | POA: Diagnosis not present

## 2024-06-24 DIAGNOSIS — E663 Overweight: Secondary | ICD-10-CM

## 2024-06-24 MED ORDER — ATENOLOL 50 MG PO TABS: ORAL_TABLET | ORAL | 3 refills | Status: AC

## 2024-06-24 MED ORDER — HYDROCHLOROTHIAZIDE 25 MG PO TABS: ORAL_TABLET | ORAL | 3 refills | Status: AC

## 2024-06-24 NOTE — Progress Notes (Signed)
 "  Established Patient Office Visit  Subjective   Patient ID: Emily Ponce, female    DOB: 02/01/1966  Age: 58 y.o. MRN: [id number]  No chief complaint on file.   HPI  58 year old female presents for chronic disease follow up.  Essential Hypertension Patient is currently being treated with hydrochlorothiazide  25 mg daily and atenolol  50 mg daily. Patient reports doing well on her current medications. She checks her blood pressures about 5-6 times per week. Average readings at home 120s/70s. Denies having any chest pain, shortness of breath, dizziness, and heart palpitations.  GERD  She is currently being treated with the omeprazole  20 mg daily. Doing well on current regimen. Denies frequent flare ups.  Mixed Hyperlipidemia Patient is currently taking omega 3 fatty acid daily.  Continuing to work on dietary changes  Overweight Patient is currently taking Wegovy  2.4 mg weekly. Doing well on the current dose. Denies having any side effects to her current dosage. Patient states that her employer will no longer cover the Wegovy  prescription after January 1. She states that she has enough to get her through until March  Review of Systems  Constitutional: Negative.   HENT: Negative.    Eyes: Negative.   Respiratory: Negative.    Cardiovascular: Negative.   Gastrointestinal: Negative.   Genitourinary: Negative.   Musculoskeletal: Negative.   Skin: Negative.   Neurological: Negative.   Endo/Heme/Allergies: Negative.   Psychiatric/Behavioral: Negative.        Objective:     LMP  (LMP Unknown) Comment: LMP 2017 BP Readings from Last 3 Encounters:  12/11/23 109/67  08/31/23 126/77  05/27/23 138/77   Wt Readings from Last 3 Encounters:  12/11/23 65.4 kg  08/31/23 66.6 kg  05/27/23 67.9 kg      Physical Exam Vitals and nursing note reviewed.  Constitutional:      General: She is not in acute distress.    Appearance: Normal appearance.  Cardiovascular:     Rate  and Rhythm: Normal rate and regular rhythm.     Pulses: Normal pulses.     Heart sounds: Normal heart sounds.  Pulmonary:     Effort: Pulmonary effort is normal.     Breath sounds: Normal breath sounds.  Neurological:     General: No focal deficit present.     Mental Status: She is alert and oriented to person, place, and time.  Psychiatric:        Mood and Affect: Mood normal.        Behavior: Behavior normal.        Thought Content: Thought content normal.        Judgment: Judgment normal.      No results found for any visits on 06/24/24.  Last CBC Lab Results  Component Value Date   WBC 7.6 10/20/2022   HGB 15.5 10/20/2022   HCT 44.5 10/20/2022   MCV 93.3 10/20/2022   MCH 32.5 10/20/2022   RDW 12.3 10/20/2022   PLT 207 10/20/2022   Last metabolic panel Lab Results  Component Value Date   GLUCOSE 88 10/20/2022   NA 139 10/20/2022   K 3.6 10/20/2022   CL 97 (L) 10/20/2022   CO2 28 10/20/2022   BUN 16 10/20/2022   CREATININE 0.79 10/20/2022   EGFR 87 10/20/2022   CALCIUM  9.6 10/20/2022   PROT 7.7 10/20/2022   ALBUMIN 4.6 11/19/2018   BILITOT 0.7 10/20/2022   ALKPHOS 64 11/19/2018   AST 142 (H) 10/20/2022   ALT  256 (H) 10/20/2022   Last lipids Lab Results  Component Value Date   CHOL 258 (H) 10/20/2022   HDL 70 10/20/2022   LDLCALC 157 (H) 10/20/2022   TRIG 172 (H) 10/20/2022   CHOLHDL 3.7 10/20/2022   Last hemoglobin A1c Lab Results  Component Value Date   HGBA1C 4.9 06/25/2016      The 10-year ASCVD risk score (Arnett DK, et al., 2019) is: 3.3%    Assessment & Plan:  1. Essential hypertension (Primary) -Continue with current regimen -Refills sent to the pharmacy - Lipid Profile - CBC with Differential - CMP14+EGFR - hydrochlorothiazide  (HYDRODIURIL ) 25 MG tablet; TAKE 1 TABLET(25 MG) BY MOUTH DAILY  Dispense: 90 tablet; Refill: 3 - atenolol  (TENORMIN ) 50 MG tablet; TAKE 1 TABLET(50 MG) BY MOUTH DAILY  Dispense: 90 tablet; Refill: 3  2.  Gastroesophageal reflux disease, unspecified whether esophagitis present -Continue with omeprazole  20 mg daily  3. Mixed hyperlipidemia -Continue with Omega 3  -Check levels today - Lipid Profile - CMP14+EGFR  4. Overweight with body mass index (BMI) of 28 to 28.9 in adult -Continue with Wegovy  2.4 mg weekly -Discussed options for medication coverage in the future due to changes with insurance coverage - HgB A1c    Return in about 6 months (around 12/23/2024) for HTN/HLD follow up.    Derrek JINNY Freund, NP Student  "

## 2024-06-25 ENCOUNTER — Ambulatory Visit: Payer: Self-pay | Admitting: Medical-Surgical

## 2024-06-25 LAB — CBC WITH DIFFERENTIAL/PLATELET
Basophils Absolute: 0.1 x10E3/uL (ref 0.0–0.2)
Basos: 1 %
EOS (ABSOLUTE): 0.2 x10E3/uL (ref 0.0–0.4)
Eos: 2 %
Hematocrit: 45.3 % (ref 34.0–46.6)
Hemoglobin: 15.3 g/dL (ref 11.1–15.9)
Immature Grans (Abs): 0 x10E3/uL (ref 0.0–0.1)
Immature Granulocytes: 0 %
Lymphocytes Absolute: 1.6 x10E3/uL (ref 0.7–3.1)
Lymphs: 23 %
MCH: 32.3 pg (ref 26.6–33.0)
MCHC: 33.8 g/dL (ref 31.5–35.7)
MCV: 96 fL (ref 79–97)
Monocytes Absolute: 0.6 x10E3/uL (ref 0.1–0.9)
Monocytes: 8 %
Neutrophils Absolute: 4.5 x10E3/uL (ref 1.4–7.0)
Neutrophils: 66 %
Platelets: 245 x10E3/uL (ref 150–450)
RBC: 4.73 x10E6/uL (ref 3.77–5.28)
RDW: 12 % (ref 11.7–15.4)
WBC: 6.9 x10E3/uL (ref 3.4–10.8)

## 2024-06-25 LAB — SPECIMEN STATUS REPORT

## 2024-06-25 LAB — CMP14+EGFR
ALT: 23 IU/L (ref 0–32)
AST: 29 IU/L (ref 0–40)
Albumin: 4.7 g/dL (ref 3.8–4.9)
Alkaline Phosphatase: 56 IU/L (ref 49–135)
BUN/Creatinine Ratio: 16 (ref 12–28)
BUN: 13 mg/dL (ref 8–27)
Bilirubin Total: 0.7 mg/dL (ref 0.0–1.2)
CO2: 25 mmol/L (ref 20–29)
Calcium: 9.7 mg/dL (ref 8.7–10.3)
Chloride: 91 mmol/L — ABNORMAL LOW (ref 96–106)
Creatinine, Ser: 0.81 mg/dL (ref 0.57–1.00)
Globulin, Total: 2.3 g/dL (ref 1.5–4.5)
Glucose: 85 mg/dL (ref 70–99)
Potassium: 4.2 mmol/L (ref 3.5–5.2)
Sodium: 133 mmol/L — ABNORMAL LOW (ref 134–144)
Total Protein: 7 g/dL (ref 6.0–8.5)
eGFR: 83 mL/min/1.73

## 2024-06-25 LAB — HEMOGLOBIN A1C
Est. average glucose Bld gHb Est-mCnc: 100 mg/dL
Hgb A1c MFr Bld: 5.1 % (ref 4.8–5.6)

## 2024-06-25 LAB — LIPID PANEL
Chol/HDL Ratio: 3.5 ratio (ref 0.0–4.4)
Cholesterol, Total: 233 mg/dL — ABNORMAL HIGH (ref 100–199)
HDL: 66 mg/dL
LDL Chol Calc (NIH): 152 mg/dL — ABNORMAL HIGH (ref 0–99)
Triglycerides: 88 mg/dL (ref 0–149)
VLDL Cholesterol Cal: 15 mg/dL (ref 5–40)

## 2024-06-25 NOTE — Progress Notes (Signed)
 Medical screening examination/treatment was performed by qualified clinical staff member and as supervising provider I was immediately available for consultation/collaboration. I have reviewed documentation and agree with assessment and plan.  Thayer Ohm, DNP, APRN, FNP-BC [facility]

## 2024-07-11 ENCOUNTER — Other Ambulatory Visit (HOSPITAL_COMMUNITY): Payer: Self-pay

## 2024-08-08 ENCOUNTER — Ambulatory Visit: Payer: Self-pay

## 2024-08-08 ENCOUNTER — Ambulatory Visit

## 2024-08-08 ENCOUNTER — Ambulatory Visit
Admission: RE | Admit: 2024-08-08 | Discharge: 2024-08-08 | Disposition: A | Discharge status: Home / Self Care | Source: Home / Self Care

## 2024-08-08 VITALS — BP 132/80 | HR 73 | Temp 98.3°F | Resp 18 | Ht 65.0 in | Wt 142.0 lb

## 2024-08-08 DIAGNOSIS — S4991XA Unspecified injury of right shoulder and upper arm, initial encounter: Secondary | ICD-10-CM | POA: Diagnosis not present

## 2024-08-08 DIAGNOSIS — W19XXXA Unspecified fall, initial encounter: Secondary | ICD-10-CM

## 2024-08-08 DIAGNOSIS — M25511 Pain in right shoulder: Secondary | ICD-10-CM

## 2024-08-08 MED ORDER — OXYCODONE HCL 5 MG PO TABS: 5.0000 mg | ORAL_TABLET | ORAL | 0 refills | Status: AC | PRN

## 2024-08-08 MED ORDER — KETOROLAC TROMETHAMINE 10 MG PO TABS: 10.0000 mg | ORAL_TABLET | Freq: Four times a day (QID) | ORAL | 0 refills | Status: AC | PRN

## 2024-08-08 MED ORDER — KETOROLAC TROMETHAMINE 60 MG/2ML IM SOLN
60.0000 mg | Freq: Once | INTRAMUSCULAR | Status: AC
  Administered 2024-08-08: 60 mg via INTRAMUSCULAR

## 2024-08-08 NOTE — ED Triage Notes (Signed)
 Patient states that she fell going up the stairs on Saturday, injuring her right shoulder and clavicle area.  No LOC.  Patient has taken Advil ,Ibuprofen  and Naproxen.

## 2024-08-08 NOTE — Discharge Instructions (Signed)
 You were seen today for right shoulder pain after a slip and fall. Your X-ray did not show a broken bone or dislocation, but your symptoms and exam are consistent with a mild to moderate injury to the acromioclavicular [facility]) joint, which is where the collarbone meets the shoulder. This type of injury can be very painful but usually heals well with rest and proper care.  To help with pain and healing, keep your right arm in the sling for comfort and support, and avoid using or putting weight on that arm. Apply ice to the shoulder for 15-20 minutes at a time several times a day, especially over the next few days. You were given Toradol  for pain and inflammation--do not take other anti-inflammatory medications such as ibuprofen , naproxen, or aspirin while using it. If needed, you may take Tylenol  (acetaminophen ) 1000 mg every six hours for additional pain relief. This equals two 500 mg tablets at a time. Be careful not to take more than 4000 mg of Tylenol  in a 24-hour period. A very short course of oxycodone  was also prescribed for severe pain; use it only as directed and avoid driving, alcohol, or activities requiring alertness while taking it.  Follow up with an orthopedic specialist for further evaluation and ongoing management of your shoulder injury. Go to the emergency department right away if you develop worsening or severe pain, increasing swelling, a visible deformity, numbness or tingling in the arm or hand, weakness, inability to move the arm, discoloration of the hand or fingers, or any other sudden or concerning symptoms.

## 2024-08-08 NOTE — Telephone Encounter (Signed)
 FYI Only or Action Required?: FYI only for provider: appointment scheduled on 2/2 at [facility].  Patient was last seen in primary care on 06/24/2024 by Willo Mini, NP.  Called Nurse Triage reporting Fall.  Symptoms began several days ago.  Interventions attempted: OTC medications: tylenol  and ibuprofen .  Symptoms are: gradually worsening.  Triage Disposition: See HCP Within 4 Hours (Or PCP Triage)  Patient/caregiver understands and will follow disposition?: Yes  Message from [facility] sent at 08/08/2024  8:10 AM EST  Reason for Triage: Clemens on Saturday, excruciating pain ever since. Has a knot on top of her shoulder, cannot raise her arm to brush her hair. Ice/Heat + Nsaids are not helping. Feels like something is torn.   Reason for Disposition  [1] MODERATE weakness (e.g., interferes with work, school, normal activities) AND [2] new-onset or getting worse  Answer Assessment - Initial Assessment Questions Slipped on ice climbing the stairs back inside- mild swelling to the shoulder with a knot. Can't lift to a 45degree angle with arm. Pain going up the right neck.   Heat/ice-  Ibuprofen , tylenol , ASA-  Took something her husband had to calm him flying with no relief.  Advised ED or UC for assessment- UC appt made for this afternoon. Understands ED precautions  1. MECHANISM: How did the fall happen?     Slipped on ice 2. DOMESTIC VIOLENCE AND ELDER ABUSE SCREENING: Did you fall because someone pushed you or tried to hurt you? If Yes, ask: Are you safe now?     denies 3. ONSET: When did the fall happen? (e.g., minutes, hours, or days ago)     Saturday  4. LOCATION: What part of the body hit the ground? (e.g., back, buttocks, head, hips, knees, hands, head, stomach)     Rigth shoulder 5. INJURY: Did you hurt (injure) yourself when you fell? If Yes, ask: What did you injure? Tell me more about this? (e.g., body area; type of injury; pain severity)     Fells directly on  shoulder 6. PAIN: Is there any pain? If Yes, ask: How bad is the pain? (e.g., Scale 0-10; or none, mild,      20/10 pain - goes up the right side of her neck  7. SIZE: For cuts, bruises, or swelling, ask: How large is it? (e.g., inches or centimeters)      Looks swollen- has knot top  9. OTHER SYMPTOMS: Do you have any other symptoms? (e.g., dizziness, fever, weakness; new-onset or worsening).      Pain causing inability to lift  10. CAUSE: What do you think caused the fall (or falling)? (e.g., dizzy spell, tripped)       Slipped on ice  Protocols used: Falls and [facility]

## 2024-12-16 ENCOUNTER — Encounter: Admitting: Medical-Surgical

## 2024-12-21 ENCOUNTER — Encounter: Admitting: Medical-Surgical
